# Patient Record
Sex: Female | Born: 1985 | Race: Black or African American | Hispanic: No | Marital: Married | Smoking: Never smoker
Health system: Southern US, Community
[De-identification: ages and names within clinical notes are randomized; demographics above are authoritative.]

## PROBLEM LIST (undated history)

## (undated) ENCOUNTER — Inpatient Hospital Stay (HOSPITAL_COMMUNITY): Payer: Self-pay

## (undated) DIAGNOSIS — F329 Major depressive disorder, single episode, unspecified: Secondary | ICD-10-CM

## (undated) DIAGNOSIS — F419 Anxiety disorder, unspecified: Secondary | ICD-10-CM

## (undated) DIAGNOSIS — E7800 Pure hypercholesterolemia, unspecified: Secondary | ICD-10-CM

## (undated) DIAGNOSIS — F32A Depression, unspecified: Secondary | ICD-10-CM

## (undated) DIAGNOSIS — Z8669 Personal history of other diseases of the nervous system and sense organs: Secondary | ICD-10-CM

## (undated) DIAGNOSIS — M9909 Segmental and somatic dysfunction of abdomen and other regions: Secondary | ICD-10-CM

## (undated) DIAGNOSIS — G4700 Insomnia, unspecified: Secondary | ICD-10-CM

## (undated) DIAGNOSIS — T7840XA Allergy, unspecified, initial encounter: Secondary | ICD-10-CM

## (undated) DIAGNOSIS — I10 Essential (primary) hypertension: Secondary | ICD-10-CM

## (undated) DIAGNOSIS — E119 Type 2 diabetes mellitus without complications: Secondary | ICD-10-CM

## (undated) DIAGNOSIS — D649 Anemia, unspecified: Secondary | ICD-10-CM

## (undated) DIAGNOSIS — J4520 Mild intermittent asthma, uncomplicated: Secondary | ICD-10-CM

## (undated) DIAGNOSIS — R0789 Other chest pain: Secondary | ICD-10-CM

## (undated) DIAGNOSIS — M549 Dorsalgia, unspecified: Secondary | ICD-10-CM

## (undated) HISTORY — DX: Mild intermittent asthma, uncomplicated: J45.20

## (undated) HISTORY — DX: Pure hypercholesterolemia, unspecified: E78.00

## (undated) HISTORY — PX: WISDOM TOOTH EXTRACTION: SHX21

## (undated) HISTORY — DX: Segmental and somatic dysfunction of abdomen and other regions: M99.09

## (undated) HISTORY — DX: Anxiety disorder, unspecified: F41.9

## (undated) HISTORY — DX: Depression, unspecified: F32.A

## (undated) HISTORY — DX: Other chest pain: R07.89

## (undated) HISTORY — DX: Allergy, unspecified, initial encounter: T78.40XA

## (undated) HISTORY — DX: Personal history of other diseases of the nervous system and sense organs: Z86.69

## (undated) HISTORY — DX: Dorsalgia, unspecified: M54.9

## (undated) HISTORY — DX: Essential (primary) hypertension: I10

## (undated) HISTORY — DX: Major depressive disorder, single episode, unspecified: F32.9

## (undated) HISTORY — DX: Anemia, unspecified: D64.9

## (undated) HISTORY — DX: Insomnia, unspecified: G47.00

---
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
## 2004-12-14 ENCOUNTER — Ambulatory Visit: Payer: Self-pay | Admitting: Pulmonary Disease

## 2005-10-16 ENCOUNTER — Emergency Department (HOSPITAL_COMMUNITY): Admission: EM | Admit: 2005-10-16 | Discharge: 2005-10-16 | Payer: Self-pay | Admitting: Family Medicine

## 2005-10-21 ENCOUNTER — Emergency Department (HOSPITAL_COMMUNITY): Admission: EM | Admit: 2005-10-21 | Discharge: 2005-10-21 | Payer: Self-pay | Admitting: Family Medicine

## 2005-12-15 ENCOUNTER — Ambulatory Visit: Payer: Self-pay | Admitting: Pulmonary Disease

## 2006-01-19 ENCOUNTER — Ambulatory Visit: Payer: Self-pay | Admitting: Pulmonary Disease

## 2006-04-22 ENCOUNTER — Emergency Department (HOSPITAL_COMMUNITY): Admission: EM | Admit: 2006-04-22 | Discharge: 2006-04-23 | Payer: Self-pay | Admitting: Emergency Medicine

## 2006-12-21 ENCOUNTER — Ambulatory Visit: Payer: Self-pay | Admitting: Pulmonary Disease

## 2006-12-21 LAB — CONVERTED CEMR LAB
ALT: 14 units/L (ref 0–35)
AST: 17 units/L (ref 0–37)
Albumin: 4 g/dL (ref 3.5–5.2)
Bilirubin Urine: NEGATIVE
Bilirubin, Direct: 0.1 mg/dL (ref 0.0–0.3)
Cholesterol: 203 mg/dL (ref 0–200)
Direct LDL: 121.3 mg/dL
HDL: 68.7 mg/dL (ref >39.0)
Hemoglobin, Urine: NEGATIVE
Hemoglobin: 12.9 g/dL (ref 12.0–15.0)
Ketones, ur: NEGATIVE mg/dL
Leukocytes, UA: NEGATIVE
Lymphocytes Relative: 35 % (ref 12.0–46.0)
MCV: 85.7 fL (ref 78.0–100.0)
Monocytes Relative: 10.5 % (ref 3.0–11.0)
Neutrophils Relative %: 51.9 % (ref 43.0–77.0)
Nitrite: NEGATIVE
Platelets: 255 10*3/uL (ref 150–400)
Potassium: 4.1 meq/L (ref 3.5–5.1)
RBC: 4.47 M/uL (ref 3.87–5.11)
RDW: 11.8 % (ref 11.5–14.6)
Sodium: 138 meq/L (ref 135–145)
Total Protein, Urine: NEGATIVE mg/dL
Total Protein: 7.1 g/dL (ref 6.0–8.3)
Triglycerides: 54 mg/dL (ref 0–149)
Urine Glucose: NEGATIVE mg/dL
VLDL: 11 mg/dL (ref 0–40)
pH: 7.5 (ref 5.0–8.0)

## 2007-03-29 DIAGNOSIS — G43909 Migraine, unspecified, not intractable, without status migrainosus: Secondary | ICD-10-CM | POA: Insufficient documentation

## 2007-03-30 ENCOUNTER — Ambulatory Visit: Payer: Self-pay | Admitting: Pulmonary Disease

## 2007-06-29 ENCOUNTER — Inpatient Hospital Stay (HOSPITAL_COMMUNITY): Admission: AD | Admit: 2007-06-29 | Discharge: 2007-06-30 | Payer: Self-pay | Admitting: Obstetrics

## 2007-07-21 ENCOUNTER — Ambulatory Visit (HOSPITAL_COMMUNITY): Admission: RE | Admit: 2007-07-21 | Discharge: 2007-07-21 | Payer: Self-pay | Admitting: Obstetrics

## 2007-09-14 ENCOUNTER — Inpatient Hospital Stay (HOSPITAL_COMMUNITY): Admission: AD | Admit: 2007-09-14 | Discharge: 2007-09-14 | Payer: Self-pay | Admitting: Obstetrics

## 2007-11-22 ENCOUNTER — Inpatient Hospital Stay (HOSPITAL_COMMUNITY): Admission: AD | Admit: 2007-11-22 | Discharge: 2007-11-22 | Payer: Self-pay | Admitting: Obstetrics

## 2007-12-12 ENCOUNTER — Inpatient Hospital Stay (HOSPITAL_COMMUNITY): Admission: AD | Admit: 2007-12-12 | Discharge: 2007-12-12 | Payer: Self-pay | Admitting: Obstetrics

## 2008-02-12 ENCOUNTER — Inpatient Hospital Stay (HOSPITAL_COMMUNITY): Admission: AD | Admit: 2008-02-12 | Discharge: 2008-02-13 | Payer: Self-pay | Admitting: Obstetrics

## 2008-02-17 ENCOUNTER — Inpatient Hospital Stay (HOSPITAL_COMMUNITY): Admission: AD | Admit: 2008-02-17 | Discharge: 2008-02-17 | Payer: Self-pay | Admitting: Obstetrics

## 2008-02-18 ENCOUNTER — Inpatient Hospital Stay (HOSPITAL_COMMUNITY): Admission: AD | Admit: 2008-02-18 | Discharge: 2008-02-20 | Payer: Self-pay | Admitting: Obstetrics

## 2008-03-06 ENCOUNTER — Inpatient Hospital Stay (HOSPITAL_COMMUNITY): Admission: AD | Admit: 2008-03-06 | Discharge: 2008-03-07 | Payer: Self-pay | Admitting: Obstetrics

## 2008-10-21 ENCOUNTER — Ambulatory Visit: Payer: Self-pay | Admitting: Pulmonary Disease

## 2008-10-27 DIAGNOSIS — E7800 Pure hypercholesterolemia, unspecified: Secondary | ICD-10-CM

## 2008-10-27 DIAGNOSIS — F341 Dysthymic disorder: Secondary | ICD-10-CM

## 2008-10-27 DIAGNOSIS — M549 Dorsalgia, unspecified: Secondary | ICD-10-CM

## 2008-10-27 DIAGNOSIS — J4520 Mild intermittent asthma, uncomplicated: Secondary | ICD-10-CM

## 2008-10-27 DIAGNOSIS — J309 Allergic rhinitis, unspecified: Secondary | ICD-10-CM | POA: Insufficient documentation

## 2008-10-27 DIAGNOSIS — M999 Biomechanical lesion, unspecified: Secondary | ICD-10-CM

## 2008-10-27 DIAGNOSIS — R0789 Other chest pain: Secondary | ICD-10-CM

## 2008-10-27 HISTORY — DX: Mild intermittent asthma, uncomplicated: J45.20

## 2008-10-27 LAB — CONVERTED CEMR LAB
ALT: 11 units/L (ref 0–35)
Albumin: 4.3 g/dL (ref 3.5–5.2)
Basophils Absolute: 0 10*3/uL (ref 0.0–0.1)
Basophils Relative: 0.4 % (ref 0.0–3.0)
CO2: 27 meq/L (ref 19–32)
Cholesterol: 226 mg/dL — ABNORMAL HIGH (ref 0–200)
Eosinophils Absolute: 0.1 10*3/uL (ref 0.0–0.7)
Eosinophils Relative: 1 % (ref 0.0–5.0)
GFR calc non Af Amer: 132.72 mL/min (ref >60)
Glucose, Bld: 89 mg/dL (ref 70–99)
HCT: 37.8 % (ref 36.0–46.0)
HDL: 67.8 mg/dL (ref >39.00)
Hemoglobin: 12.9 g/dL (ref 12.0–15.0)
Ketones, ur: NEGATIVE mg/dL
Lymphocytes Relative: 25.7 % (ref 12.0–46.0)
Lymphs Abs: 2.7 10*3/uL (ref 0.7–4.0)
MCHC: 34.2 g/dL (ref 30.0–36.0)
Monocytes Absolute: 1 10*3/uL (ref 0.1–1.0)
Monocytes Relative: 9.7 % (ref 3.0–12.0)
Neutrophils Relative %: 63.2 % (ref 43.0–77.0)
Nitrite: NEGATIVE
RDW: 11.9 % (ref 11.5–14.6)
Sodium: 133 meq/L — ABNORMAL LOW (ref 135–145)
Specific Gravity, Urine: >=1.03 (ref 1.000–1.030)
TSH: 1.18 microintl units/mL (ref 0.35–5.50)
Total CHOL/HDL Ratio: 3
Total Protein: 7.5 g/dL (ref 6.0–8.3)
Triglycerides: 72 mg/dL (ref 0.0–149.0)
Urine Glucose: NEGATIVE mg/dL
Urobilinogen, UA: 0.2 (ref 0.0–1.0)
WBC: 10.6 10*3/uL — ABNORMAL HIGH (ref 4.5–10.5)

## 2009-01-18 ENCOUNTER — Inpatient Hospital Stay (HOSPITAL_COMMUNITY): Admission: AD | Admit: 2009-01-18 | Discharge: 2009-01-19 | Payer: Self-pay | Admitting: Obstetrics

## 2009-02-08 ENCOUNTER — Inpatient Hospital Stay (HOSPITAL_COMMUNITY): Admission: AD | Admit: 2009-02-08 | Discharge: 2009-02-08 | Payer: Self-pay | Admitting: Obstetrics

## 2009-03-03 ENCOUNTER — Ambulatory Visit (HOSPITAL_COMMUNITY): Admission: RE | Admit: 2009-03-03 | Discharge: 2009-03-03 | Payer: Self-pay | Admitting: Obstetrics

## 2009-04-26 ENCOUNTER — Ambulatory Visit: Payer: Self-pay | Admitting: Advanced Practice Midwife

## 2009-04-26 ENCOUNTER — Inpatient Hospital Stay (HOSPITAL_COMMUNITY): Admission: AD | Admit: 2009-04-26 | Discharge: 2009-04-26 | Payer: Self-pay | Admitting: Obstetrics

## 2009-05-13 ENCOUNTER — Inpatient Hospital Stay (HOSPITAL_COMMUNITY): Admission: AD | Admit: 2009-05-13 | Discharge: 2009-05-13 | Payer: Self-pay | Admitting: Obstetrics

## 2009-05-25 ENCOUNTER — Inpatient Hospital Stay (HOSPITAL_COMMUNITY): Admission: AD | Admit: 2009-05-25 | Discharge: 2009-05-25 | Payer: Self-pay | Admitting: Obstetrics

## 2009-05-25 ENCOUNTER — Ambulatory Visit: Payer: Self-pay | Admitting: Advanced Practice Midwife

## 2009-06-04 ENCOUNTER — Inpatient Hospital Stay (HOSPITAL_COMMUNITY): Admission: AD | Admit: 2009-06-04 | Discharge: 2009-06-05 | Payer: Self-pay | Admitting: Obstetrics

## 2009-06-26 ENCOUNTER — Inpatient Hospital Stay (HOSPITAL_COMMUNITY): Admission: AD | Admit: 2009-06-26 | Discharge: 2009-06-27 | Payer: Self-pay | Admitting: Obstetrics

## 2009-06-27 ENCOUNTER — Inpatient Hospital Stay (HOSPITAL_COMMUNITY): Admission: AD | Admit: 2009-06-27 | Discharge: 2009-06-27 | Payer: Self-pay | Admitting: Obstetrics

## 2009-06-28 ENCOUNTER — Inpatient Hospital Stay (HOSPITAL_COMMUNITY): Admission: AD | Admit: 2009-06-28 | Discharge: 2009-06-30 | Payer: Self-pay | Admitting: Obstetrics

## 2009-07-30 ENCOUNTER — Emergency Department (HOSPITAL_COMMUNITY): Admission: EM | Admit: 2009-07-30 | Discharge: 2009-07-30 | Payer: Self-pay | Admitting: Emergency Medicine

## 2009-07-31 ENCOUNTER — Telehealth (INDEPENDENT_AMBULATORY_CARE_PROVIDER_SITE_OTHER): Payer: Self-pay | Admitting: *Deleted

## 2009-08-04 ENCOUNTER — Telehealth: Payer: Self-pay | Admitting: Pulmonary Disease

## 2009-08-11 ENCOUNTER — Encounter: Payer: Self-pay | Admitting: Pulmonary Disease

## 2009-08-11 ENCOUNTER — Telehealth (INDEPENDENT_AMBULATORY_CARE_PROVIDER_SITE_OTHER): Payer: Self-pay | Admitting: *Deleted

## 2009-08-12 ENCOUNTER — Encounter: Payer: Self-pay | Admitting: Pulmonary Disease

## 2009-09-25 ENCOUNTER — Telehealth: Payer: Self-pay | Admitting: Pulmonary Disease

## 2009-09-25 ENCOUNTER — Telehealth (INDEPENDENT_AMBULATORY_CARE_PROVIDER_SITE_OTHER): Payer: Self-pay | Admitting: *Deleted

## 2009-09-25 ENCOUNTER — Encounter: Payer: Self-pay | Admitting: Pulmonary Disease

## 2009-09-29 ENCOUNTER — Ambulatory Visit: Payer: Self-pay | Admitting: Pulmonary Disease

## 2009-09-29 DIAGNOSIS — I10 Essential (primary) hypertension: Secondary | ICD-10-CM | POA: Insufficient documentation

## 2009-11-03 ENCOUNTER — Ambulatory Visit: Payer: Self-pay | Admitting: Pulmonary Disease

## 2009-11-20 ENCOUNTER — Ambulatory Visit: Payer: Self-pay | Admitting: Pulmonary Disease

## 2009-11-24 DIAGNOSIS — R21 Rash and other nonspecific skin eruption: Secondary | ICD-10-CM | POA: Insufficient documentation

## 2009-12-31 ENCOUNTER — Ambulatory Visit: Payer: Self-pay | Admitting: Pulmonary Disease

## 2010-01-03 DIAGNOSIS — K5900 Constipation, unspecified: Secondary | ICD-10-CM | POA: Insufficient documentation

## 2010-01-03 LAB — CONVERTED CEMR LAB
AST: 17 units/L (ref 0–37)
Basophils Absolute: 0 10*3/uL (ref 0.0–0.1)
Calcium: 9.3 mg/dL (ref 8.4–10.5)
Cholesterol: 212 mg/dL — ABNORMAL HIGH (ref 0–200)
Creatinine, Ser: 0.6 mg/dL (ref 0.4–1.2)
Eosinophils Absolute: 0.1 10*3/uL (ref 0.0–0.7)
Eosinophils Relative: 1 % (ref 0.0–5.0)
GFR calc non Af Amer: 148.38 mL/min (ref >60)
Glucose, Bld: 85 mg/dL (ref 70–99)
Ketones, ur: NEGATIVE mg/dL
Lymphocytes Relative: 30.5 % (ref 12.0–46.0)
Monocytes Absolute: 0.8 10*3/uL (ref 0.1–1.0)
Monocytes Relative: 8.6 % (ref 3.0–12.0)
Neutro Abs: 5.2 10*3/uL (ref 1.4–7.7)
Nitrite: NEGATIVE
RBC: 4.3 M/uL (ref 3.87–5.11)
RDW: 12.9 % (ref 11.5–14.6)
Specific Gravity, Urine: 1.02 (ref 1.000–1.030)
Total Protein, Urine: NEGATIVE mg/dL
Total Protein: 7.2 g/dL (ref 6.0–8.3)
Urobilinogen, UA: 0.2 (ref 0.0–1.0)
pH: 6.5 (ref 5.0–8.0)

## 2010-05-20 ENCOUNTER — Encounter: Payer: Self-pay | Admitting: Pulmonary Disease

## 2010-05-31 ENCOUNTER — Encounter: Payer: Self-pay | Admitting: Obstetrics
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
## 2010-06-09 NOTE — Progress Notes (Signed)
Summary: note / return to work  Phone Note Call from Patient   Caller: Mom Call For: Megan Hunt Summary of Call: pt's mom needs to speak to nurse re: note for work. since pt has a return visit w/ sn shd'd for mond- should pt stay out at least until next tuesday? mom says she will need a new note to take to HR at work if this is the case. call Richardo Hanks at [phone] Initial call taken by: Tivis Ringer, CNA,  Sep 25, 2009 3:10 PM  Follow-up for Phone Call        Pt is schedueld to see Sn on Monday in regards to high BP and pt mother is asking for work note to keep pt out of work until Tuesday, after appt with SN.  Pt was given a note from [facility] clinic that last through today, but since pt cannot see SN until mOnday mother rquesting an extension. Pelase advise.[name] CMA  Sep 25, 2009 3:15 PM   Additional Follow-up for Phone Call Additional follow up Details #1::        per SN---ok for note to extend out of work until appt on tuesday.  this has been printed and her mother is aware of note being left up front for pick up. Randell Loop CMA  Sep 25, 2009 4:41 PM
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
## 2010-06-09 NOTE — Progress Notes (Signed)
Summary: med not helping headaches  Phone Note Call from Patient   Caller: mother-[phone] Call For: Estevan Kersh Reason for Call: Talk to Nurse Summary of Call: Dr. Kriste Basque prescribed a med last week for headaches.  Patient's mother calling today saying the med is not helping her headaches and needs something stronger.  [facility] [address] Initial call taken by: Lehman Prom,  August 04, 2009 12:20 PM  Follow-up for Phone Call        dr Kriste Basque gave tramadol for headaches since midrin is no longer available, pls advise if there is anything else pt states the tramadol is not working Follow-up by: Philipp Deputy CMA,  August 04, 2009 1:48 PM  Additional Follow-up for Phone Call Additional follow up Details #1::        per SN---call in vicodin 5/500mg   #50  1 by mouth every 6 hours as needed for pain   she will need appt with [facility] clinic for eval---this is mandatory so they can follow up with her ha.  thanks Randell Loop CMA  August 04, 2009 2:36 PM   rx called in and order palced for HA clinic referral. pt mother advised. [name] CMA  August 04, 2009 2:45 PM     New/Updated Medications: VICODIN 5-500 MG TABS (HYDROCODONE-ACETAMINOPHEN) one every 6 hours as needed for pain Prescriptions: VICODIN 5-500 MG TABS (HYDROCODONE-ACETAMINOPHEN) one every 6 hours as needed for pain  #50 x 0   Entered by:   [name] CMA   Authorized by:   Michele Mcalpine MD   Signed by:   [name] CMA on 08/04/2009   Method used:   Telephoned to ...       [facility] [id number] (retail)       [address] [place], [place]  [address]       Ph: [phone]       Fax: [phone]   RxID:   [id number]

## 2010-06-09 NOTE — Letter (Signed)
Summary: Out of Work  [employer]  [address]   [place], [place] [address]   Phone: [phone]  Fax: [phone]    August 12, 2009   Employee:  Abbott Pao    To Whom It May Concern:   For Medical reasons, please excuse the above named employee from work for the following dates:  Start:   Monday August 11, 2009  End:   Thursday Sep 25, 2009  If you need additional information, please feel free to contact our office.         Sincerely,    Alroy Dust, M.D.
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
## 2010-06-09 NOTE — Letter (Signed)
Summary: Out of Work  [employer]  [address]   [place], [place] [address]   Phone: [phone]  Fax: [phone]    Sep 29, 2009   Employee:  DASHANTI BURR    To Whom It May Concern:   For Medical reasons, please excuse the above named employee from work for the following dates:  Start:   09-29-2009  End:   09-30-2009    Ok to return to work from her LOA on 09-30-2009.         Sincerely,       Lonzo Cloud. Elayne Snare. D.

## 2010-06-09 NOTE — Progress Notes (Signed)
Summary: appointment  Phone Note Call from Patient Call back at [phone]   Caller: mother Call For: nadel Summary of Call: pt would like ov with dr Kriste Basque for elevated b/p does not want to see tammy Initial call taken by: Rickard Patience,  Sep 25, 2009 11:31 AM  Follow-up for Phone Call        Pt has had HA medication increased by [facility] clinic, and they are requiring her to be on BCP while on the increased dose because she cannot get pregnant while on this medication. But her BP was elevated so they want her to f/u with SN first to address increased BP before her OB/GYN will place her on birth control. Please advise on work in slot. Thanks. [name] CMA  Sep 25, 2009 11:54 AM   Additional Follow-up for Phone Call Additional follow up Details #1::        SN has an opening on 5-23 at 2:30 if she wants to come in that day.  thanks Randell Loop CMA  Sep 25, 2009 1:11 PM   Spoke with the pt's mother and she will inform pt of appt on Monday, 09/29/2009 @ 2:30pm.Lori Excell Seltzer CMA  Sep 25, 2009 1:37 PM

## 2010-06-09 NOTE — Progress Notes (Signed)
Summary: migraines/ rx request  Phone Note Call from Patient   Caller: Mom Call For: nadel Summary of Call: per pt's mom janice, pt c/o migraines x 2 wks. pt went to ed and got a shot for this. it eased off but has returned today. requests a rx for this. says dr Kriste Basque gave pt meds for this "sometime bacK". pt just had a baby a months ago- also has a 72 month old. [facility] on [address] and [address]. janice at [phone] Initial call taken by: Tivis Ringer, CNA,  July 31, 2009 9:52 AM  Follow-up for Phone Call        no migraine meds listed in pt's current med list.  Please advise.  Thank you.  Aundra Millet Reynolds LPN  July 31, 2009 1:47 PM   per SN---  Additional Follow-up for Phone Call Additional follow up Details #1::        per SN---she has the alprazolam that she can use for her nerves and the muscle spasms---midrin worked for her in the past but this is no longer avaliable.  therefore SN rec.---tramadol 50mg   1 by mouth three times a day as needed for pain  #50 with 5 refill.  she can take the tylenol 2 tabs with the tramadol.  thanks Randell Loop CMA  July 31, 2009 3:53 PM     Additional Follow-up for Phone Call Additional follow up Details #2::    Spoke with pt's mother and advised of SN's recs.  Rx refill for alprazolam was called in as well as new rx for tramadol. Follow-up by: Vernie Murders,  July 31, 2009 4:52 PM  New/Updated Medications: TRAMADOL HCL 50 MG TABS (TRAMADOL HCL) 1 by mouth three times a day as needed for pain Prescriptions: TRAMADOL HCL 50 MG TABS (TRAMADOL HCL) 1 by mouth three times a day as needed for pain  #50 x 5   Entered by:   Vernie Murders   Authorized by:   Michele Mcalpine MD   Signed by:   Vernie Murders on 07/31/2009   Method used:   Telephoned to ...       [facility] [id number]* (retail)       [address] [place], [place]  [address]       Ph: [phone]       Fax: [phone]   RxID:   [id number] ALPRAZOLAM 0.5 MG  TABS  (ALPRAZOLAM) take 1/2 to 1 tab by mouth three times a day as needed for nerves...  #90 x 0   Entered by:   Vernie Murders   Authorized by:   Michele Mcalpine MD   Signed by:   Vernie Murders on 07/31/2009   Method used:   Telephoned to ...       [facility] [id number]* (retail)       [address] [place], [place]  [address]       Ph: [phone]       Fax: [phone]   RxID:   [id number]

## 2010-06-09 NOTE — Letter (Signed)
Summary: Out of Work  [facility]  [address]   [place], [place] [address]   Phone: [phone]  Fax: [phone]    Sep 25, 2009   Employee:  Megan Hunt    To Whom It May Concern:   For Medical reasons, please excuse the above named employee from work for the following dates:  Start:   09-25-2009  End:   09-30-2009  If you need additional information, please feel free to contact our office.         Sincerely,       Lonzo Cloud. Elayne Snare . D.

## 2010-06-09 NOTE — Letter (Signed)
Summary: [facility]  [facility]   Imported By: Sherian Rein 08/20/2009 12:01:02  _____________________________________________________________________  External Attachment:    Type:   Image     Comment:   External Document

## 2010-06-09 NOTE — Progress Notes (Signed)
Summary: speak to nurse-Letter out of work  Phone Note Call from Patient   Caller: Mom Call For: nadel Summary of Call: pt's mom janice wants to speak to nurse. has some questions mom was told to ask since pt was seen by neurologist. Liborio Nixon [phone] Initial call taken by: Tivis Ringer, CNA,  August 11, 2009 4:26 PM  Follow-up for Phone Call        ATC number given, no answer and unable to leave message, states mailbox not set up. WCB. [name] CMA  August 11, 2009 4:41 PM  pt called back, asked that nurse call her back.  Will keep cell phone with her.  Follow-up by: Eugene Gavia,  August 11, 2009 4:53 PM  Additional Follow-up for Phone Call Additional follow up Details #1::        called and spoke with pt's mother, Liborio Nixon.  Liborio Nixon states pt was just seen today by [facility].  They have started her on Topamax and Baclofen.  However, d/t the side effects of these meds ( drowsiness, dizziness, weakness, difficulty concentrating, tingling/numbness in hands and feet) they recommend pt be out of work for at least 1 month until she can get used to the medications.  Mother states the [facility] states they do not write notes for pt to be out of work- this is to be done through [facility].  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  Pt's next appt with [facility] is 09-25-2009 and pt requests to be out of work until her next appt with them.  Please advise.  Thanks.  Aundra Millet Reynolds LPN  August 12, 4538 5:07 PM     Additional Follow-up for Phone Call Additional follow up Details #2::    per SN---we can write the note for her to be out of work for this time period.  thanks Randell Loop CMA  August 12, 2009 8:53 AM   printed letter.  called and spoke with pt's mother and informed her work note ready.  mother will pick up letter today.  Aundra Millet Reynolds LPN  August 12, 9809 9:16 AM
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
## 2010-06-25 NOTE — Letter (Signed)
Summary: [facility]   Imported By: Sherian Rein 06/15/2010 09:59:30  _____________________________________________________________________  External Attachment:    Type:   Image     Comment:   External Document

## 2010-07-26 LAB — URINALYSIS, ROUTINE W REFLEX MICROSCOPIC
Nitrite: NEGATIVE
Protein, ur: NEGATIVE mg/dL
Urobilinogen, UA: 0.2 mg/dL (ref 0.0–1.0)

## 2010-07-29 LAB — CBC
HCT: 32 % — ABNORMAL LOW (ref 36.0–46.0)
HCT: 34.5 % — ABNORMAL LOW (ref 36.0–46.0)
Hemoglobin: 10.6 g/dL — ABNORMAL LOW (ref 12.0–15.0)
Hemoglobin: 11.4 g/dL — ABNORMAL LOW (ref 12.0–15.0)
MCV: 87.2 fL (ref 78.0–100.0)
RBC: 3.64 MIL/uL — ABNORMAL LOW (ref 3.87–5.11)
RDW: 13.4 % (ref 11.5–15.5)
WBC: 13 10*3/uL — ABNORMAL HIGH (ref 4.0–10.5)

## 2010-07-29 LAB — COMPREHENSIVE METABOLIC PANEL
ALT: 13 U/L (ref 0–35)
AST: 17 U/L (ref 0–37)
Alkaline Phosphatase: 190 U/L — ABNORMAL HIGH (ref 39–117)
CO2: 21 mEq/L (ref 19–32)
Chloride: 105 mEq/L (ref 96–112)
GFR calc Af Amer: >60 mL/min (ref >60)
GFR calc non Af Amer: >60 mL/min (ref >60)
Glucose, Bld: 106 mg/dL — ABNORMAL HIGH (ref 70–99)
Potassium: 3.8 mEq/L (ref 3.5–5.1)
Total Protein: 5.8 g/dL — ABNORMAL LOW (ref 6.0–8.3)

## 2010-07-29 LAB — RPR: RPR Ser Ql: NONREACTIVE

## 2010-07-29 LAB — LACTATE DEHYDROGENASE: LDH: 125 U/L (ref 94–250)

## 2010-08-10 LAB — GC/CHLAMYDIA PROBE AMP, GENITAL
Chlamydia, DNA Probe: NEGATIVE
GC Probe Amp, Genital: NEGATIVE

## 2010-08-10 LAB — URINALYSIS, ROUTINE W REFLEX MICROSCOPIC: Urobilinogen, UA: 0.2 mg/dL (ref 0.0–1.0)

## 2010-08-13 LAB — DIFFERENTIAL
Eosinophils Relative: 1 % (ref 0–5)
Lymphocytes Relative: 21 % (ref 12–46)
Monocytes Absolute: 1.2 10*3/uL — ABNORMAL HIGH (ref 0.1–1.0)
Monocytes Relative: 8 % (ref 3–12)
Neutro Abs: 10 10*3/uL — ABNORMAL HIGH (ref 1.7–7.7)
Neutrophils Relative %: 69 % (ref 43–77)

## 2010-08-13 LAB — COMPREHENSIVE METABOLIC PANEL
AST: 21 U/L (ref 0–37)
Alkaline Phosphatase: 91 U/L (ref 39–117)
BUN: 5 mg/dL — ABNORMAL LOW (ref 6–23)
Calcium: 9.3 mg/dL (ref 8.4–10.5)
Chloride: 109 mEq/L (ref 96–112)
GFR calc Af Amer: >60 mL/min (ref >60)
GFR calc non Af Amer: >60 mL/min (ref >60)
Glucose, Bld: 101 mg/dL — ABNORMAL HIGH (ref 70–99)
Potassium: 3.2 mEq/L — ABNORMAL LOW (ref 3.5–5.1)
Sodium: 135 mEq/L (ref 135–145)

## 2010-08-13 LAB — CBC
Hemoglobin: 11.6 g/dL — ABNORMAL LOW (ref 12.0–15.0)
MCHC: 33.6 g/dL (ref 30.0–36.0)
RBC: 4 MIL/uL (ref 3.87–5.11)
RDW: 12.9 % (ref 11.5–15.5)

## 2010-08-14 LAB — URINE CULTURE

## 2010-08-14 LAB — CBC
HCT: 32.6 % — ABNORMAL LOW (ref 36.0–46.0)
Platelets: 235 10*3/uL (ref 150–400)
RDW: 13 % (ref 11.5–15.5)
WBC: 13 10*3/uL — ABNORMAL HIGH (ref 4.0–10.5)

## 2010-08-14 LAB — URINALYSIS, ROUTINE W REFLEX MICROSCOPIC
Glucose, UA: NEGATIVE mg/dL
Nitrite: NEGATIVE
Specific Gravity, Urine: 1.025 (ref 1.005–1.030)
pH: 5.5 (ref 5.0–8.0)

## 2010-08-14 LAB — URINE MICROSCOPIC-ADD ON: RBC / HPF: NONE SEEN RBC/hpf (ref <3)

## 2010-09-22 NOTE — H&P (Signed)
NAME:  Megan Hunt, Megan Hunt NO.:  [id number]   MEDICAL RECORD NO.:  [id number]          PATIENT TYPE:  INP   LOCATION:  9168                          FACILITY:  [facility]   PHYSICIAN:  Roseanna Rainbow, M.D.DATE OF BIRTH:  12/23/1985   DATE OF ADMISSION:  02/18/2008  DATE OF DISCHARGE:                              HISTORY & PHYSICAL   CHIEF COMPLAINT:  The patient is a 25 year old para 0 with an estimated  date of confinement of February 22, 2008 with an intrauterine pregnancy  at 39 plus weeks complaining of bloody show and leaking fluid.   HISTORY OF PRESENT ILLNESS:  Please see the above.  The patient reports  onset of leaking of fluid for several hours prior to presentation.  The  fluid is clear.  She has regular contractions.   SOCIAL HISTORY:  She is single.  She denies any tobacco or ethanol use.   ALLERGIES:  No known drug allergies.   GYNECOLOGIC HISTORY:  Normal triad.   PAST MEDICAL HISTORY:  Asthma.   PAST SURGICAL HISTORY:  She denies.   FAMILY HISTORY:  Noncontributory.   OB RISK FACTORS:  Asthma.  She did have some labile blood pressures 130s-  150s/70s.   PRENATAL LABORATORY DATA:  Hemoglobin 11.9, hematocrit 36.4, platelets  261,000, blood type B+, antibody screen negative, sickle cell negative,  RPR nonreactive, rubella immune, hepatitis B surface antigen negative,  HIV nonreactive, PPD negative, Pap test negative, GC negative, chlamydia  negative.  The quad screen is unclear if it was performed.  The results  are not on the chart.  A 1-hour GTT 108.  GBS negative on January 17, 2008.  Early ultrasound with an estimated date of confinement of February 27, 2008 at every 3 days.  Ultrasound at 20 weeks 5 days, posterior  placenta, no previa.   REVIEW OF SYSTEMS:  GU:  Please see the above.   PHYSICAL EXAMINATION:  VITAL SIGNS:  Stable, afebrile.  Fetal heart  tracing reassuring.  Tocodynamometer irregular uterine contractions.  Sterile  vaginal exam  per the RN, there is ferning.  There is no blood appreciated.  Nitrazine  is positive as well.  Cervix is 1 cm dilated.   ASSESSMENT:  Primigravida at term, prodromal labor.  Fetal heart tracing  consistent with fetal well being.   PLAN:  Admission.  Augment labor with low-dose Pitocin per protocol.  Monitor progress.      Roseanna Rainbow, M.D.  Electronically Signed     LAJ/MEDQ  D:  02/18/2008  T:  02/18/2008  Job:  [id number]

## 2010-09-29 ENCOUNTER — Telehealth: Payer: Self-pay | Admitting: Pulmonary Disease

## 2010-09-29 MED ORDER — ALBUTEROL SULFATE HFA 108 (90 BASE) MCG/ACT IN AERS: 2.0000 | INHALATION_SPRAY | Freq: Four times a day (QID) | RESPIRATORY_TRACT | Status: DC | PRN

## 2010-09-29 NOTE — Telephone Encounter (Signed)
Spoke with pt's mother Liborio Nixon. She states pt needs refill on proventil. Rx was sent to pharm. Nothing further needed.

## 2011-01-04 ENCOUNTER — Encounter: Payer: Self-pay | Admitting: Pulmonary Disease

## 2011-01-05 ENCOUNTER — Ambulatory Visit (INDEPENDENT_AMBULATORY_CARE_PROVIDER_SITE_OTHER): Payer: BC Managed Care – PPO | Admitting: Pulmonary Disease

## 2011-01-05 ENCOUNTER — Encounter: Payer: Self-pay | Admitting: Pulmonary Disease

## 2011-01-05 ENCOUNTER — Other Ambulatory Visit (INDEPENDENT_AMBULATORY_CARE_PROVIDER_SITE_OTHER): Payer: BC Managed Care – PPO

## 2011-01-05 VITALS — BP 142/78 | HR 91 | Temp 97.8°F | Ht 65.0 in | Wt 170.6 lb

## 2011-01-05 DIAGNOSIS — Z0000 Encounter for general adult medical examination without abnormal findings: Secondary | ICD-10-CM

## 2011-01-05 DIAGNOSIS — I10 Essential (primary) hypertension: Secondary | ICD-10-CM

## 2011-01-05 DIAGNOSIS — M999 Biomechanical lesion, unspecified: Secondary | ICD-10-CM

## 2011-01-05 DIAGNOSIS — R0789 Other chest pain: Secondary | ICD-10-CM

## 2011-01-05 DIAGNOSIS — F341 Dysthymic disorder: Secondary | ICD-10-CM

## 2011-01-05 DIAGNOSIS — IMO0001 Reserved for inherently not codable concepts without codable children: Secondary | ICD-10-CM

## 2011-01-05 DIAGNOSIS — J45909 Unspecified asthma, uncomplicated: Secondary | ICD-10-CM

## 2011-01-05 DIAGNOSIS — E7800 Pure hypercholesterolemia, unspecified: Secondary | ICD-10-CM

## 2011-01-05 DIAGNOSIS — Z87898 Personal history of other specified conditions: Secondary | ICD-10-CM

## 2011-01-05 DIAGNOSIS — K5900 Constipation, unspecified: Secondary | ICD-10-CM

## 2011-01-05 DIAGNOSIS — M797 Fibromyalgia: Secondary | ICD-10-CM | POA: Insufficient documentation

## 2011-01-05 DIAGNOSIS — K644 Residual hemorrhoidal skin tags: Secondary | ICD-10-CM

## 2011-01-05 LAB — URINALYSIS
Leukocytes, UA: NEGATIVE
Nitrite: NEGATIVE
Specific Gravity, Urine: 1.02 (ref 1.000–1.030)
Urobilinogen, UA: 0.2 (ref 0.0–1.0)
pH: 6.5 (ref 5.0–8.0)

## 2011-01-05 LAB — CBC WITH DIFFERENTIAL/PLATELET
Basophils Absolute: 0 10*3/uL (ref 0.0–0.1)
Eosinophils Relative: 1.2 % (ref 0.0–5.0)
Hemoglobin: 12.6 g/dL (ref 12.0–15.0)
Lymphocytes Relative: 27.5 % (ref 12.0–46.0)
Monocytes Relative: 10.4 % (ref 3.0–12.0)
Platelets: 226 10*3/uL (ref 150.0–400.0)
RDW: 12.9 % (ref 11.5–14.6)
WBC: 8.5 10*3/uL (ref 4.5–10.5)

## 2011-01-05 LAB — HEPATIC FUNCTION PANEL
AST: 16 U/L (ref 0–37)
Albumin: 4.4 g/dL (ref 3.5–5.2)
Alkaline Phosphatase: 111 U/L (ref 39–117)
Total Protein: 7.7 g/dL (ref 6.0–8.3)

## 2011-01-05 LAB — TSH: TSH: 0.85 u[IU]/mL (ref 0.35–5.50)

## 2011-01-05 LAB — LIPID PANEL
Cholesterol: 198 mg/dL (ref 0–200)
HDL: 61.4 mg/dL (ref >39.00)
VLDL: 12.4 mg/dL (ref 0.0–40.0)

## 2011-01-05 LAB — BASIC METABOLIC PANEL
BUN: 10 mg/dL (ref 6–23)
CO2: 25 mEq/L (ref 19–32)
Calcium: 9.1 mg/dL (ref 8.4–10.5)
GFR: 110.13 mL/min (ref >60.00)
Glucose, Bld: 88 mg/dL (ref 70–99)
Sodium: 140 mEq/L (ref 135–145)

## 2011-01-05 MED ORDER — ALPRAZOLAM 0.5 MG PO TABS: ORAL_TABLET | ORAL | Status: DC

## 2011-01-05 MED ORDER — ALBUTEROL SULFATE HFA 108 (90 BASE) MCG/ACT IN AERS: 2.0000 | INHALATION_SPRAY | Freq: Four times a day (QID) | RESPIRATORY_TRACT | Status: DC | PRN

## 2011-01-05 MED ORDER — ZOLPIDEM TARTRATE 10 MG PO TABS: ORAL_TABLET | ORAL | Status: DC

## 2011-01-05 NOTE — Patient Instructions (Signed)
Today we updated your med list in EPIC...    We refilled your meds & added ZOLPIDEM 10mg  to try 1/2 to 1 tab at bedtime for sleep...  You should get into a good exercise program> stretching etc on a daily basis...  For the aches & pains you may apply hot compresses & use Tylenol as needed...  Today we did your follow up fasting blood work...    Please call the PHONE TREE in a few days for your results...    Dial [id number] & when prompted enter your patient number followed by the # symbol...    Your patient number is:  [id number]  Call for any questions.Marland KitchenMarland Kitchen
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
## 2011-01-07 ENCOUNTER — Encounter: Payer: Self-pay | Admitting: Pulmonary Disease

## 2011-02-01 LAB — WET PREP, GENITAL
Clue Cells Wet Prep HPF POC: NONE SEEN
Trich, Wet Prep: NONE SEEN
Yeast Wet Prep HPF POC: NONE SEEN

## 2011-02-01 LAB — URINE MICROSCOPIC-ADD ON

## 2011-02-01 LAB — URINALYSIS, ROUTINE W REFLEX MICROSCOPIC
Nitrite: NEGATIVE
Protein, ur: NEGATIVE
Urobilinogen, UA: 0.2

## 2011-02-01 LAB — CBC
HCT: 36
Hemoglobin: 12.2
MCHC: 34
RBC: 4.22

## 2011-02-01 LAB — ABO/RH: ABO/RH(D): A POS

## 2011-02-01 LAB — POCT PREGNANCY, URINE: Preg Test, Ur: POSITIVE

## 2011-02-05 LAB — CBC
MCHC: 33.1
MCV: 89.2
Platelets: 241
WBC: 17 — ABNORMAL HIGH

## 2011-02-05 LAB — COMPREHENSIVE METABOLIC PANEL
AST: 21
Albumin: 3 — ABNORMAL LOW
Calcium: 9.2
Chloride: 103
Creatinine, Ser: 0.51
GFR calc Af Amer: >60

## 2011-02-08 LAB — CBC
HCT: 33.4 — ABNORMAL LOW
HCT: 38.5
Hemoglobin: 11.2 — ABNORMAL LOW
Hemoglobin: 13.5
MCHC: 32.7
MCHC: 33.5
MCHC: 35.1
MCV: 85.7
MCV: 87.9
MCV: 88.9
Platelets: 183
Platelets: 272
RBC: 3.8 — ABNORMAL LOW
RBC: 4.49
RDW: 12.1
RDW: 12.6
RDW: 13.1
WBC: 15.8 — ABNORMAL HIGH
WBC: 8.8

## 2011-02-08 LAB — COMPREHENSIVE METABOLIC PANEL
ALT: 25
AST: 23
Albumin: 3.7
CO2: 25
Calcium: 9.1
Chloride: 105
Creatinine, Ser: 0.76
GFR calc Af Amer: >60
GFR calc non Af Amer: >60
Sodium: 138

## 2011-08-24 ENCOUNTER — Telehealth: Payer: Self-pay | Admitting: Pulmonary Disease

## 2011-08-24 NOTE — Telephone Encounter (Signed)
Per last ov note, pt was to return in 1 year.  Since patient needs to be seen, please advise when pt can be seen.  SN's first available is in June.  Thanks.

## 2011-08-24 NOTE — Telephone Encounter (Signed)
Called, spoke with pt's mom, Megan Hunt.  States pt needs rx for alprazolam 0.5mg  -- requesting to pick this up.  Rx was last given on 01/05/11 #90 x 5.  Last OV with SN 01/05/11.  Dr. Kriste Basque, pls advise if rx is ok.  Thank you.

## 2011-08-24 NOTE — Telephone Encounter (Signed)
Ok to use one of the slots on 4/30 for this appt.  Will have to come in every 6 months for the rx for this med.  thanks

## 2011-08-24 NOTE — Telephone Encounter (Signed)
I spoke with mother and pt is scheduled to come in 09/07/11 at 10:30. Nothing further was needed

## 2011-08-24 NOTE — Telephone Encounter (Signed)
Pt needs ov every 6 month with SN to have refills of this med.  thanks

## 2011-09-07 ENCOUNTER — Encounter: Payer: Self-pay | Admitting: Pulmonary Disease

## 2011-09-07 ENCOUNTER — Ambulatory Visit (INDEPENDENT_AMBULATORY_CARE_PROVIDER_SITE_OTHER): Payer: Self-pay | Admitting: Pulmonary Disease

## 2011-09-07 VITALS — BP 138/86 | HR 87 | Temp 96.9°F | Ht 65.0 in | Wt 169.6 lb

## 2011-09-07 DIAGNOSIS — I10 Essential (primary) hypertension: Secondary | ICD-10-CM

## 2011-09-07 DIAGNOSIS — IMO0001 Reserved for inherently not codable concepts without codable children: Secondary | ICD-10-CM

## 2011-09-07 DIAGNOSIS — J45909 Unspecified asthma, uncomplicated: Secondary | ICD-10-CM

## 2011-09-07 DIAGNOSIS — J309 Allergic rhinitis, unspecified: Secondary | ICD-10-CM

## 2011-09-07 DIAGNOSIS — K5900 Constipation, unspecified: Secondary | ICD-10-CM

## 2011-09-07 DIAGNOSIS — F341 Dysthymic disorder: Secondary | ICD-10-CM

## 2011-09-07 DIAGNOSIS — M797 Fibromyalgia: Secondary | ICD-10-CM

## 2011-09-07 DIAGNOSIS — E7800 Pure hypercholesterolemia, unspecified: Secondary | ICD-10-CM

## 2011-09-07 DIAGNOSIS — Z87898 Personal history of other specified conditions: Secondary | ICD-10-CM

## 2011-09-07 MED ORDER — ALPRAZOLAM 0.5 MG PO TABS: ORAL_TABLET | ORAL | Status: DC
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
## 2011-09-07 NOTE — Patient Instructions (Signed)
Today we updated your med list in our EPIC system...    Continue your current medications the same...    We refilled your Alprazolam per request...  Try a gradual exercise program to include stretching & walking...  Concentrate on getting a good night's sleep...  For your allergies:    Try the OTC antihistamine like Claritin, Zyrtek, Allegra daily..    And use a SALINE (salt water) nasal mist every 1-2H as needed...  Call for any questions.Marland KitchenMarland Kitchen

## 2012-05-10 NOTE — L&D Delivery Note (Signed)
Delivery Note At 11:25 AM a viable female was delivered via Vaginal, Spontaneous Delivery (Presentation: ;  ).  APGAR: , ; weight .   Placenta status: , .  Cord:  with the following complications: .  Cord pH: not done  Anesthesia:   Episiotomy:  Lacerations:  Suture Repair: 2.0 Est. Blood Loss (mL):   Mom to postpartum.  Baby to Couplet care / Skin to Skin.  Rajiv Parlato A 03/22/2013, 11:35 AM

## 2012-08-20 ENCOUNTER — Ambulatory Visit: Payer: Self-pay | Admitting: Family Medicine

## 2012-08-20 VITALS — BP 141/73 | HR 78 | Temp 97.9°F | Resp 16 | Ht 64.5 in | Wt 180.0 lb

## 2012-08-20 DIAGNOSIS — N912 Amenorrhea, unspecified: Secondary | ICD-10-CM

## 2012-08-20 DIAGNOSIS — Z3490 Encounter for supervision of normal pregnancy, unspecified, unspecified trimester: Secondary | ICD-10-CM

## 2012-08-20 DIAGNOSIS — Z331 Pregnant state, incidental: Secondary | ICD-10-CM

## 2012-08-20 LAB — POCT URINE PREGNANCY: Preg Test, Ur: POSITIVE

## 2012-08-20 NOTE — Progress Notes (Signed)
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  She is here for discussion of preganancy.  She has two children ages 9 and 32.  She has some nausea but no vomiting  LMP 06/23/12 making [facility] 03/30/13  Not taking any meds for over a year.   Results for orders placed in visit on 08/20/12  POCT URINE PREGNANCY      Result Value Range   Preg Test, Ur Positive     Patient to apply for medicaid and then contact ob/gyn Also, prenatals ordered.

## 2012-09-21 ENCOUNTER — Encounter (HOSPITAL_COMMUNITY): Payer: Self-pay

## 2012-09-21 ENCOUNTER — Inpatient Hospital Stay (HOSPITAL_COMMUNITY)
Admission: AD | Admit: 2012-09-21 | Discharge: 2012-09-22 | Disposition: A | Discharge status: Home / Self Care | Payer: Medicaid Other | Source: Ambulatory Visit | Attending: Obstetrics and Gynecology | Admitting: Obstetrics and Gynecology

## 2012-09-21 DIAGNOSIS — O26899 Other specified pregnancy related conditions, unspecified trimester: Secondary | ICD-10-CM

## 2012-09-21 DIAGNOSIS — O0930 Supervision of pregnancy with insufficient antenatal care, unspecified trimester: Secondary | ICD-10-CM | POA: Insufficient documentation

## 2012-09-21 DIAGNOSIS — R1033 Periumbilical pain: Secondary | ICD-10-CM | POA: Insufficient documentation

## 2012-09-21 DIAGNOSIS — T148XXA Other injury of unspecified body region, initial encounter: Secondary | ICD-10-CM

## 2012-09-21 DIAGNOSIS — R51 Headache: Secondary | ICD-10-CM | POA: Insufficient documentation

## 2012-09-21 DIAGNOSIS — O99891 Other specified diseases and conditions complicating pregnancy: Secondary | ICD-10-CM | POA: Insufficient documentation

## 2012-09-21 LAB — URINALYSIS, ROUTINE W REFLEX MICROSCOPIC
Bilirubin Urine: NEGATIVE
Glucose, UA: NEGATIVE mg/dL
Hgb urine dipstick: NEGATIVE
Specific Gravity, Urine: >1.03 — ABNORMAL HIGH (ref 1.005–1.030)
pH: 6 (ref 5.0–8.0)

## 2012-09-21 LAB — CBC
HCT: 33.3 % — ABNORMAL LOW (ref 36.0–46.0)
Hemoglobin: 11.2 g/dL — ABNORMAL LOW (ref 12.0–15.0)
MCH: 27.7 pg (ref 26.0–34.0)
MCV: 82.2 fL (ref 78.0–100.0)
Platelets: 218 10*3/uL (ref 150–400)
RBC: 4.05 MIL/uL (ref 3.87–5.11)
WBC: 14.2 10*3/uL — ABNORMAL HIGH (ref 4.0–10.5)

## 2012-09-21 MED ORDER — BUTALBITAL-APAP-CAFFEINE 50-325-40 MG PO TABS
2.0000 | ORAL_TABLET | Freq: Once | ORAL | Status: AC
  Administered 2012-09-22: 2 via ORAL
  Filled 2012-09-21: qty 2

## 2012-09-21 NOTE — MAU Provider Note (Signed)
History     CSN: [id number]  Arrival date and time: 09/21/12 2129   First Provider Initiated Contact with Patient 09/21/12 2209      Chief Complaint  Patient presents with  . Abdominal Pain  . Headache   HPI Megan Hunt is a 27 y.o. female G3P2002 who presents today with a headache and umbilical pain.  She has seen Dr Gaynell Face in the past, but hasn't had any care so far in this pregnancy as her insurance is pending.    She states the headache has been present for a week that she rates at a 10/10 that has been constant throbbing frontal pain.  She admits to occasional "black dots" in her vision and some episodes of "blurry vision" lasting up to 10 minutes.  She has taken nothing for her pain.  She is unsure if she has had PIH or gestational diabetes in previous pregnancies.    She states that she has also developed umbilical pain that started today.  She states that she is in no pain at rest, she has 8/10 sharp pain when palpated.  Again, she has tried nothing for the pain.     She states she still has regular nausea, worse in the morning.  She complains of cramping often, admits to occasional clear discharge; she denies contractions, denies vaginal bleeding, and is unsure whether she has felt baby move.  She states she has had difficulty tolerating foods by mouth.  She denies dysuria or dyschezia.    OB History   Grav Para Term Preterm Abortions TAB SAB Ect Mult Living   3 2 2       2       Past Medical History  Diagnosis Date  . Allergic rhinitis   . Asthma   . Atypical chest pain   . Hypertension   . Hypercholesteremia   . Constipation   . Back pain   . Somatic dysfunction   . History of migraines   . Anxiety and depression     History reviewed. No pertinent past surgical history.  Family History  Problem Relation Age of Onset  . Hypertension Father     History  Substance Use Topics  . Smoking status: Never Smoker   . Smokeless tobacco: Not on file  .  Alcohol Use: No    Allergies: No Known Allergies  Prescriptions prior to admission  Medication Sig Dispense Refill  . albuterol (PROVENTIL HFA) 108 (90 BASE) MCG/ACT inhaler Inhale 2 puffs into the lungs every 6 (six) hours as needed for wheezing or shortness of breath.  1 Inhaler  11    Review of Systems  Constitutional: Negative for fever and chills.  HENT: Negative for ear pain, congestion, tinnitus and ear discharge.   Eyes: Positive for blurred vision. Negative for double vision.  Respiratory: Negative for cough, sputum production, shortness of breath and wheezing.   Cardiovascular: Negative for chest pain and palpitations.  Gastrointestinal: Positive for nausea and abdominal pain. Negative for vomiting, diarrhea and constipation.  Genitourinary: Negative for dysuria, urgency and frequency.  Neurological: Positive for headaches.   Physical Exam   Blood pressure 145/83, pulse 91, temperature 98.5 F (36.9 C), temperature source Oral, resp. rate 16, height 5\' 4"  (1.626 m), weight 83.28 kg (183 lb 9.6 oz), last menstrual period 06/23/2012, SpO2 100.00%.  Physical Exam  Constitutional: She is oriented to person, place, and time. She appears well-developed and well-nourished. No distress.  HENT:  Head: Normocephalic.    Nose:  Right sinus exhibits no maxillary sinus tenderness and no frontal sinus tenderness. Left sinus exhibits no maxillary sinus tenderness and no frontal sinus tenderness.  Mouth/Throat: Normal dentition.  Area of pain red on graphic.  No tenderness illicited on palpation or percussion.    Eyes: Pupils are equal, round, and reactive to light.  Cardiovascular: Normal rate and regular rhythm.  Exam reveals no gallop and no friction rub.   No murmur heard. Respiratory: Breath sounds normal. She has no wheezes. She has no rales.  GI: Soft. Bowel sounds are normal. There is tenderness.  Patient is gravid.  There is pain to light palpation in the bilateral lower  quadrants as well as in the umbilical and hypogastric areas.    Neurological: She is alert and oriented to person, place, and time.    MAU Course  Procedures UA performed in office was normal. MDM   Assessment and Plan    Anna Genre 09/21/2012, 10:09 PM   I was present for the exam and agree with above with the addition of HA improved w/ Fioricet, but at end of visit pt stated that the HA was very positional and felt like sinus pressure. Also, no umbilical or ventral hernia palpated on abd exam.    Assessment: 1. Pain of round ligament complicating pregnancy, antepartum   2. Muscle strain   3. Sinus headache    Plan: D/C home in stable condition. Follow-up Information   Follow up with MARSHALL,BERNARD A, MD. (for prenatal care)    Contact information:   [address] [place] [address] [phone]        Medication List    TAKE these medications       albuterol 108 (90 BASE) MCG/ACT inhaler  Commonly known as:  PROVENTIL HFA  Inhale 2 puffs into the lungs every 6 (six) hours as needed for wheezing or shortness of breath.     ibuprofen 600 MG tablet  Commonly known as:  ADVIL,MOTRIN  Take 1 tablet (600 mg total) by mouth every 6 (six) hours as needed for pain. Do not take after [redacted] weeks gestation.         [place], [place] 09/22/2012 5:38 AM

## 2012-09-21 NOTE — MAU Note (Signed)
Sharp constant pain at umbilicus since today, worse when touched. Headache, sees black spots x1 week. RLQ abdominal cramping "for a long time". Denies vaginal bleeding.

## 2012-09-21 NOTE — MAU Note (Signed)
Pt states she has been having a headache for a week, ptstates she also has pain in navel area that started today, pt states she has had pain on her right side for awhile

## 2012-09-22 ENCOUNTER — Encounter (HOSPITAL_COMMUNITY): Payer: Self-pay | Admitting: Advanced Practice Midwife

## 2012-09-22 ENCOUNTER — Inpatient Hospital Stay (HOSPITAL_COMMUNITY): Payer: Medicaid Other

## 2012-09-22 DIAGNOSIS — N949 Unspecified condition associated with female genital organs and menstrual cycle: Secondary | ICD-10-CM

## 2012-09-22 DIAGNOSIS — O9989 Other specified diseases and conditions complicating pregnancy, childbirth and the puerperium: Secondary | ICD-10-CM

## 2012-09-22 LAB — WET PREP, GENITAL
Trich, Wet Prep: NONE SEEN
Yeast Wet Prep HPF POC: NONE SEEN

## 2012-09-22 MED ORDER — IBUPROFEN 600 MG PO TABS: 600.0000 mg | ORAL_TABLET | Freq: Four times a day (QID) | ORAL | Status: DC | PRN

## 2012-09-22 NOTE — MAU Provider Note (Signed)
Attestation of Attending Supervision of Advanced Practitioner (CNM/NP): Evaluation and management procedures were performed by the Advanced Practitioner under my supervision and collaboration.  I have reviewed the Advanced Practitioner's note and chart, and I agree with the management and plan.  Birda Didonato 09/22/2012 7:05 AM

## 2012-10-30 ENCOUNTER — Inpatient Hospital Stay (HOSPITAL_COMMUNITY): Payer: Medicaid Other

## 2012-10-30 ENCOUNTER — Encounter (HOSPITAL_COMMUNITY): Payer: Self-pay | Admitting: *Deleted

## 2012-10-30 ENCOUNTER — Inpatient Hospital Stay (HOSPITAL_COMMUNITY)
Admission: AD | Admit: 2012-10-30 | Discharge: 2012-10-30 | Disposition: A | Discharge status: Home / Self Care | Payer: Medicaid Other | Source: Ambulatory Visit | Attending: Obstetrics | Admitting: Obstetrics

## 2012-10-30 DIAGNOSIS — N949 Unspecified condition associated with female genital organs and menstrual cycle: Secondary | ICD-10-CM | POA: Insufficient documentation

## 2012-10-30 DIAGNOSIS — R109 Unspecified abdominal pain: Secondary | ICD-10-CM | POA: Insufficient documentation

## 2012-10-30 DIAGNOSIS — O99891 Other specified diseases and conditions complicating pregnancy: Secondary | ICD-10-CM | POA: Insufficient documentation

## 2012-10-30 DIAGNOSIS — O26899 Other specified pregnancy related conditions, unspecified trimester: Secondary | ICD-10-CM

## 2012-10-30 DIAGNOSIS — O4410 Placenta previa with hemorrhage, unspecified trimester: Secondary | ICD-10-CM | POA: Insufficient documentation

## 2012-10-30 DIAGNOSIS — O4402 Placenta previa specified as without hemorrhage, second trimester: Secondary | ICD-10-CM

## 2012-10-30 DIAGNOSIS — O9989 Other specified diseases and conditions complicating pregnancy, childbirth and the puerperium: Secondary | ICD-10-CM

## 2012-10-30 DIAGNOSIS — R102 Pelvic and perineal pain: Secondary | ICD-10-CM

## 2012-10-30 HISTORY — DX: Anxiety disorder, unspecified: F41.9

## 2012-10-30 LAB — URINALYSIS, ROUTINE W REFLEX MICROSCOPIC
Glucose, UA: NEGATIVE mg/dL
Ketones, ur: NEGATIVE mg/dL
Leukocytes, UA: NEGATIVE
Protein, ur: NEGATIVE mg/dL
Urobilinogen, UA: 0.2 mg/dL (ref 0.0–1.0)

## 2012-10-30 MED ORDER — IBUPROFEN 600 MG PO TABS
600.0000 mg | ORAL_TABLET | Freq: Once | ORAL | Status: AC
  Administered 2012-10-30: 600 mg via ORAL
  Filled 2012-10-30: qty 1

## 2012-10-30 NOTE — MAU Provider Note (Signed)
History     CSN: [id number]  Arrival date and time: 10/30/12 1649   First Provider Initiated Contact with Patient 10/30/12 1759      Chief Complaint  Patient presents with  . Abdominal Cramping  . Vaginal Discharge   HPI This is a 27 y.o. female at [redacted]w[redacted]d who presents with c/o cramping in lower abdomen for 2 weeks. Has tried nothing for this.  Has some pressure down low also.  No leaking or bleeding. Cultures done in May were negative. 12 wk [place] was normal. Plans to see  Dr Gaynell Face for pregnancy on July 7.  RN Note: Patient states she has been having abdominal cramping for 2-3 weeks that is worse at night and she is unable to sleep. Has some lower abdominal and vaginal pressure. Has a thick clear discharge with no odor, but did have an odor last week. Denies bleeding.       OB History   Grav Para Term Preterm Abortions TAB SAB Ect Mult Living   3 2 2       2       Past Medical History  Diagnosis Date  . Allergic rhinitis   . Asthma   . Atypical chest pain   . Hypertension   . Hypercholesteremia   . Constipation   . Back pain   . Somatic dysfunction   . History of migraines   . Anxiety and depression   . Anxiety     Past Surgical History  Procedure Laterality Date  . Wisdom tooth extraction      Family History  Problem Relation Age of Onset  . Hypertension Father     History  Substance Use Topics  . Smoking status: Never Smoker   . Smokeless tobacco: Not on file  . Alcohol Use: No    Allergies: No Known Allergies  Prescriptions prior to admission  Medication Sig Dispense Refill  . ibuprofen (ADVIL,MOTRIN) 600 MG tablet Take 1 tablet (600 mg total) by mouth every 6 (six) hours as needed for pain. Do not take after [redacted] weeks gestation.  30 tablet  0  . Prenatal Vit-Fe Fumarate-FA (PRENATAL MULTIVITAMIN) TABS Take 1 tablet by mouth daily at 12 noon.      Marland Kitchen albuterol (PROVENTIL HFA) 108 (90 BASE) MCG/ACT inhaler Inhale 2 puffs into the lungs every 6 (six)  hours as needed for wheezing or shortness of breath.  1 Inhaler  11    Review of Systems  Constitutional: Negative for fever, chills and malaise/fatigue.  Gastrointestinal: Positive for abdominal pain (lower abdominal cramping). Negative for nausea, vomiting, diarrhea and constipation.  Genitourinary: Negative for dysuria.  Musculoskeletal: Negative for myalgias.  Neurological: Negative for dizziness, weakness and headaches.   Physical Exam   Blood pressure 132/70, pulse 86, temperature 98.7 F (37.1 C), temperature source Oral, resp. rate 16, height 5\' 4"  (1.626 m), weight 85.911 kg (189 lb 6.4 oz), last menstrual period 06/23/2012, SpO2 100.00%.  Physical Exam  Constitutional: She is oriented to person, place, and time. She appears well-developed and well-nourished. No distress.  HENT:  Head: Normocephalic.  Cardiovascular: Normal rate.   Respiratory: Effort normal.  GI: Soft. She exhibits no distension. There is tenderness (slight over lower pelvis).  Genitourinary: Vagina normal and uterus normal. No vaginal discharge found.  Cervix long and closed, firm   Musculoskeletal: Normal range of motion.  Neurological: She is alert and oriented to person, place, and time.  Skin: Skin is warm and dry.  Psychiatric: She has a  normal mood and affect.    MAU Course  Procedures  MDM Tried one dose of ibuprofen without much relief. Will check limited US to look at placenta, fluid and cervical length. Could be round ligament, but will rule out other problems  US shows size=dates, normal fluid, and placenta previa.  Assessment and Plan  A:  SIUP at [redacted]w[redacted]d        Abdominal cramping      Probable round ligament pain       Placenta Previa  P:  Discussed cramping might be RLP or GI cramping       Previa precautions reviewed       Call Dr Gaynell Face if not better    [facility] 10/30/2012, 6:10 PM

## 2012-10-30 NOTE — MAU Note (Signed)
Patient states she has been having abdominal cramping for 2-3 weeks that is worse at night and she is unable to sleep. Has some lower abdominal and vaginal pressure. Has a thick clear discharge with no odor, but did have an odor last week. Denies bleeding.

## 2012-10-30 NOTE — MAU Note (Signed)
Patient has not started prenatal care yet with Dr. Gaynell Face.

## 2012-10-31 ENCOUNTER — Encounter: Payer: Self-pay | Admitting: Advanced Practice Midwife

## 2012-10-31 DIAGNOSIS — O4402 Placenta previa specified as without hemorrhage, second trimester: Secondary | ICD-10-CM | POA: Insufficient documentation

## 2012-11-29 ENCOUNTER — Encounter (HOSPITAL_COMMUNITY): Payer: Self-pay

## 2012-11-29 ENCOUNTER — Inpatient Hospital Stay (HOSPITAL_COMMUNITY)
Admission: AD | Admit: 2012-11-29 | Discharge: 2012-11-30 | Disposition: A | Discharge status: Home / Self Care | Payer: Medicaid Other | Source: Ambulatory Visit | Attending: Obstetrics | Admitting: Obstetrics

## 2012-11-29 DIAGNOSIS — M545 Low back pain, unspecified: Secondary | ICD-10-CM | POA: Insufficient documentation

## 2012-11-29 DIAGNOSIS — M25551 Pain in right hip: Secondary | ICD-10-CM

## 2012-11-29 DIAGNOSIS — O99891 Other specified diseases and conditions complicating pregnancy: Secondary | ICD-10-CM | POA: Insufficient documentation

## 2012-11-29 NOTE — MAU Note (Signed)
Pt G3 P2 at 22.5wks having right hip and lower back pain since last night.  Right leg "giving out" tonight when walking.

## 2012-11-30 DIAGNOSIS — M25559 Pain in unspecified hip: Secondary | ICD-10-CM

## 2012-11-30 MED ORDER — ACETAMINOPHEN-CODEINE #3 300-30 MG PO TABS
2.0000 | ORAL_TABLET | Freq: Once | ORAL | Status: DC
  Filled 2012-11-30: qty 2

## 2012-11-30 NOTE — MAU Provider Note (Signed)
History     CSN: [id number]  Arrival date and time: 11/29/12 2253   First Provider Initiated Contact with Patient 11/30/12 0032      Chief Complaint  Patient presents with  . Back Pain   HPI 27 y.o. G3P2002 at [redacted]w[redacted]d presents to the MAU for back pain. The pain started yesterday, located on right hip, describes as sharp and pain up to 7/10. She also has had her "leg give out" multiple times throughout the day. She cannot get comfortable laying down or sitting, and has to limp when walking because bending the leg causes pain. She denies any trauma to the area before this started, but does say her daughter fell on her yesterday on that spot. She denies any shooting pains down the leg or numbness to sensation. She feels baby moving, denies contractions, no bleeding or gush of fluid, no vaginal discharge. No chest pain, shortness of breath, dizziness, headache.  OB History   Grav Para Term Preterm Abortions TAB SAB Ect Mult Living   3 2 2       2       Past Medical History  Diagnosis Date  . Allergic rhinitis   . Asthma   . Atypical chest pain   . Hypercholesteremia   . Constipation   . Back pain   . Somatic dysfunction   . History of migraines   . Anxiety and depression   . Anxiety   . Hypertension     no longer taking meds    Past Surgical History  Procedure Laterality Date  . Wisdom tooth extraction      Family History  Problem Relation Age of Onset  . Hypertension Father     History  Substance Use Topics  . Smoking status: Never Smoker   . Smokeless tobacco: Not on file  . Alcohol Use: No    Allergies: No Known Allergies  Prescriptions prior to admission  Medication Sig Dispense Refill  . ibuprofen (ADVIL,MOTRIN) 600 MG tablet Take 600 mg by mouth every 6 (six) hours as needed for pain.      . Prenatal Vit-Fe Fumarate-FA (PRENATAL MULTIVITAMIN) TABS Take 1 tablet by mouth daily at 12 noon.        ROS negative except as above Physical Exam   Blood  pressure 137/75, pulse 82, temperature 98.1 F (36.7 C), temperature source Oral, height 5\' 4"  (1.626 m), weight 90.175 kg (198 lb 12.8 oz), last menstrual period 06/23/2012.  Physical Exam General appearance: alert, cooperative and no distress Head: Normocephalic, without obvious abnormality, atraumatic Lungs: clear to auscultation bilaterally Heart: regular rate and rhythm, S1, S2 normal, no murmur, click, rub or gallop Abdomen: soft, nontender, gravid Pulses: 2+ and symmetric DP and PT Skin: warm and dry Extremities: no edema, redness or tenderness in the calves or thighs - Right hip moderately tender to deep palpation, posterolateral - Strength 3/5 bilaterally hip flexion - 2+ patellar and achilles reflexes bilaterally - Sensation to light touch grossly intact distally - Straight leg test positive on left leg illiciting pain on right hip, also positive on right. Does not cause radicular pain - Gait: favors left leg and won't bend right  Good fetal heart doppler   MAU Course  Procedures  MDM  Assessment and Plan  27 y.o. [id number] [redacted]w[redacted]d  Sciatic lower back pain Conservative management at this point in time Tylenol for pain, patient wants to get OTC on her own Back and hip exercises given and encouraged Reassured that this can  be common in pregnancy, but does not make any cause for concern about pregnancy  Stable for discharge  Tawni Carnes 11/30/2012, 12:58 AM   I was present for the exam and agree with above. FHR 149 by doppler. Pt will discuss PT referral PRN w/ PCP.  [place], CNM 11/30/2012 5:42 AM

## 2013-01-11 ENCOUNTER — Other Ambulatory Visit (HOSPITAL_COMMUNITY): Payer: Self-pay | Admitting: Obstetrics

## 2013-01-11 DIAGNOSIS — O4400 Placenta previa specified as without hemorrhage, unspecified trimester: Secondary | ICD-10-CM

## 2013-01-15 ENCOUNTER — Ambulatory Visit (HOSPITAL_COMMUNITY)
Admission: RE | Admit: 2013-01-15 | Discharge: 2013-01-15 | Disposition: A | Discharge status: Home / Self Care | Payer: Medicaid Other | Source: Ambulatory Visit | Attending: Obstetrics | Admitting: Obstetrics

## 2013-01-15 ENCOUNTER — Other Ambulatory Visit (HOSPITAL_COMMUNITY): Payer: Self-pay | Admitting: Obstetrics

## 2013-01-15 VITALS — BP 128/75 | HR 93 | Wt 203.5 lb

## 2013-01-15 DIAGNOSIS — Z363 Encounter for antenatal screening for malformations: Secondary | ICD-10-CM | POA: Insufficient documentation

## 2013-01-15 DIAGNOSIS — O4400 Placenta previa specified as without hemorrhage, unspecified trimester: Secondary | ICD-10-CM | POA: Insufficient documentation

## 2013-01-15 DIAGNOSIS — Z3689 Encounter for other specified antenatal screening: Secondary | ICD-10-CM

## 2013-01-15 DIAGNOSIS — Z1389 Encounter for screening for other disorder: Secondary | ICD-10-CM | POA: Insufficient documentation

## 2013-01-15 DIAGNOSIS — O358XX0 Maternal care for other (suspected) fetal abnormality and damage, not applicable or unspecified: Secondary | ICD-10-CM | POA: Insufficient documentation

## 2013-01-15 NOTE — Progress Notes (Signed)
[facility] ultrasound  Indication: 27 yr old G3P2002 at [redacted]w[redacted]d with previous finding of placenta previa on outside ultrasound for fetal ultrasound.  Findings: 1. Single intrauterine pregnancy. 2. Estimated fetal weight is in the 37th%. 3. Posterior placenta without evidence of previa. 4. Normal amniotic fluid index. 5. Normal transvaginal cervical length. 6. The views of the heart are limited. 7. There is an echogenic focus in the left ventricle. 8. The remainder of the limited anatomy survey is normal.  Recommendations: 1. Appropriate fetal growth. 2. Limited anatomy survey: - recommend follow up in 2-3 weeks to complete anatomic survey 3. Echogenic focus in the left ventricle: - patient met with genetic counselor; see separate report - after counseling patient declined aneuploidy testing - patient did not have quad screen 4. No evidence of placenta previa.  Eulis Foster, MD

## 2013-01-16 ENCOUNTER — Ambulatory Visit (HOSPITAL_COMMUNITY): Payer: Medicaid Other

## 2013-01-16 NOTE — Progress Notes (Signed)
Genetic Counseling  High-Risk Gestation Note  Appointment Date:  01/15/2013 Referred By: Kathreen Cosier, MD Date of Birth:  April 16, 1986    Pregnancy History: [id number] Estimated Date of Delivery: 03/30/13 Estimated Gestational Age: [redacted]w[redacted]d Attending: Eulis Foster, MD   I met with [name] and her partner for genetic counseling because of the ultrasound finding of echogenic intracardiac focus.  We began by reviewing the ultrasound in detail. Ultrasound performed today visualized the pregnancy to be [redacted]w[redacted]d gestation. Echogenic intracardiac focus (EIF) was visualized in the left fetal ventricle. Remaining visualized fetal anatomy appeared normal. Complete ultrasound results reported separately.   An isolated echogenic focus is generally believed to be a normal variation without any concerns for the pregnancy.  Isolated echogenic cardiac foci are not associated with congenital heart defects in the baby or compromised cardiac function after birth.  However, an echogenic cardiac focus is associated with a slightly increased chance for Down syndrome in the pregnancy, particularly when seen in the presence of additional risk factors for fetal aneuploidy. We reviewed genes, chromosomes, and the association with fetal aneuploidy and the again ova. We reviewed trisomy 66 (Down syndrome) including the variable features and prognosis. We discussed that given the patient's age alone, the pregnancy would not be considered to be at increased risk for Down syndrome.  We discussed that if the presence of an EIF was used to adjust the risk for Down syndrome in the pregnancy, the adjusted risk would still be expected to be approximately less than 1 in 400.   We reviewed available screening and diagnostic options.  Regarding screening tests, we discussed the option of noninvasive prenatal screening (NIPS)/cell free fetal DNA testing.  We reviewed the risks, benefits, and limitations including the conditions  for which it screens, detection rates, and false positive rates of each.  They understand that screens are not diagnostic and do not assess for all genetic conditions or birth defects.  We also reviewed the availability of diagnostic option of amniocentesis.  We discussed the risks, limitations, and benefits of each. We reviewed the associated 1 in 300-500 risk for complications with amniocentesis, including the risk for spontaneous labor and delivery.  However, we reviewed that the risk of complications from amniocentesis is likely higher than the risk for fetal Down syndrome in the pregnancy given the available information. After reviewing these options, [name] declined NIPS and amniocentesis at this time. The couple planned to further consider NIPS and will contact our office should they elect to pursue this option.   She understands that ultrasound cannot rule out all birth defects or genetic syndromes.   A detailed family history was not obtained due to time constraints. Both family histories were briefly reviewed and found to be contributory for a history of five or six miscarriages for the patient's parents. The patient reported that an underlying cause was not known for the pregnancy losses. The couple reported no known family history of birth defects, intellectual disability, or known genetic conditions. Approximately 1 in 6 confirmed pregnancies results in miscarriage. A single underlying cause is more likely to be suspected when a couple has experienced 3 or more losses. Several possible causes including chromosome rearrangements, antibodies, and thrombophilia. We discussed that an underlying genetic cause is identified in a minority of cases. Thus, a family history of recurrent pregnancy loss typically would not have implications for relatives. However, additional information is needed regarding the etiology to accurately assess potential implications for relatives. Without further  information regarding the provided family history, an accurate genetic risk cannot be calculated. Further genetic counseling is warranted if more information is obtained.   [name] denied exposure to environmental toxins or chemical agents. She denied the use of alcohol, tobacco or street drugs. She denied significant viral illnesses during the course of her pregnancy. Her medical and surgical histories were noncontributory.   I counseled this couple regarding the above risks and available options.  The approximate face-to-face time with the genetic counselor was 16 minutes.  Quinn Plowman, MS Certified Genetic Counselor 01/16/2013

## 2013-01-28 ENCOUNTER — Encounter (HOSPITAL_COMMUNITY): Payer: Self-pay | Admitting: *Deleted

## 2013-01-28 ENCOUNTER — Inpatient Hospital Stay (HOSPITAL_COMMUNITY)
Admission: AD | Admit: 2013-01-28 | Discharge: 2013-01-28 | Disposition: A | Discharge status: Home / Self Care | Payer: Medicaid Other | Source: Ambulatory Visit | Attending: Obstetrics | Admitting: Obstetrics

## 2013-01-28 DIAGNOSIS — O2650 Maternal hypotension syndrome, unspecified trimester: Secondary | ICD-10-CM | POA: Insufficient documentation

## 2013-01-28 DIAGNOSIS — O4402 Placenta previa specified as without hemorrhage, second trimester: Secondary | ICD-10-CM

## 2013-01-28 DIAGNOSIS — O4410 Placenta previa with hemorrhage, unspecified trimester: Secondary | ICD-10-CM | POA: Insufficient documentation

## 2013-01-28 DIAGNOSIS — E860 Dehydration: Secondary | ICD-10-CM | POA: Insufficient documentation

## 2013-01-28 DIAGNOSIS — O4400 Placenta previa specified as without hemorrhage, unspecified trimester: Secondary | ICD-10-CM

## 2013-01-28 LAB — URINALYSIS, ROUTINE W REFLEX MICROSCOPIC
Bilirubin Urine: NEGATIVE
Ketones, ur: NEGATIVE mg/dL
Nitrite: NEGATIVE
Protein, ur: 100 mg/dL — AB
pH: 6 (ref 5.0–8.0)

## 2013-01-28 LAB — GLUCOSE, CAPILLARY: Glucose-Capillary: 103 mg/dL — ABNORMAL HIGH (ref 70–99)

## 2013-01-28 LAB — URINE MICROSCOPIC-ADD ON

## 2013-01-28 NOTE — MAU Provider Note (Signed)
History     CSN: [id number]  Arrival date and time: 01/28/13 1916   First Provider Initiated Contact with Patient 01/28/13 1938      Chief Complaint  Patient presents with  . Near Syncope   HPI  [name] is a 27 y.o. female G3P2002 at [redacted]w[redacted]d [place] and began to feel faint and dizziness; she does not recall passing out. Her mom was holding her arm and helped her to a sitting position. This happened at 1800; prior to that she last ate at 1700 and that was a funnel cake. Prior to that she ate bojangles around 11:00. She has not had anything to drink today. Lately she feels like she has a decreased appetite; she is feeling nauseous now, has not vomited. Her dizziness is gone. She reports good fetal movement, denies LOF, vaginal bleeding, vaginal itching/burning, urinary symptoms, dizziness currently, or fever/chills.  The Mom is currently at the bedside and says that she was holding the patient the whole time while she felt faint; the patient was diaphoretic.   OB History   Grav Para Term Preterm Abortions TAB SAB Ect Mult Living   3 2 2       2       Past Medical History  Diagnosis Date  . Allergic rhinitis   . Asthma   . Atypical chest pain   . Hypercholesteremia   . Constipation   . Back pain   . Somatic dysfunction   . History of migraines   . Anxiety and depression   . Anxiety   . Hypertension     no longer taking meds    Past Surgical History  Procedure Laterality Date  . Wisdom tooth extraction      Family History  Problem Relation Age of Onset  . Hypertension Father     History  Substance Use Topics  . Smoking status: Never Smoker   . Smokeless tobacco: Not on file  . Alcohol Use: No    Allergies: No Known Allergies  Prescriptions prior to admission  Medication Sig Dispense Refill  . Prenatal Vit-Fe Fumarate-FA (PRENATAL MULTIVITAMIN) TABS Take 1 tablet by mouth daily at 12 noon.        Review of Systems   Constitutional: Positive for malaise/fatigue. Negative for fever and chills.  Eyes: Positive for blurred vision and double vision.  Gastrointestinal: Positive for nausea and abdominal pain. Negative for vomiting.       +cramping   Genitourinary: Negative for dysuria, urgency and frequency.  Neurological: Positive for dizziness and headaches. Negative for weakness.   Physical Exam   Blood pressure 137/78, pulse 92, temperature 98.3 F (36.8 C), temperature source Oral, resp. rate 20, height 5\' 4"  (1.626 m), weight 92.08 kg (203 lb), last menstrual period 06/23/2012, SpO2 98.00%.  Fetal Tracing: Baseline: 140 bpm Variability: minimal-moderate  Accelerations: 10x10 Decelerations: none  Toco:None   Physical Exam  Constitutional: She appears well-developed and well-nourished.   MAU Course  Procedures None   Results for orders placed during the hospital encounter of 01/28/13 (from the past 24 hour(s))  URINALYSIS, ROUTINE W REFLEX MICROSCOPIC     Status: Abnormal   Collection Time    01/28/13  7:50 PM      Result Value Range   Color, Urine YELLOW  YELLOW   APPearance CLEAR  CLEAR   Specific Gravity, Urine >1.030 (*) 1.005 - 1.030   pH 6.0  5.0 - 8.0   Glucose,  UA NEGATIVE  NEGATIVE mg/dL   Hgb urine dipstick NEGATIVE  NEGATIVE   Bilirubin Urine NEGATIVE  NEGATIVE   Ketones, ur NEGATIVE  NEGATIVE mg/dL   Protein, ur 161 (*) NEGATIVE mg/dL   Urobilinogen, UA 0.2  0.0 - 1.0 mg/dL   Nitrite NEGATIVE  NEGATIVE   Leukocytes, UA NEGATIVE  NEGATIVE  URINE MICROSCOPIC-ADD ON     Status: Abnormal   Collection Time    01/28/13  7:50 PM      Result Value Range   Squamous Epithelial / LPF FEW (*) RARE   WBC, UA 0-2  <3 WBC/hpf   RBC / HPF 3-6  <3 RBC/hpf   Bacteria, UA FEW (*) RARE  GLUCOSE, CAPILLARY     Status: Abnormal   Collection Time    01/28/13  7:53 PM      Result Value Range   Glucose-Capillary 103 (*) 70 - 99 mg/dL    MDM CBG- 096 in MAU  Serial blood  pressures UA  2142: Patient is feeling better after PO hydration.    Assessment and Plan   Report given to Thressa Sheller CNM who resumes care of this patient   1. Placenta previa antepartum in second trimester   2. Dehydration, mild   with likely an episode of hypoglcemia  Third trimester danger signs reviewe Return to MAU as needed FU with Dr. Gaynell Face as planned.   RASCH, JENNIFER IRENE FNP-C 01/28/2013, 8:05 PM

## 2013-01-28 NOTE — MAU Note (Signed)
[place] and reports things started to get blurry and she just could not make it. Did not loose conscienceness but felt very dizzy and light headed.

## 2013-02-05 ENCOUNTER — Ambulatory Visit (HOSPITAL_COMMUNITY)
Admission: RE | Admit: 2013-02-05 | Discharge: 2013-02-05 | Disposition: A | Discharge status: Home / Self Care | Payer: Medicaid Other | Source: Ambulatory Visit | Attending: Obstetrics | Admitting: Obstetrics

## 2013-02-05 DIAGNOSIS — Z3689 Encounter for other specified antenatal screening: Secondary | ICD-10-CM | POA: Insufficient documentation

## 2013-02-05 DIAGNOSIS — O358XX0 Maternal care for other (suspected) fetal abnormality and damage, not applicable or unspecified: Secondary | ICD-10-CM | POA: Insufficient documentation

## 2013-02-05 NOTE — Progress Notes (Signed)
Megan Hunt  was seen today for an ultrasound appointment.  See full report in [facility].  Impression: Single IUP at 32 3/7 weeks Normal interval growth (43rd %tile) Normal interval anatomy - unable to visualize aortic / ductal arch due to fetal presentation Posterior placenta without previa Normal amniotic fluid volume  Recommendations: Follow-up ultrasounds as clinically indicated.   Alpha Gula, MD

## 2013-03-19 ENCOUNTER — Inpatient Hospital Stay (HOSPITAL_COMMUNITY)
Admission: AD | Admit: 2013-03-19 | Discharge: 2013-03-19 | Disposition: A | Discharge status: Home / Self Care | Payer: Medicaid Other | Source: Ambulatory Visit | Attending: Obstetrics | Admitting: Obstetrics

## 2013-03-19 ENCOUNTER — Encounter (HOSPITAL_COMMUNITY): Payer: Self-pay | Admitting: *Deleted

## 2013-03-19 DIAGNOSIS — R109 Unspecified abdominal pain: Secondary | ICD-10-CM | POA: Insufficient documentation

## 2013-03-19 DIAGNOSIS — O99891 Other specified diseases and conditions complicating pregnancy: Secondary | ICD-10-CM | POA: Insufficient documentation

## 2013-03-19 DIAGNOSIS — O212 Late vomiting of pregnancy: Secondary | ICD-10-CM | POA: Insufficient documentation

## 2013-03-19 NOTE — MAU Note (Signed)
Lower abd cramping all day, nausea, no vomiting, HA.  Denies bleeding or LOF.  Reports good FM.

## 2013-03-22 ENCOUNTER — Encounter (HOSPITAL_COMMUNITY): Payer: Medicaid Other | Admitting: Anesthesiology

## 2013-03-22 ENCOUNTER — Inpatient Hospital Stay (HOSPITAL_COMMUNITY): Payer: Medicaid Other | Admitting: Anesthesiology

## 2013-03-22 ENCOUNTER — Encounter (HOSPITAL_COMMUNITY): Payer: Self-pay

## 2013-03-22 ENCOUNTER — Inpatient Hospital Stay (HOSPITAL_COMMUNITY)
Admission: AD | Admit: 2013-03-22 | Discharge: 2013-03-24 | DRG: 775 | Disposition: A | Discharge status: Home / Self Care | Payer: Medicaid Other | Source: Ambulatory Visit | Attending: Obstetrics | Admitting: Obstetrics

## 2013-03-22 LAB — OB RESULTS CONSOLE RUBELLA ANTIBODY, IGM: Rubella: IMMUNE

## 2013-03-22 LAB — CBC
HCT: 36.5 % (ref 36.0–46.0)
Hemoglobin: 12.5 g/dL (ref 12.0–15.0)
MCH: 27.6 pg (ref 26.0–34.0)
MCHC: 34.2 g/dL (ref 30.0–36.0)
MCV: 80.6 fL (ref 78.0–100.0)

## 2013-03-22 LAB — OB RESULTS CONSOLE GC/CHLAMYDIA
Chlamydia: NEGATIVE
Gonorrhea: NEGATIVE

## 2013-03-22 LAB — OB RESULTS CONSOLE HIV ANTIBODY (ROUTINE TESTING): HIV: NONREACTIVE

## 2013-03-22 LAB — OB RESULTS CONSOLE RPR: RPR: NONREACTIVE

## 2013-03-22 MED ORDER — PRENATAL MULTIVITAMIN CH: 1.0000 | ORAL_TABLET | Freq: Every day | ORAL | Status: DC

## 2013-03-22 MED ORDER — PHENYLEPHRINE 40 MCG/ML (10ML) SYRINGE FOR IV PUSH (FOR BLOOD PRESSURE SUPPORT)
80.0000 ug | PREFILLED_SYRINGE | INTRAVENOUS | Status: DC | PRN
  Filled 2013-03-22: qty 2

## 2013-03-22 MED ORDER — OXYCODONE-ACETAMINOPHEN 5-325 MG PO TABS
1.0000 | ORAL_TABLET | ORAL | Status: DC | PRN
  Administered 2013-03-22 – 2013-03-24 (×5): 1 via ORAL
  Administered 2013-03-24: 2 via ORAL
  Filled 2013-03-22 (×2): qty 1
  Filled 2013-03-22: qty 2
  Filled 2013-03-22: qty 1

## 2013-03-22 MED ORDER — DIPHENHYDRAMINE HCL 50 MG/ML IJ SOLN: 12.5000 mg | INTRAMUSCULAR | Status: DC | PRN

## 2013-03-22 MED ORDER — OXYCODONE-ACETAMINOPHEN 5-325 MG PO TABS
1.0000 | ORAL_TABLET | ORAL | Status: DC | PRN
  Administered 2013-03-23 (×2): 1 via ORAL
  Filled 2013-03-22 (×4): qty 1

## 2013-03-22 MED ORDER — FENTANYL 2.5 MCG/ML BUPIVACAINE 1/10 % EPIDURAL INFUSION (WH - ANES): 14.0000 mL/h | INTRAMUSCULAR | Status: DC | PRN

## 2013-03-22 MED ORDER — EPHEDRINE 5 MG/ML INJ
10.0000 mg | INTRAVENOUS | Status: DC | PRN
  Filled 2013-03-22: qty 2
  Filled 2013-03-22: qty 4

## 2013-03-22 MED ORDER — OXYTOCIN 40 UNITS IN LACTATED RINGERS INFUSION - SIMPLE MED
1.0000 m[IU]/min | INTRAVENOUS | Status: DC
  Administered 2013-03-22: 2 m[IU]/min via INTRAVENOUS

## 2013-03-22 MED ORDER — SENNOSIDES-DOCUSATE SODIUM 8.6-50 MG PO TABS
2.0000 | ORAL_TABLET | ORAL | Status: DC
  Administered 2013-03-22 – 2013-03-23 (×2): 2 via ORAL
  Filled 2013-03-22 (×2): qty 2

## 2013-03-22 MED ORDER — ONDANSETRON HCL 4 MG/2ML IJ SOLN: 4.0000 mg | INTRAMUSCULAR | Status: DC | PRN

## 2013-03-22 MED ORDER — FENTANYL 2.5 MCG/ML BUPIVACAINE 1/10 % EPIDURAL INFUSION (WH - ANES)
14.0000 mL/h | INTRAMUSCULAR | Status: DC | PRN
  Filled 2013-03-22: qty 125

## 2013-03-22 MED ORDER — IBUPROFEN 600 MG PO TABS
600.0000 mg | ORAL_TABLET | Freq: Four times a day (QID) | ORAL | Status: DC
  Administered 2013-03-22 – 2013-03-24 (×8): 600 mg via ORAL
  Filled 2013-03-22 (×8): qty 1

## 2013-03-22 MED ORDER — CITRIC ACID-SODIUM CITRATE 334-500 MG/5ML PO SOLN: 30.0000 mL | ORAL | Status: DC | PRN

## 2013-03-22 MED ORDER — DIBUCAINE 1 % RE OINT
1.0000 "application " | TOPICAL_OINTMENT | RECTAL | Status: DC | PRN
  Filled 2013-03-22: qty 28

## 2013-03-22 MED ORDER — LIDOCAINE HCL (PF) 1 % IJ SOLN
INTRAMUSCULAR | Status: DC | PRN
  Administered 2013-03-22: 4 mL
  Administered 2013-03-22: 2 mL
  Administered 2013-03-22: 4 mL

## 2013-03-22 MED ORDER — DIPHENHYDRAMINE HCL 25 MG PO CAPS: 25.0000 mg | ORAL_CAPSULE | Freq: Four times a day (QID) | ORAL | Status: DC | PRN

## 2013-03-22 MED ORDER — ONDANSETRON HCL 4 MG/2ML IJ SOLN: 4.0000 mg | Freq: Four times a day (QID) | INTRAMUSCULAR | Status: DC | PRN

## 2013-03-22 MED ORDER — INFLUENZA VAC SPLIT QUAD 0.5 ML IM SUSP
0.5000 mL | INTRAMUSCULAR | Status: AC
  Administered 2013-03-23: 0.5 mL via INTRAMUSCULAR
  Filled 2013-03-22: qty 0.5

## 2013-03-22 MED ORDER — ONDANSETRON HCL 4 MG PO TABS: 4.0000 mg | ORAL_TABLET | ORAL | Status: DC | PRN

## 2013-03-22 MED ORDER — TETANUS-DIPHTH-ACELL PERTUSSIS 5-2.5-18.5 LF-MCG/0.5 IM SUSP
0.5000 mL | Freq: Once | INTRAMUSCULAR | Status: AC
  Administered 2013-03-23: 0.5 mL via INTRAMUSCULAR
  Filled 2013-03-22 (×2): qty 0.5

## 2013-03-22 MED ORDER — BUTORPHANOL TARTRATE 1 MG/ML IJ SOLN: 1.0000 mg | INTRAMUSCULAR | Status: DC | PRN

## 2013-03-22 MED ORDER — PRENATAL MULTIVITAMIN CH
1.0000 | ORAL_TABLET | Freq: Every day | ORAL | Status: DC
  Administered 2013-03-23: 1 via ORAL
  Filled 2013-03-22: qty 1

## 2013-03-22 MED ORDER — PHENYLEPHRINE 40 MCG/ML (10ML) SYRINGE FOR IV PUSH (FOR BLOOD PRESSURE SUPPORT)
80.0000 ug | PREFILLED_SYRINGE | INTRAVENOUS | Status: DC | PRN
  Filled 2013-03-22: qty 10
  Filled 2013-03-22: qty 2

## 2013-03-22 MED ORDER — LIDOCAINE HCL (PF) 1 % IJ SOLN
30.0000 mL | INTRAMUSCULAR | Status: DC | PRN
  Filled 2013-03-22 (×2): qty 30

## 2013-03-22 MED ORDER — FERROUS SULFATE 325 (65 FE) MG PO TABS
325.0000 mg | ORAL_TABLET | Freq: Two times a day (BID) | ORAL | Status: DC
  Administered 2013-03-23 – 2013-03-24 (×3): 325 mg via ORAL
  Filled 2013-03-22 (×6): qty 1

## 2013-03-22 MED ORDER — LACTATED RINGERS IV SOLN: 500.0000 mL | Freq: Once | INTRAVENOUS | Status: DC

## 2013-03-22 MED ORDER — WITCH HAZEL-GLYCERIN EX PADS
1.0000 "application " | MEDICATED_PAD | CUTANEOUS | Status: DC | PRN
  Administered 2013-03-22: 1 via TOPICAL

## 2013-03-22 MED ORDER — ACETAMINOPHEN 325 MG PO TABS: 650.0000 mg | ORAL_TABLET | ORAL | Status: DC | PRN

## 2013-03-22 MED ORDER — EPHEDRINE 5 MG/ML INJ
10.0000 mg | INTRAVENOUS | Status: DC | PRN
  Filled 2013-03-22: qty 2

## 2013-03-22 MED ORDER — SIMETHICONE 80 MG PO CHEW: 80.0000 mg | CHEWABLE_TABLET | ORAL | Status: DC | PRN

## 2013-03-22 MED ORDER — LACTATED RINGERS IV SOLN
INTRAVENOUS | Status: DC
  Administered 2013-03-22 (×2): via INTRAVENOUS

## 2013-03-22 MED ORDER — IBUPROFEN 600 MG PO TABS: 600.0000 mg | ORAL_TABLET | Freq: Four times a day (QID) | ORAL | Status: DC | PRN

## 2013-03-22 MED ORDER — TERBUTALINE SULFATE 1 MG/ML IJ SOLN: 0.2500 mg | Freq: Once | INTRAMUSCULAR | Status: DC | PRN

## 2013-03-22 MED ORDER — LANOLIN HYDROUS EX OINT: TOPICAL_OINTMENT | CUTANEOUS | Status: DC | PRN

## 2013-03-22 MED ORDER — ZOLPIDEM TARTRATE 5 MG PO TABS: 5.0000 mg | ORAL_TABLET | Freq: Every evening | ORAL | Status: DC | PRN

## 2013-03-22 MED ORDER — OXYTOCIN BOLUS FROM INFUSION: 500.0000 mL | INTRAVENOUS | Status: DC

## 2013-03-22 MED ORDER — OXYTOCIN 40 UNITS IN LACTATED RINGERS INFUSION - SIMPLE MED
62.5000 mL/h | INTRAVENOUS | Status: DC
  Filled 2013-03-22: qty 1000

## 2013-03-22 MED ORDER — LACTATED RINGERS IV SOLN
500.0000 mL | INTRAVENOUS | Status: DC | PRN
  Administered 2013-03-22: 1000 mL via INTRAVENOUS

## 2013-03-22 MED ORDER — BENZOCAINE-MENTHOL 20-0.5 % EX AERO
1.0000 "application " | INHALATION_SPRAY | CUTANEOUS | Status: DC | PRN
  Administered 2013-03-22: 1 via TOPICAL
  Filled 2013-03-22 (×2): qty 56

## 2013-03-22 MED ORDER — FLEET ENEMA 7-19 GM/118ML RE ENEM: 1.0000 | ENEMA | RECTAL | Status: DC | PRN

## 2013-03-22 MED ORDER — FENTANYL 2.5 MCG/ML BUPIVACAINE 1/10 % EPIDURAL INFUSION (WH - ANES)
INTRAMUSCULAR | Status: DC | PRN
  Administered 2013-03-22: 14 mL/h via EPIDURAL

## 2013-03-22 NOTE — MAU Note (Signed)
Contractions every 3-5 minutes since 230 am. Denies LOF or VB. Was closed in office 2 days ago. Positive fetal movement.

## 2013-03-22 NOTE — Lactation Note (Signed)
This note was copied from the chart of Megan Hunt. Lactation Consultation Note  Initial visit at 6 hours of age.  [facility] LC resources given and discussed.  Baby has had low temperatures, with initial latch of 20 minutes on and off, followed by 2 bottles.  Mom is unsure about breastfeeding and states she may pump.  Encouraged mom that it takes multiple attempts to get baby to latch well sometimes.  Mom is doing much skin to skin due to temp. Regulation.  Encouraged and instructed on hand expression.  Encouraged to feed with cues and to call for assist as needed.  Offered DEBP here, mom declines at this time and will let us know if she needs anything. 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      Patient Name: Megan Hunt [id number] Date: 03/22/2013 Reason for consult: Initial assessment;Difficult latch   Maternal Data Formula Feeding for Exclusion: No Infant to breast within first hour of birth: Yes Has patient been taught Hand Expression?: Yes Does the patient have breastfeeding experience prior to this delivery?: Yes  Feeding Feeding Type: Bottle Fed - Formula  LATCH Score/Interventions                      Lactation Tools Discussed/Used     Consult Status Consult Status: PRN    Jannifer Rodney 03/22/2013, 5:51 PM

## 2013-03-22 NOTE — Anesthesia Procedure Notes (Signed)
Epidural Patient location during procedure: OB Start time: 03/22/2013 6:35 AM End time: 03/22/2013 6:50 AM  Staffing Anesthesiologist: Lewie Loron R Performed by: anesthesiologist   Preanesthetic Checklist Completed: patient identified, pre-op evaluation, timeout performed, IV checked, risks and benefits discussed and monitors and equipment checked  Epidural Patient position: sitting Prep: site prepped and draped and DuraPrep Patient monitoring: blood pressure, continuous pulse ox and heart rate Approach: midline Injection technique: LOR air and LOR saline  Needle:  Needle type: Tuohy  Needle gauge: 17 G Needle length: 9 cm Needle insertion depth: 7 cm Catheter type: closed end flexible Catheter size: 19 Gauge Catheter at skin depth: 13 cm Test dose: positive and negative  Assessment Sensory level: T8 Events: blood not aspirated, injection not painful, no injection resistance, negative IV test and no paresthesia  Additional Notes First attempt, LOR and catheter threaded without issue. No blood aspirated, but test dose through catheter positive. Catheter removed and replaced. Second test dose negative. Pt comfortable.Reason for block:procedure for pain

## 2013-03-22 NOTE — Progress Notes (Signed)
Patient was referred for history of depression/anxiety. * Referral screened out by Clinical Social Worker because none of the following criteria appear to apply: ~ History of anxiety/depression during this pregnancy, or of post-partum depression. ~ Diagnosis of anxiety and/or depression within last 3 years ~ History of depression due to pregnancy loss/loss of child OR * Patient's symptoms currently being treated with medication and/or therapy. Please contact the Clinical Social Worker if needs arise, or if patient requests.  Hx noted more than 3 years ago.  Please contact CSW if concerns arise.

## 2013-03-22 NOTE — Anesthesia Preprocedure Evaluation (Signed)
Anesthesia Evaluation  Patient identified by MRN, date of birth, ID band Patient awake    Reviewed: Allergy & Precautions, H&P , NPO status , Patient's Chart, lab work & pertinent test results  Airway       Dental   Pulmonary asthma ,          Cardiovascular hypertension,     Neuro/Psych PSYCHIATRIC DISORDERS Anxiety Depression negative neurological ROS     GI/Hepatic negative GI ROS, Neg liver ROS,   Endo/Other  negative endocrine ROS  Renal/GU negative Renal ROS     Musculoskeletal negative musculoskeletal ROS (+) Fibromyalgia -  Abdominal   Peds  Hematology negative hematology ROS (+)   Anesthesia Other Findings   Reproductive/Obstetrics negative OB ROS                           Anesthesia Physical Anesthesia Plan  ASA: II  Anesthesia Plan: Epidural   Post-op Pain Management:    Induction:   Airway Management Planned:   Additional Equipment:   Intra-op Plan:   Post-operative Plan:   Informed Consent: I have reviewed the patients History and Physical, chart, labs and discussed the procedure including the risks, benefits and alternatives for the proposed anesthesia with the patient or authorized representative who has indicated his/her understanding and acceptance.     Plan Discussed with:   Anesthesia Plan Comments:         Anesthesia Quick Evaluation

## 2013-03-22 NOTE — H&P (Signed)
This is Dr. Francoise Ceo dictating the history and physical on  Megan Hunt she's a 27 year old gravida 3 para 202 at 64 weeks and 6 days [facility] 03/30/2013 negative GBS admitted in labor she is 4 cm 80% vertex -3 she is an epidural amniotomy performed the fluids clear IUPC inserted and she is contracting every 3-5 minutes Past medical history negative Past surgical history negative Social history negative System review negative Physical exam well-developed female in labor HEENT negative Lungs clear to P&A Breasts negative Heart regular rhythm no murmurs no gallops Abdomen term Pelvic as described above Extremities negative and and

## 2013-03-23 LAB — CBC
MCHC: 33.6 g/dL (ref 30.0–36.0)
MCV: 82.3 fL (ref 78.0–100.0)
RDW: 13.8 % (ref 11.5–15.5)
WBC: 15.7 10*3/uL — ABNORMAL HIGH (ref 4.0–10.5)

## 2013-03-23 NOTE — Anesthesia Postprocedure Evaluation (Signed)
Anesthesia Post Note  Patient: Megan Hunt  Procedure(s) Performed: * No procedures listed *  Anesthesia type: Epidural  Patient location: Mother/Baby  Post pain: Pain level controlled  Post assessment: Post-op Vital signs reviewed  Last Vitals:  Filed Vitals:   03/23/13 0539  BP: 129/79  Pulse: 80  Temp: 36.5 C  Resp: 18    Post vital signs: Reviewed  Level of consciousness:alert  Complications: No apparent anesthesia complications

## 2013-03-23 NOTE — Progress Notes (Signed)
Patient ID: Megan Hunt, female   DOB: [birth year], 27 y.o.   MRN: [id number] Postpartum day one Vital signs normal Fundus firm Lochia moderate Doing well and

## 2013-03-23 NOTE — Progress Notes (Signed)
UR chart review completed.  

## 2013-03-23 NOTE — Progress Notes (Signed)
Patient was referred for history of depression/anxiety.  * Referral screened out by Clinical Social Worker because none of the following criteria appear to apply:  ~ History of anxiety/depression during this pregnancy, or of post-partum depression.  ~ Diagnosis of anxiety and/or depression within last 4 years, per chart review.  ~ History of depression due to pregnancy loss/loss of child  OR  * Patient's symptoms currently being treated with medication and/or therapy.  Please contact the Clinical Social Worker if needs arise, or by the patient's request.   

## 2013-03-24 NOTE — Discharge Summary (Signed)
Obstetric Discharge Summary Reason for Admission: onset of labor Prenatal Procedures: none Intrapartum Procedures: spontaneous vaginal delivery Postpartum Procedures: none Complications-Operative and Postpartum: none Hemoglobin  Date Value Range Status  03/23/2013 11.1* 12.0 - 15.0 g/dL Final     HCT  Date Value Range Status  03/23/2013 33.0* 36.0 - 46.0 % Final    Physical Exam:  General: alert Lochia: appropriate Uterine Fundus: firm Incision: healing well DVT Evaluation: No evidence of DVT seen on physical exam.  Discharge Diagnoses: Term Pregnancy-delivered  Discharge Information: Date: 03/24/2013 Activity: pelvic rest Diet: routine Medications: Percocet Condition: stable Instructions: refer to practice specific booklet Discharge to: home Follow-up Information   Follow up with Kathreen Cosier, MD.   Specialty:  Obstetrics and Gynecology   Contact information:   [address] [place] [address] [phone]       Newborn Data: Live born female  Birth Weight: 6 lb 11.9 oz (3060 g) APGAR: 8, 9  Home with mother.  Conor Filsaime A 03/24/2013, 7:12 AM

## 2013-06-15 ENCOUNTER — Ambulatory Visit (INDEPENDENT_AMBULATORY_CARE_PROVIDER_SITE_OTHER): Payer: 59 | Admitting: Pulmonary Disease

## 2013-06-15 ENCOUNTER — Encounter: Payer: Self-pay | Admitting: Pulmonary Disease

## 2013-06-15 ENCOUNTER — Ambulatory Visit (INDEPENDENT_AMBULATORY_CARE_PROVIDER_SITE_OTHER): Payer: 59

## 2013-06-15 VITALS — BP 138/100 | HR 77 | Temp 98.2°F | Ht 65.0 in | Wt 189.0 lb

## 2013-06-15 DIAGNOSIS — I10 Essential (primary) hypertension: Secondary | ICD-10-CM

## 2013-06-15 DIAGNOSIS — Z87898 Personal history of other specified conditions: Secondary | ICD-10-CM

## 2013-06-15 DIAGNOSIS — J309 Allergic rhinitis, unspecified: Secondary | ICD-10-CM

## 2013-06-15 DIAGNOSIS — F341 Dysthymic disorder: Secondary | ICD-10-CM

## 2013-06-15 DIAGNOSIS — M797 Fibromyalgia: Secondary | ICD-10-CM

## 2013-06-15 DIAGNOSIS — K5900 Constipation, unspecified: Secondary | ICD-10-CM

## 2013-06-15 DIAGNOSIS — Z0000 Encounter for general adult medical examination without abnormal findings: Secondary | ICD-10-CM

## 2013-06-15 DIAGNOSIS — J45909 Unspecified asthma, uncomplicated: Secondary | ICD-10-CM

## 2013-06-15 DIAGNOSIS — IMO0001 Reserved for inherently not codable concepts without codable children: Secondary | ICD-10-CM

## 2013-06-15 DIAGNOSIS — E7800 Pure hypercholesterolemia, unspecified: Secondary | ICD-10-CM

## 2013-06-15 LAB — CBC WITH DIFFERENTIAL/PLATELET
BASOS PCT: 0.4 % (ref 0.0–3.0)
Basophils Absolute: 0 10*3/uL (ref 0.0–0.1)
EOS ABS: 0.1 10*3/uL (ref 0.0–0.7)
EOS PCT: 1.5 % (ref 0.0–5.0)
HEMATOCRIT: 39.3 % (ref 36.0–46.0)
Hemoglobin: 12.8 g/dL (ref 12.0–15.0)
Lymphocytes Relative: 33.4 % (ref 12.0–46.0)
Lymphs Abs: 3 10*3/uL (ref 0.7–4.0)
MCHC: 32.5 g/dL (ref 30.0–36.0)
MCV: 85.2 fl (ref 78.0–100.0)
MONO ABS: 0.6 10*3/uL (ref 0.1–1.0)
Monocytes Relative: 7.1 % (ref 3.0–12.0)
Neutro Abs: 5.1 10*3/uL (ref 1.4–7.7)
Neutrophils Relative %: 57.6 % (ref 43.0–77.0)
Platelets: 299 10*3/uL (ref 150.0–400.0)
RBC: 4.62 Mil/uL (ref 3.87–5.11)
RDW: 13.8 % (ref 11.5–14.6)
WBC: 8.9 10*3/uL (ref 4.5–10.5)

## 2013-06-15 LAB — TSH: TSH: 1.03 u[IU]/mL (ref 0.35–5.50)

## 2013-06-15 MED ORDER — TRAMADOL HCL 50 MG PO TABS: 50.0000 mg | ORAL_TABLET | Freq: Three times a day (TID) | ORAL | Status: DC | PRN

## 2013-06-15 MED ORDER — ATENOLOL 50 MG PO TABS: 50.0000 mg | ORAL_TABLET | Freq: Every day | ORAL | Status: DC

## 2013-06-15 NOTE — Patient Instructions (Signed)
Today we updated your med list in our EPIC system...     Megan Hunt, it was good seeing you again- and congrats on the birth of [name]...  For your BP>>    Let's get back on the ATENOLOL 50mg  - one tab daily (this can also help prevent migraines)...   Work on Raytheonweight reduction> eliminate salt from your diet, count calories and increase your exercise...  For the pain in your right hip area>>    Use the heating pad as needed...    Try the new TRAMADOL 50mg - one tab up to 3 times daily as needed for the pain...  Today we did your follow up FASTING blood work...    We will contact you w/ the results when available...   Call for any questions...  Let's be sure to have a BP check in 2-3 months.Marland Kitchen..Marland Kitchen

## 2013-06-16 ENCOUNTER — Encounter: Payer: Self-pay | Admitting: Pulmonary Disease
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
## 2013-06-18 LAB — HEPATIC FUNCTION PANEL
ALT: 26 U/L (ref 0–35)
AST: 28 U/L (ref 0–37)
Albumin: 4.6 g/dL (ref 3.5–5.2)
Alkaline Phosphatase: 133 U/L — ABNORMAL HIGH (ref 39–117)
BILIRUBIN DIRECT: 0.1 mg/dL (ref 0.0–0.3)
Total Bilirubin: 0.5 mg/dL (ref 0.3–1.2)
Total Protein: 8.2 g/dL (ref 6.0–8.3)

## 2013-06-18 LAB — LIPID PANEL
CHOL/HDL RATIO: 4
Cholesterol: 258 mg/dL — ABNORMAL HIGH (ref 0–200)
HDL: 67.8 mg/dL (ref >39.00)
Triglycerides: 84 mg/dL (ref 0.0–149.0)
VLDL: 16.8 mg/dL (ref 0.0–40.0)

## 2013-06-18 LAB — BASIC METABOLIC PANEL
BUN: 11 mg/dL (ref 6–23)
CO2: 27 mEq/L (ref 19–32)
Calcium: 9.5 mg/dL (ref 8.4–10.5)
Chloride: 106 mEq/L (ref 96–112)
Creatinine, Ser: 0.8 mg/dL (ref 0.4–1.2)
GFR: 103.67 mL/min (ref >60.00)
Glucose, Bld: 75 mg/dL (ref 70–99)
POTASSIUM: 4.1 meq/L (ref 3.5–5.1)
SODIUM: 140 meq/L (ref 135–145)

## 2013-06-18 LAB — LDL CHOLESTEROL, DIRECT: Direct LDL: 177.4 mg/dL

## 2013-10-21 ENCOUNTER — Ambulatory Visit (INDEPENDENT_AMBULATORY_CARE_PROVIDER_SITE_OTHER): Payer: 59 | Admitting: Internal Medicine

## 2013-10-21 VITALS — BP 120/88 | HR 92 | Temp 98.1°F | Resp 16 | Ht 63.5 in | Wt 181.0 lb

## 2013-10-21 DIAGNOSIS — R05 Cough: Secondary | ICD-10-CM

## 2013-10-21 DIAGNOSIS — R509 Fever, unspecified: Secondary | ICD-10-CM

## 2013-10-21 DIAGNOSIS — R197 Diarrhea, unspecified: Secondary | ICD-10-CM

## 2013-10-21 DIAGNOSIS — M791 Myalgia, unspecified site: Secondary | ICD-10-CM

## 2013-10-21 DIAGNOSIS — IMO0001 Reserved for inherently not codable concepts without codable children: Secondary | ICD-10-CM

## 2013-10-21 DIAGNOSIS — R059 Cough, unspecified: Secondary | ICD-10-CM

## 2013-10-21 LAB — POCT CBC
GRANULOCYTE PERCENT: 63.1 % (ref 37–80)
HCT, POC: 39.9 % (ref 37.7–47.9)
Hemoglobin: 12.7 g/dL (ref 12.2–16.2)
Lymph, poc: 2.3 (ref 0.6–3.4)
MCH, POC: 27.9 pg (ref 27–31.2)
MCHC: 31.8 g/dL (ref 31.8–35.4)
MCV: 87.6 fL (ref 80–97)
MID (CBC): 0.9 (ref 0–0.9)
MPV: 10 fL (ref 0–99.8)
PLATELET COUNT, POC: 273 10*3/uL (ref 142–424)
POC GRANULOCYTE: 5.5 (ref 2–6.9)
POC LYMPH %: 26.6 % (ref 10–50)
POC MID %: 10.3 %M (ref 0–12)
RBC: 4.55 M/uL (ref 4.04–5.48)
RDW, POC: 12.6 %
WBC: 8.7 10*3/uL (ref 4.6–10.2)

## 2013-10-21 MED ORDER — MELOXICAM 15 MG PO TABS: 15.0000 mg | ORAL_TABLET | Freq: Every day | ORAL | Status: DC

## 2013-10-21 MED ORDER — AMOXICILLIN 875 MG PO TABS: 875.0000 mg | ORAL_TABLET | Freq: Two times a day (BID) | ORAL | Status: DC

## 2013-10-21 NOTE — Progress Notes (Signed)
   Subjective:    Patient ID: Megan Hunt, female    DOB: [birth year], 28 y.o.   MRN: [id number]  HPI 24 hour history of cough, headache, postnasal drainage, diarrhea x3, and muscle aches in the neck shoulders and legs No chills or fever definitely has clamminess and sweating No nausea and vomiting No abdominal pain No dysuria frequency urgency or nocturia  Patient Active Problem List   Diagnosis Date Noted  . Placenta previa antepartum in second trimester 10/31/2012  . Hemorrhoids, external 01/05/2011  . Fibromyalgia 01/05/2011  . CONSTIPATION 01/03/2010  . SKIN RASH 11/24/2009  . HYPERTENSION 09/29/2009  . HYPERCHOLESTEROLEMIA 10/27/2008  . ANXIETY DEPRESSION 10/27/2008  . ALLERGIC RHINITIS 10/27/2008  . ASTHMA 10/27/2008  . BACK PAIN 10/27/2008  . SOMATIC DYSFUNCTION 10/27/2008  . CHEST PAIN, ATYPICAL 10/27/2008  . MIGRAINES, HX OF--- recently put on tramadol awaiting [facility]  03/29/2007  Current outpatient prescriptions:atenolol (TENORMIN) 50 MG tablet, Take 1 tablet (50 mg total) by mouth daily., Disp: 90 tablet, Rfl: 3;  traMADol (ULTRAM) 50 MG tablet, Take 1 tablet (50 mg total) by mouth 3 (three) times daily as needed for moderate pain., Disp: 90 tablet, Rfl: 5  Not pregnant or nursing Review of Systems No change in headaches  No vision changes  No sore throat  No back pain or flank pain  No joint swelling or redness     Objective:   Physical Exam BP 120/88  Pulse 92  Temp(Src) 98.1 F (36.7 C) (Oral)  Resp 16  Ht 5' 3.5" (1.613 m)  Wt 181 lb (82.101 kg)  BMI 31.56 kg/m2  SpO2 98%  LMP 09/17/2013 TMs clear Pupils equal round reactive to light and accommodation/conjunctiva slightly injected/EOMs conjugate Nares boggy with purulent discharge Tender maxillary areas Throat clear Tender anterior cervical areas but no lymphadenopathy Chest clear to auscultation Heart regular without murmur Abdomen benign Tender and the  thighs anteriorly and posteriorly without mass or defect Tender in the neck and trapezius areas but good range of motion of the neck Tender in the deltoids but good range of motion of the shoulders No rash Cranial nerves II through XII intact       Assessment & Plan:  Sinusitis Myalgia -  Cough -  Fever Diarrhea  Meds ordered this encounter  Medications  . amoxicillin (AMOXIL) 875 MG tablet    Sig: Take 1 tablet (875 mg total) by mouth 2 (two) times daily.    Dispense:  20 tablet    Refill:  0  . meloxicam (MOBIC) 15 MG tablet    Sig: Take 1 tablet (15 mg total) by mouth daily. for muscle pain    Dispense:  10 tablet    Refill:  0

## 2013-11-20 ENCOUNTER — Telehealth: Payer: Self-pay | Admitting: Pulmonary Disease

## 2013-11-20 NOTE — Telephone Encounter (Signed)
Called spoke with pt mother. Made her aware [facility].  Nothing further needed.

## 2014-01-03 ENCOUNTER — Telehealth: Payer: Self-pay | Admitting: Pulmonary Disease

## 2014-01-03 MED ORDER — TRAMADOL HCL 50 MG PO TABS: 50.0000 mg | ORAL_TABLET | Freq: Three times a day (TID) | ORAL | Status: DC | PRN

## 2014-01-03 NOTE — Telephone Encounter (Signed)
Called and spoke with pt and she is aware of refill that has been called to her pharmacy.  Nothing further is needed.  

## 2014-01-06 ENCOUNTER — Other Ambulatory Visit: Payer: Self-pay | Admitting: Pulmonary Disease

## 2014-06-25 ENCOUNTER — Telehealth: Payer: Self-pay | Admitting: Pulmonary Disease

## 2014-06-25 MED ORDER — TRAMADOL HCL 50 MG PO TABS: ORAL_TABLET | ORAL | Status: DC

## 2014-06-25 NOTE — Telephone Encounter (Signed)
Per SN---  Ok to refill.  This has been called to the pharmacy and nothing further is needed.

## 2014-06-25 NOTE — Telephone Encounter (Signed)
Called and spoke to pt. Pt is requesting a tramdol refill till pt can be seen by her new PCP, Dr. Abner GreenspanBlyth, in 11/2014. Tramadol (1 TID prn) was last filled on 01/09/2014 for #90 with 1 refill. Last time pt was seen was on 06/15/2013.   SN please advise.   No Known Allergies  Current Outpatient Prescriptions on File Prior to Visit  Medication Sig Dispense Refill  . amoxicillin (AMOXIL) 875 MG tablet Take 1 tablet (875 mg total) by mouth 2 (two) times daily. 20 tablet 0  . atenolol (TENORMIN) 50 MG tablet Take 1 tablet (50 mg total) by mouth daily. 90 tablet 3  . meloxicam (MOBIC) 15 MG tablet Take 1 tablet (15 mg total) by mouth daily. for muscle pain 10 tablet 0  . traMADol (ULTRAM) 50 MG tablet TAKE 1 TABLET BY MOUTH THREE TIMES DAILY AS NEEDED FOR MODERATE PAIN 90 tablet 1   No current facility-administered medications on file prior to visit.

## 2014-08-08 ENCOUNTER — Encounter: Payer: Self-pay | Admitting: Medical

## 2014-08-08 ENCOUNTER — Ambulatory Visit (INDEPENDENT_AMBULATORY_CARE_PROVIDER_SITE_OTHER): Payer: Managed Care, Other (non HMO) | Admitting: Medical

## 2014-08-08 VITALS — BP 139/78 | HR 99 | Temp 98.2°F | Ht 63.5 in | Wt 156.6 lb

## 2014-08-08 DIAGNOSIS — R5383 Other fatigue: Secondary | ICD-10-CM | POA: Insufficient documentation

## 2014-08-08 DIAGNOSIS — E7800 Pure hypercholesterolemia, unspecified: Secondary | ICD-10-CM

## 2014-08-08 DIAGNOSIS — J0100 Acute maxillary sinusitis, unspecified: Secondary | ICD-10-CM | POA: Diagnosis not present

## 2014-08-08 DIAGNOSIS — D509 Iron deficiency anemia, unspecified: Secondary | ICD-10-CM

## 2014-08-08 MED ORDER — MOMETASONE FUROATE 50 MCG/ACT NA SUSP: NASAL | Status: DC

## 2014-08-08 MED ORDER — CLARITHROMYCIN ER 500 MG PO TB24: 1000.0000 mg | ORAL_TABLET | Freq: Every day | ORAL | Status: DC

## 2014-08-08 NOTE — Progress Notes (Signed)
Subjective:    Patient ID: Megan Hunt, female    DOB: 1986/05/08, 29 y.o.   MRN: [id number]  HPI  I have reviewed pt PMH, PSH, FH, Social History and Surgical History  Allergies- seasonal. Spring is worse. No meds.  Anemia- pt told this month told by gyn on March 5th, 2016 had anemia. She is on Iron.(On menses presently) Some fatigue today and she never got cbc rechecked.  Hyperlipidemia- hx of for more than a year. She is not any medication. Pt never took meds. Never got med rechecked.  Hypertension- Pt bp is moderate high. Recenty high at [facility] over past 6 months. And high at gyn. No neuruologic or cardiac signs or symptoms noted today.  Pt states 2 sinus infection in past 6 months. Pt last treated for infection one month ago. She got augmentin and got some better but not completley. Still congested. She has sneezing and itching eyes. Pt has had some frontal sinus pain  late in day.She is blowing out mucous tinged with blood at times.  Pt also touches bases on fatigue stating profound. So in addition to cbc, will get cmp, and tsh.      Review of Systems  Constitutional: Positive for fatigue. Negative for fever and chills.  HENT: Positive for congestion, postnasal drip, sinus pressure and sneezing. Negative for ear pain.   Eyes: Positive for itching.  Respiratory: Negative for cough, chest tightness, shortness of breath and wheezing.   Cardiovascular: Negative for chest pain and palpitations.  Gastrointestinal: Negative for abdominal pain.  Musculoskeletal: Negative for back pain.  Skin: Negative for pallor.  Neurological: Negative for dizziness, seizures, speech difficulty, weakness and numbness.  Hematological: Negative for adenopathy. Does not bruise/bleed easily.   Past Medical History  Diagnosis Date  . Allergic rhinitis   . Asthma   . Atypical chest pain   . Hypercholesteremia   . Constipation   . Back pain   . Somatic dysfunction   . History of migraines    . Anxiety and depression   . Anxiety   . Hypertension     no longer taking meds  . Allergy   . Anemia     History   Social History  . Marital Status: Married    Spouse Name: N/A  . Number of Children: 2  . Years of Education: N/A   Occupational History  . 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      grad [employer] degree in social work   Social History Main Topics  . Smoking status: Never Smoker   . Smokeless tobacco: Never Used  . Alcohol Use: No  . Drug Use: No  . Sexual Activity: Yes    Birth Control/ Protection: None   Other Topics Concern  . Not on file   Social History Narrative   Son Swazilandjordan born 02/2008 and daughter Penne Lashjamaya born 06/2009   Ivin BootyJoshua born 03/22/2013    Past Surgical History  Procedure Laterality Date  . Wisdom tooth extraction      Family History  Problem Relation Age of Onset  . Hypertension Father     No Known Allergies  Current Outpatient Prescriptions on File Prior to Visit  Medication Sig Dispense Refill  . atenolol (TENORMIN) 50 MG tablet Take 1 tablet (50 mg total) by mouth daily. 90 tablet 3  . traMADol (ULTRAM) 50 MG tablet TAKE 1 TABLET BY MOUTH THREE TIMES DAILY AS NEEDED FOR MODERATE PAIN 90  tablet 5   No current facility-administered medications on file prior to visit.    BP 139/78 mmHg  Pulse 99  Temp(Src) 98.2 F (36.8 C) (Oral)  Ht 5' 3.5" (1.613 m)  Wt 156 lb 9.6 oz (71.033 kg)  BMI 27.30 kg/m2  SpO2 100%  LMP 08/06/2014  Breastfeeding? No      Objective:   Physical Exam  General Mental Status- Alert. General Appearance- Not in acute distress.   Skin General: Color- Normal Color. Moisture- Normal Moisture.  Neck Carotid Arteries- Normal color. Moisture- Normal Moisture. No carotid bruits. No JVD. No thryomegaly   HEENT Head- Normal. Ear Auditory Canal - Left- Normal. Right - Normal.Tympanic Membrane- Left- Normal. Right- Normal. Eye Sclera/Conjunctiva- Left- Normal. Right- Normal. Nose &  Sinuses Nasal Mucosa- Left-  Boggy and Congested. Right-  Boggy and  Congested. Faint  maxillary and frontal sinus pressure. Mouth & Throat Lips: Upper Lip- Normal: no dryness, cracking, pallor, cyanosis, or vesicular eruption. Lower Lip-Normal: no dryness, cracking, pallor, cyanosis or vesicular eruption. Buccal Mucosa- Bilateral- No Aphthous ulcers. Oropharynx- No Discharge or Erythema. + pnd. Tonsils: Characteristics- Bilateral- No Erythema or Congestion. Size/Enlargement- Bilateral- No enlargement. Discharge- bilateral-None.     Chest and Lung Exam Auscultation: Breath Sounds:-Normal.  Cardiovascular Auscultation:Rythm- Regular. Murmurs & Other Heart Sounds:Auscultation of the heart reveals- No Murmurs.   Neurologic CN III- XII grossly intact. Strength:- 5/5 equal and symmetric strength both upper and lower extremities.      Assessment & Plan:

## 2014-08-08 NOTE — Assessment & Plan Note (Signed)
Repeat/recheck in one mont fasting.

## 2014-08-08 NOTE — Assessment & Plan Note (Addendum)
With some recent allergy signs or symptoms. Rx biaxin xl. Also get allegra otc. Rx nasonex for nasal congestion.  Any persisting signs or symptoms then follow up in 7 days.

## 2014-08-08 NOTE — Assessment & Plan Note (Signed)
With hx of anemia. Get cbc,cmp and tsh. Continue iron rx.

## 2014-08-08 NOTE — Progress Notes (Signed)
Pre visit review using our clinic review tool, if applicable. No additional management support is needed unless otherwise documented below in the visit note. 

## 2014-08-08 NOTE — Assessment & Plan Note (Signed)
Continue current med. Recheck on follow up. Borderline high on systolic.

## 2014-08-08 NOTE — Patient Instructions (Signed)
HYPERTENSION Continue current med. Recheck on follow up. Borderline high on systolic.   HYPERCHOLESTEROLEMIA Repeat/recheck in one mont fasting.   Sinusitis, acute maxillary With some recent allergy signs or symptoms. Rx biaxin xl. Also get allegra otc. Rx nasonex for nasal congestion.  Any persisting signs or symptoms then follow up in 7 days.   Fatigue With hx of anemia. Get cbc,cmp and tsh. Continue iron rx.     On next visit ask you come in fasting so we can get lipid panel. If you don't come in for sinus follow up due to improvement then come in 1 month.

## 2014-08-09 LAB — COMPREHENSIVE METABOLIC PANEL
ALBUMIN: 4.3 g/dL (ref 3.5–5.2)
ALT: 10 U/L (ref 0–35)
AST: 15 U/L (ref 0–37)
Alkaline Phosphatase: 111 U/L (ref 39–117)
BUN: 9 mg/dL (ref 6–23)
CO2: 30 meq/L (ref 19–32)
Calcium: 9.3 mg/dL (ref 8.4–10.5)
Chloride: 106 mEq/L (ref 96–112)
Creatinine, Ser: 0.74 mg/dL (ref 0.40–1.20)
GFR: 119.04 mL/min (ref >60.00)
Glucose, Bld: 101 mg/dL — ABNORMAL HIGH (ref 70–99)
POTASSIUM: 4.4 meq/L (ref 3.5–5.1)
SODIUM: 140 meq/L (ref 135–145)
TOTAL PROTEIN: 7.3 g/dL (ref 6.0–8.3)
Total Bilirubin: 0.3 mg/dL (ref 0.2–1.2)

## 2014-08-09 LAB — CBC WITH DIFFERENTIAL/PLATELET
Basophils Absolute: 0.1 10*3/uL (ref 0.0–0.1)
Basophils Relative: 0.8 % (ref 0.0–3.0)
EOS ABS: 0.1 10*3/uL (ref 0.0–0.7)
EOS PCT: 1.4 % (ref 0.0–5.0)
HCT: 37.2 % (ref 36.0–46.0)
HEMOGLOBIN: 12.4 g/dL (ref 12.0–15.0)
LYMPHS PCT: 25.6 % (ref 12.0–46.0)
Lymphs Abs: 2.6 10*3/uL (ref 0.7–4.0)
MCHC: 33.3 g/dL (ref 30.0–36.0)
MCV: 83.3 fl (ref 78.0–100.0)
MONOS PCT: 3.5 % (ref 3.0–12.0)
Monocytes Absolute: 0.4 10*3/uL (ref 0.1–1.0)
NEUTROS ABS: 7 10*3/uL (ref 1.4–7.7)
NEUTROS PCT: 68.7 % (ref 43.0–77.0)
Platelets: 301 10*3/uL (ref 150.0–400.0)
RBC: 4.47 Mil/uL (ref 3.87–5.11)
RDW: 13 % (ref 11.5–15.5)
WBC: 10.2 10*3/uL (ref 4.0–10.5)

## 2014-08-09 LAB — TSH: TSH: 0.83 u[IU]/mL (ref 0.35–4.50)

## 2014-10-23 ENCOUNTER — Telehealth: Payer: Self-pay | Admitting: *Deleted

## 2014-10-23 NOTE — Telephone Encounter (Signed)
Unable to reach patient at time of Pre-Visit Call.  Unable to leave voicemail, message states "your call cannot be completed as dialed"

## 2014-10-25 ENCOUNTER — Ambulatory Visit (INDEPENDENT_AMBULATORY_CARE_PROVIDER_SITE_OTHER): Payer: Managed Care, Other (non HMO) | Admitting: Family Medicine

## 2014-10-25 ENCOUNTER — Encounter: Payer: Self-pay | Admitting: Family Medicine

## 2014-10-25 VITALS — BP 124/82 | HR 90 | Temp 98.6°F | Ht 64.0 in | Wt 157.4 lb

## 2014-10-25 DIAGNOSIS — K5900 Constipation, unspecified: Secondary | ICD-10-CM

## 2014-10-25 DIAGNOSIS — G43809 Other migraine, not intractable, without status migrainosus: Secondary | ICD-10-CM

## 2014-10-25 DIAGNOSIS — K644 Residual hemorrhoidal skin tags: Secondary | ICD-10-CM

## 2014-10-25 DIAGNOSIS — J309 Allergic rhinitis, unspecified: Secondary | ICD-10-CM | POA: Diagnosis not present

## 2014-10-25 DIAGNOSIS — J0100 Acute maxillary sinusitis, unspecified: Secondary | ICD-10-CM | POA: Diagnosis not present

## 2014-10-25 DIAGNOSIS — I10 Essential (primary) hypertension: Secondary | ICD-10-CM | POA: Diagnosis not present

## 2014-10-25 DIAGNOSIS — R0789 Other chest pain: Secondary | ICD-10-CM

## 2014-10-25 DIAGNOSIS — K648 Other hemorrhoids: Secondary | ICD-10-CM | POA: Diagnosis not present

## 2014-10-25 LAB — CBC
HCT: 37.8 % (ref 36.0–46.0)
Hemoglobin: 12.3 g/dL (ref 12.0–15.0)
MCHC: 32.4 g/dL (ref 30.0–36.0)
MCV: 85.2 fl (ref 78.0–100.0)
PLATELETS: 255 10*3/uL (ref 150.0–400.0)
RBC: 4.44 Mil/uL (ref 3.87–5.11)
RDW: 12.9 % (ref 11.5–15.5)
WBC: 6.3 10*3/uL (ref 4.0–10.5)

## 2014-10-25 LAB — LIPID PANEL
Cholesterol: 199 mg/dL (ref 0–200)
HDL: 62.5 mg/dL (ref >39.00)
LDL CALC: 119 mg/dL — AB (ref 0–99)
NonHDL: 136.5
TRIGLYCERIDES: 88 mg/dL (ref 0.0–149.0)
Total CHOL/HDL Ratio: 3
VLDL: 17.6 mg/dL (ref 0.0–40.0)

## 2014-10-25 LAB — COMPREHENSIVE METABOLIC PANEL
ALBUMIN: 4.4 g/dL (ref 3.5–5.2)
ALT: 10 U/L (ref 0–35)
AST: 17 U/L (ref 0–37)
Alkaline Phosphatase: 98 U/L (ref 39–117)
BUN: 8 mg/dL (ref 6–23)
CO2: 28 meq/L (ref 19–32)
CREATININE: 0.74 mg/dL (ref 0.40–1.20)
Calcium: 9.3 mg/dL (ref 8.4–10.5)
Chloride: 102 mEq/L (ref 96–112)
GFR: 118.86 mL/min (ref >60.00)
Glucose, Bld: 78 mg/dL (ref 70–99)
POTASSIUM: 3.8 meq/L (ref 3.5–5.1)
SODIUM: 135 meq/L (ref 135–145)
TOTAL PROTEIN: 7 g/dL (ref 6.0–8.3)
Total Bilirubin: 0.5 mg/dL (ref 0.2–1.2)

## 2014-10-25 LAB — TSH: TSH: 0.76 u[IU]/mL (ref 0.35–4.50)

## 2014-10-25 MED ORDER — FLUTICASONE PROPIONATE 50 MCG/ACT NA SUSP: 2.0000 | Freq: Every day | NASAL | Status: DC

## 2014-10-25 MED ORDER — ATENOLOL 50 MG PO TABS: 75.0000 mg | ORAL_TABLET | Freq: Every day | ORAL | Status: DC

## 2014-10-25 MED ORDER — MONTELUKAST SODIUM 10 MG PO TABS: 10.0000 mg | ORAL_TABLET | Freq: Every day | ORAL | Status: DC

## 2014-10-25 MED ORDER — BACLOFEN 10 MG PO TABS
10.0000 mg | ORAL_TABLET | Freq: Every day | ORAL | Status: DC | PRN
Start: 2014-10-25 — End: 2015-03-11

## 2014-10-25 NOTE — Progress Notes (Signed)
Pre visit review using our clinic review tool, if applicable. No additional management support is needed unless otherwise documented below in the visit note. 

## 2014-10-25 NOTE — Assessment & Plan Note (Addendum)
Long history of intermittent sharp chest pain, fleeting, no pattern to occurrence. No SOB. Can get some nausea and sweaty feeling can occur simultaneously. Occurs once a month. Unlikely cardiac but will repeat EKG today, which is unremarkable, she will seek care if pain escalates or changes

## 2014-10-25 NOTE — Patient Instructions (Addendum)
Salon Pas gel, hydration Vitamin D 2000 IU daily Probiotic Krill oil or fish oil daily Curcumen caps daily for pain  Check and see if any nasal steroids are covered by your insurance plan  Encouraged increased hydration and fiber such as Benefiber in diet. Daily probiotics, such as Digestive Advantage or [facility]. If bowels not moving can use MOM 2 tbls po in 4 oz of warm prune juice by mouth every 2-3 days. If no results then repeat in 4 hours with  Dulcolax suppository pr, may repeat again in 4 more hours as needed. Seek care if symptoms worsen. Consider daily Miralax and/or Dulcolax if symptoms persist. Increase  Allergies Allergies may happen from anything your body is sensitive to. This may be food, medicines, pollens, chemicals, and nearly anything around you in everyday life that produces allergens. An allergen is anything that causes an allergy producing substance. Heredity is often a factor in causing these problems. This means you may have some of the same allergies as your parents. Food allergies happen in all age groups. Food allergies are some of the most severe and life threatening. Some common food allergies are cow's milk, seafood, eggs, nuts, wheat, and soybeans. SYMPTOMS   Swelling around the mouth.  An itchy red rash or hives.  Vomiting or diarrhea.  Difficulty breathing. SEVERE ALLERGIC REACTIONS ARE LIFE-THREATENING. This reaction is called anaphylaxis. It can cause the mouth and throat to swell and cause difficulty with breathing and swallowing. In severe reactions only a trace amount of food (for example, peanut oil in a salad) may cause death within seconds. Seasonal allergies occur in all age groups. These are seasonal because they usually occur during the same season every year. They may be a reaction to molds, grass pollens, or tree pollens. Other causes of problems are house dust mite allergens, pet dander, and mold spores. The symptoms often consist  of nasal congestion, a runny itchy nose associated with sneezing, and tearing itchy eyes. There is often an associated itching of the mouth and ears. The problems happen when you come in contact with pollens and other allergens. Allergens are the particles in the air that the body reacts to with an allergic reaction. This causes you to release allergic antibodies. Through a chain of events, these eventually cause you to release histamine into the blood stream. Although it is meant to be protective to the body, it is this release that causes your discomfort. This is why you were given anti-histamines to feel better. If you are unable to pinpoint the offending allergen, it may be determined by skin or blood testing. Allergies cannot be cured but can be controlled with medicine. Hay fever is a collection of all or some of the seasonal allergy problems. It may often be treated with simple over-the-counter medicine such as diphenhydramine. Take medicine as directed. Do not drink alcohol or drive while taking this medicine. Check with your caregiver or package insert for child dosages. If these medicines are not effective, there are many new medicines your caregiver can prescribe. Stronger medicine such as nasal spray, eye drops, and corticosteroids may be used if the first things you try do not work well. Other treatments such as immunotherapy or desensitizing injections can be used if all else fails. Follow up with your caregiver if problems continue. These seasonal allergies are usually not life threatening. They are generally more of a nuisance that can often be handled using medicine. HOME CARE INSTRUCTIONS   If unsure what  causes a reaction, keep a diary of foods eaten and symptoms that follow. Avoid foods that cause reactions.  If hives or rash are present:  Take medicine as directed.  You may use an over-the-counter antihistamine (diphenhydramine) for hives and itching as needed.  Apply cold  compresses (cloths) to the skin or take baths in cool water. Avoid hot baths or showers. Heat will make a rash and itching worse.  If you are severely allergic:  Following a treatment for a severe reaction, hospitalization is often required for closer follow-up.  Wear a medic-alert bracelet or necklace stating the allergy.  You and your family must learn how to give adrenaline or use an anaphylaxis kit.  If you have had a severe reaction, always carry your anaphylaxis kit or EpiPen with you. Use this medicine as directed by your caregiver if a severe reaction is occurring. Failure to do so could have a fatal outcome. SEEK MEDICAL CARE IF:  You suspect a food allergy. Symptoms generally happen within 30 minutes of eating a food.  Your symptoms have not gone away within 2 days or are getting worse.  You develop new symptoms.  You want to retest yourself or your child with a food or drink you think causes an allergic reaction. Never do this if an anaphylactic reaction to that food or drink has happened before. Only do this under the care of a caregiver. SEEK IMMEDIATE MEDICAL CARE IF:   You have difficulty breathing, are wheezing, or have a tight feeling in your chest or throat.  You have a swollen mouth, or you have hives, swelling, or itching all over your body.  You have had a severe reaction that has responded to your anaphylaxis kit or an EpiPen. These reactions may return when the medicine has worn off. These reactions should be considered life threatening. MAKE SURE YOU:   Understand these instructions.  Will watch your condition.  Will get help right away if you are not doing well or get worse. Document Released: 07/20/2002 Document Revised: 08/21/2012 Document Reviewed: 12/25/2007 [facility] Patient Information 2015 [place], [place]. This information is not intended to replace advice given to you by your health care provider. Make sure you discuss any questions you have with  your health care provider.

## 2014-10-25 NOTE — Assessment & Plan Note (Signed)
Encouraged increased hydration and fiber in diet. Daily probiotics. If bowels not moving can use MOM 2 tbls po in 4 oz of warm prune juice by mouth every 2-3 days. If no results then repeat in 4 hours with  Dulcolax suppository pr, may repeat again in 4 more hours as needed. Seek care if symptoms worsen. Consider daily Miralax and/or Dulcolax if symptoms persist.  

## 2014-10-25 NOTE — Assessment & Plan Note (Signed)
Multifactorial small kids at home encouraged increase sleep, exercise hydration, check labs

## 2014-10-25 NOTE — Assessment & Plan Note (Signed)
Well controlled, no changes to meds. Encouraged heart healthy diet such as the DASH diet and exercise as tolerated.  °

## 2014-10-25 NOTE — Assessment & Plan Note (Signed)
Increase Claritin to daily and even up to bid, add Flonase daily

## 2014-10-25 NOTE — Assessment & Plan Note (Signed)
Encouraged increased hydration, 64 ounces of clear fluids daily. Minimize alcohol and caffeine. Eat small frequent meals with lean proteins and complex carbs. Avoid high and low blood sugars. Get adequate sleep, 7-8 hours a night. Needs exercise daily preferably in the morning. Gets photophobia, phonophobia, n/v. Rest and a dark room helps. Ibuprofen does not help. Took Topamax and Baclofen in past, baclofen helped at time.

## 2014-10-25 NOTE — Assessment & Plan Note (Signed)
Occurred with second pregnancy flares occasionally no blood asked to monitor and report any bleeding

## 2014-10-27 NOTE — Progress Notes (Signed)
Megan Hunt  [id number] 16-Jan-1986 10/27/2014      Progress Note-Follow Up  Subjective  Chief Complaint  Chief Complaint  Patient presents with  . Establish Care    HPI  Patient is a 29 y.o. female in today for routine medical care. Patient is in today for new patient appointment. Notes a long history of constipation and looser bowels with some difficulty small amounts that are hard no more than twice a week. She's had roughly 4 sinus infections in the last year most recently a couple weeks ago she had frequent bloody nose and [place] mucus out of her nose. She's also complaining of bilateral knee pain, atypical chest pain which is fleeting without associated symptoms. No fevers or chills. Denies palp/SOB/HA/congestion/fevers or GU c/o. Taking meds as prescribed  Past Medical History  Diagnosis Date  . Allergic rhinitis   . Asthma   . Atypical chest pain   . Hypercholesteremia   . Constipation   . Back pain   . Somatic dysfunction   . History of migraines   . Anxiety and depression   . Anxiety   . Hypertension     no longer taking meds  . Allergy   . Anemia   . Asthma, mild intermittent 10/27/2008    Qualifier: Diagnosis of  By: Kriste Basque MD, Lonzo Cloud     Past Surgical History  Procedure Laterality Date  . Wisdom tooth extraction      Family History  Problem Relation Age of Onset  . Hypertension Father   . Arthritis Brother   . Stroke Maternal Grandfather   . Hypertension Maternal Grandfather     History   Social History  . Marital Status: Married    Spouse Name: N/A  . Number of Children: 2  . Years of Education: N/A   Occupational History  . 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      grad [employer] degree in social work   Social History Main Topics  . Smoking status: Never Smoker   . Smokeless tobacco: Never Used  . Alcohol Use: No  . Drug Use: No  . Sexual Activity: Yes    Birth Control/ Protection: None     Comment: lives with husband  and 3 small children, no dietary restrictions, stays at home   Other Topics Concern  . Not on file   Social History Narrative   Son [name] born 02/2008 and daughter [name] born 06/2009   Ivin Booty born 03/22/2013    Current Outpatient Prescriptions on File Prior to Visit  Medication Sig Dispense Refill  . traMADol (ULTRAM) 50 MG tablet TAKE 1 TABLET BY MOUTH THREE TIMES DAILY AS NEEDED FOR MODERATE PAIN 90 tablet 5  . ferrous sulfate 325 (65 FE) MG tablet Take 325 mg by mouth daily with breakfast.     No current facility-administered medications on file prior to visit.    No Known Allergies  Review of Systems  Review of Systems  Constitutional: Negative for fever, chills and malaise/fatigue.  HENT: Positive for congestion. Negative for hearing loss and nosebleeds.   Eyes: Negative for discharge.  Respiratory: Negative for cough, sputum production, shortness of breath and wheezing.   Cardiovascular: Positive for chest pain. Negative for palpitations and leg swelling.  Gastrointestinal: Positive for constipation. Negative for heartburn, nausea, vomiting, abdominal pain, diarrhea and blood in stool.  Genitourinary: Negative for dysuria, urgency, frequency and hematuria.  Musculoskeletal: Positive for myalgias and joint pain. Negative  for back pain and falls.  Skin: Negative for rash.  Neurological: Negative for dizziness, tremors, sensory change, focal weakness, loss of consciousness, weakness and headaches.  Endo/Heme/Allergies: Negative for polydipsia. Does not bruise/bleed easily.  Psychiatric/Behavioral: Negative for depression and suicidal ideas. The patient is not nervous/anxious and does not have insomnia.     Objective  BP 124/82 mmHg  Pulse 90  Temp(Src) 98.6 F (37 C) (Oral)  Ht 5\' 4"  (1.626 m)  Wt 157 lb 6 oz (71.385 kg)  BMI 27.00 kg/m2  SpO2 97%  LMP 10/10/2014  Breastfeeding? No  Physical Exam  Physical Exam  Constitutional: She is oriented to person,  place, and time and well-developed, well-nourished, and in no distress. No distress.  HENT:  Head: Normocephalic and atraumatic.  Right Ear: External ear normal.  Left Ear: External ear normal.  Nose: Nose normal.  Mouth/Throat: Oropharynx is clear and moist. No oropharyngeal exudate.  Eyes: Conjunctivae are normal. Pupils are equal, round, and reactive to light. Right eye exhibits no discharge. Left eye exhibits no discharge. No scleral icterus.  Neck: Normal range of motion. Neck supple. No thyromegaly present.  Cardiovascular: Normal rate, regular rhythm, normal heart sounds and intact distal pulses.   No murmur heard. Pulmonary/Chest: Effort normal and breath sounds normal. No respiratory distress. She has no wheezes. She has no rales.  Abdominal: Soft. Bowel sounds are normal. She exhibits no distension and no mass. There is no tenderness.  Musculoskeletal: Normal range of motion. She exhibits no edema or tenderness.  Lymphadenopathy:    She has no cervical adenopathy.  Neurological: She is alert and oriented to person, place, and time. She has normal reflexes. No cranial nerve deficit. Coordination normal.  Skin: Skin is warm and dry. No rash noted. She is not diaphoretic.  Psychiatric: Mood, memory and affect normal.    Lab Results  Component Value Date   TSH 0.76 10/25/2014   Lab Results  Component Value Date   WBC 6.3 10/25/2014   HGB 12.3 10/25/2014   HCT 37.8 10/25/2014   MCV 85.2 10/25/2014   PLT 255.0 10/25/2014   Lab Results  Component Value Date   CREATININE 0.74 10/25/2014   BUN 8 10/25/2014   NA 135 10/25/2014   K 3.8 10/25/2014   CL 102 10/25/2014   CO2 28 10/25/2014   Lab Results  Component Value Date   ALT 10 10/25/2014   AST 17 10/25/2014   ALKPHOS 98 10/25/2014   BILITOT 0.5 10/25/2014   Lab Results  Component Value Date   CHOL 199 10/25/2014   Lab Results  Component Value Date   HDL 62.50 10/25/2014   Lab Results  Component Value Date     LDLCALC 119* 10/25/2014   Lab Results  Component Value Date   TRIG 88.0 10/25/2014   Lab Results  Component Value Date   CHOLHDL 3 10/25/2014     Assessment & Plan  Essential hypertension Well controlled, no changes to meds. Encouraged heart healthy diet such as the DASH diet and exercise as tolerated.   Allergic rhinitis Increase Claritin to daily and even up to bid, add Flonase daily   Hemorrhoids, external Occurred with second pregnancy flares occasionally no blood asked to monitor and report any bleeding  Constipation Encouraged increased hydration and fiber in diet. Daily probiotics. If bowels not moving can use MOM 2 tbls po in 4 oz of warm prune juice by mouth every 2-3 days. If no results then repeat in 4 hours with  Dulcolax suppository pr, may repeat again in 4 more hours as needed. Seek care if symptoms worsen. Consider daily Miralax and/or Dulcolax if symptoms persist.   HYPERCHOLESTEROLEMIA Encouraged increased hydration and fiber in diet. Daily probiotics. If bowels not moving can use MOM 2 tbls po in 4 oz of warm prune juice by mouth every 2-3 days. If no results then repeat in 4 hours with  Dulcolax suppository pr, may repeat again in 4 more hours as needed. Seek care if symptoms worsen. Consider daily Miralax and/or Dulcolax if symptoms persist.   CHEST PAIN, ATYPICAL Long history of intermittent sharp chest pain, fleeting, no pattern to occurrence. No SOB. Can get some nausea and sweaty feeling can occur simultaneously. Occurs once a month. Unlikely cardiac but will repeat EKG today, which is unremarkable, she will seek care if pain escalates or changes  Migraine Encouraged increased hydration, 64 ounces of clear fluids daily. Minimize alcohol and caffeine. Eat small frequent meals with lean proteins and complex carbs. Avoid high and low blood sugars. Get adequate sleep, 7-8 hours a night. Needs exercise daily preferably in the morning. Gets photophobia,  phonophobia, n/v. Rest and a dark room helps. Ibuprofen does not help. Took Topamax and Baclofen in past, baclofen helped at time.  Fatigue Multifactorial small kids at home encouraged increase sleep, exercise hydration, check labs  Sinusitis, acute maxillary 4 infections in past year per patient, take daily allergy meds and start a probiotic, referred to ENT

## 2014-10-27 NOTE — Assessment & Plan Note (Addendum)
4 infections in past year per patient, take daily allergy meds and start a probiotic, referred to ENT

## 2014-11-13 ENCOUNTER — Other Ambulatory Visit: Payer: Self-pay | Admitting: Pulmonary Disease

## 2014-11-29 ENCOUNTER — Ambulatory Visit: Payer: Self-pay | Admitting: Family Medicine

## 2015-01-23 ENCOUNTER — Ambulatory Visit: Payer: Managed Care, Other (non HMO) | Admitting: Family Medicine

## 2015-03-11 ENCOUNTER — Ambulatory Visit (INDEPENDENT_AMBULATORY_CARE_PROVIDER_SITE_OTHER): Payer: Managed Care, Other (non HMO) | Admitting: Family Medicine

## 2015-03-11 ENCOUNTER — Encounter: Payer: Self-pay | Admitting: Family Medicine

## 2015-03-11 VITALS — BP 148/86 | HR 77 | Temp 98.6°F | Ht 64.0 in | Wt 164.4 lb

## 2015-03-11 DIAGNOSIS — I10 Essential (primary) hypertension: Secondary | ICD-10-CM

## 2015-03-11 DIAGNOSIS — M546 Pain in thoracic spine: Secondary | ICD-10-CM | POA: Diagnosis not present

## 2015-03-11 DIAGNOSIS — E7800 Pure hypercholesterolemia, unspecified: Secondary | ICD-10-CM

## 2015-03-11 DIAGNOSIS — R0789 Other chest pain: Secondary | ICD-10-CM

## 2015-03-11 DIAGNOSIS — M549 Dorsalgia, unspecified: Secondary | ICD-10-CM | POA: Diagnosis not present

## 2015-03-11 DIAGNOSIS — Z23 Encounter for immunization: Secondary | ICD-10-CM | POA: Diagnosis not present

## 2015-03-11 MED ORDER — FLUTICASONE PROPIONATE 50 MCG/ACT NA SUSP: 2.0000 | Freq: Every day | NASAL | Status: DC | PRN

## 2015-03-11 MED ORDER — CYCLOBENZAPRINE HCL 10 MG PO TABS: 10.0000 mg | ORAL_TABLET | Freq: Every evening | ORAL | Status: DC | PRN

## 2015-03-11 MED ORDER — TRAMADOL HCL 50 MG PO TABS: ORAL_TABLET | ORAL | Status: DC

## 2015-03-11 MED ORDER — ATENOLOL 100 MG PO TABS: 100.0000 mg | ORAL_TABLET | Freq: Every day | ORAL | Status: DC

## 2015-03-11 NOTE — Patient Instructions (Addendum)
Salon pas gel twice daily  Hypertension Hypertension, commonly called high blood pressure, is when the force of blood pumping through your arteries is too strong. Your arteries are the blood vessels that carry blood from your heart throughout your body. A blood pressure reading consists of a higher number over a lower number, such as 110/72. The higher number (systolic) is the pressure inside your arteries when your heart pumps. The lower number (diastolic) is the pressure inside your arteries when your heart relaxes. Ideally you want your blood pressure below 120/80. Hypertension forces your heart to work harder to pump blood. Your arteries may become narrow or stiff. Having untreated or uncontrolled hypertension can cause heart attack, stroke, kidney disease, and other problems. RISK FACTORS Some risk factors for high blood pressure are controllable. Others are not.  Risk factors you cannot control include:   Race. You may be at higher risk if you are African American.  Age. Risk increases with age.  Gender. Men are at higher risk than women before age 16 years. After age 110, women are at higher risk than men. Risk factors you can control include:  Not getting enough exercise or physical activity.  Being overweight.  Getting too much fat, sugar, calories, or salt in your diet.  Drinking too much alcohol. SIGNS AND SYMPTOMS Hypertension does not usually cause signs or symptoms. Extremely high blood pressure (hypertensive crisis) may cause headache, anxiety, shortness of breath, and nosebleed. DIAGNOSIS To check if you have hypertension, your health care provider will measure your blood pressure while you are seated, with your arm held at the level of your heart. It should be measured at least twice using the same arm. Certain conditions can cause a difference in blood pressure between your right and left arms. A blood pressure reading that is higher than normal on one occasion does not  mean that you need treatment. If it is not clear whether you have high blood pressure, you may be asked to return on a different day to have your blood pressure checked again. Or, you may be asked to monitor your blood pressure at home for 1 or more weeks. TREATMENT Treating high blood pressure includes making lifestyle changes and possibly taking medicine. Living a healthy lifestyle can help lower high blood pressure. You may need to change some of your habits. Lifestyle changes may include:  Following the DASH diet. This diet is high in fruits, vegetables, and whole grains. It is low in salt, red meat, and added sugars.  Keep your sodium intake below 2,300 mg per day.  Getting at least 30-45 minutes of aerobic exercise at least 4 times per week.  Losing weight if necessary.  Not smoking.  Limiting alcoholic beverages.  Learning ways to reduce stress. Your health care provider may prescribe medicine if lifestyle changes are not enough to get your blood pressure under control, and if one of the following is true:  You are 21-64 years of age and your systolic blood pressure is above 140.  You are 70 years of age or older, and your systolic blood pressure is above 150.  Your diastolic blood pressure is above 90.  You have diabetes, and your systolic blood pressure is over 140 or your diastolic blood pressure is over 90.  You have kidney disease and your blood pressure is above 140/90.  You have heart disease and your blood pressure is above 140/90. Your personal target blood pressure may vary depending on your medical conditions, your age,  and other factors. HOME CARE INSTRUCTIONS  Have your blood pressure rechecked as directed by your health care provider.   Take medicines only as directed by your health care provider. Follow the directions carefully. Blood pressure medicines must be taken as prescribed. The medicine does not work as well when you skip doses. Skipping doses also  puts you at risk for problems.  Do not smoke.   Monitor your blood pressure at home as directed by your health care provider. SEEK MEDICAL CARE IF:   You think you are having a reaction to medicines taken.  You have recurrent headaches or feel dizzy.  You have swelling in your ankles.  You have trouble with your vision. SEEK IMMEDIATE MEDICAL CARE IF:  You develop a severe headache or confusion.  You have unusual weakness, numbness, or feel faint.  You have severe chest or abdominal pain.  You vomit repeatedly.  You have trouble breathing. MAKE SURE YOU:   Understand these instructions.  Will watch your condition.  Will get help right away if you are not doing well or get worse.   This information is not intended to replace advice given to you by your health care provider. Make sure you discuss any questions you have with your health care provider.   Document Released: 04/26/2005 Document Revised: 09/10/2014 Document Reviewed: 02/16/2013 Elsevier Interactive Patient Education [employer]2016 Elsevier Inc.

## 2015-03-11 NOTE — Progress Notes (Signed)
Pre visit review using our clinic review tool, if applicable. No additional management support is needed unless otherwise documented below in the visit note. 

## 2015-03-12 ENCOUNTER — Telehealth: Payer: Self-pay | Admitting: Family Medicine

## 2015-03-12 DIAGNOSIS — M546 Pain in thoracic spine: Secondary | ICD-10-CM

## 2015-03-12 NOTE — Telephone Encounter (Signed)
Caller name: [name]   Relationship to patient:Self   Can be reached: [phone]  [id number] [place] [place] - [address]  Reason for call: pt is requesting a refill on her traMADol medication

## 2015-03-13 NOTE — Telephone Encounter (Signed)
Prescription sent in on 03/11/2015

## 2015-03-14 MED ORDER — TRAMADOL HCL 50 MG PO TABS: ORAL_TABLET | ORAL | Status: DC

## 2015-03-14 NOTE — Telephone Encounter (Signed)
Not sure what happened to this refill. But she was seen by you on 03/11/2015 and the refill was set on phone in.  Think it never got to the pharmacy .  Advise please

## 2015-03-14 NOTE — Telephone Encounter (Signed)
Printed and on counter for signature. 

## 2015-03-14 NOTE — Telephone Encounter (Signed)
Relation to pt: self  Call back number:[phone]   Reason for call:  Patient just left pharmacy and pharmacy does not have any more refills. Please call patient directly with the status of Rx

## 2015-03-14 NOTE — Telephone Encounter (Signed)
OK to redo the Rx

## 2015-03-14 NOTE — Telephone Encounter (Signed)
Called the patient to inform once hardcopy is signed will then fax to [facility] [address] [place]

## 2015-03-14 NOTE — Addendum Note (Signed)
Addended by: Scharlene GlossEWING, Siddh Vandeventer B on: 03/14/2015 03:47 PM   Modules accepted: Orders

## 2015-03-23 NOTE — Assessment & Plan Note (Signed)
Encouraged heart healthy diet, increase exercise, avoid trans fats, consider a krill oil cap daily 

## 2015-03-23 NOTE — Assessment & Plan Note (Signed)
long standing offered referral today for evaluation but patient declines, consider topical treatments and referred to ortho

## 2015-03-23 NOTE — Assessment & Plan Note (Signed)
Encouraged moist heat and gentle stretching as tolerated. May try NSAIDs and prescription meds as directed and report if symptoms worsen or seek immediate care 

## 2015-03-23 NOTE — Assessment & Plan Note (Addendum)
Poor control will increase Atenolol to 100 mg daily. Encouraged heart healthy diet such as the DASH diet and exercise as tolerated.

## 2015-03-23 NOTE — Progress Notes (Signed)
Subjective:    Patient ID: Megan Hunt, female    DOB: 1986/02/17, 29 y.o.   MRN: [id number]  Chief Complaint  Patient presents with  . Follow-up    HPI Patient is in today for follow-up. She is struggling with some loose stool but it is improving. 2 weeks ago she was having 5-6 loose watery stool daily. She denies any associated nausea, vomiting, fevers, chills she did have some intermittent lower quadrant pain left greater than right. No bloody or tarry stool. Has a long history of intermittent sharp chest pains and this is presently happening. No associated symptoms. No increase in back pain although back pain is persistent. Denies CP/palp/SOB/HA/congestion/fevers/GI or GU c/o. Taking meds as prescribed  Past Medical History  Diagnosis Date  . Allergic rhinitis   . Asthma   . Atypical chest pain   . Hypercholesteremia   . Constipation   . Back pain   . Somatic dysfunction   . History of migraines   . Anxiety and depression   . Anxiety   . Hypertension     no longer taking meds  . Allergy   . Anemia   . Asthma, mild intermittent 10/27/2008    Qualifier: Diagnosis of  By: Kriste Basque MD, Lonzo Cloud     Past Surgical History  Procedure Laterality Date  . Wisdom tooth extraction      Family History  Problem Relation Age of Onset  . Hypertension Father   . Arthritis Brother   . Stroke Maternal Grandfather   . Hypertension Maternal Grandfather     Social History   Social History  . Marital Status: Married    Spouse Name: N/A  . Number of Children: 2  . Years of Education: N/A   Occupational History  . 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      grad [employer] degree in social work   Social History Main Topics  . Smoking status: Never Smoker   . Smokeless tobacco: Never Used  . Alcohol Use: No  . Drug Use: No  . Sexual Activity: Yes    Birth Control/ Protection: None     Comment: lives with husband and 3 small children, no dietary restrictions, stays  at home   Other Topics Concern  . Not on file   Social History Narrative   Son [name] born 02/2008 and daughter [name] born 06/2009   Ivin Booty born 03/22/2013    Outpatient Prescriptions Prior to Visit  Medication Sig Dispense Refill  . montelukast (SINGULAIR) 10 MG tablet Take 1 tablet (10 mg total) by mouth at bedtime. 30 tablet 3  . atenolol (TENORMIN) 50 MG tablet Take 1.5 tablets (75 mg total) by mouth daily. 45 tablet 3  . baclofen (LIORESAL) 10 MG tablet Take 1-2 tablets (10-20 mg total) by mouth daily as needed for muscle spasms. 30 each 0  . ferrous sulfate 325 (65 FE) MG tablet Take 325 mg by mouth daily with breakfast.    . fluticasone (FLONASE) 50 MCG/ACT nasal spray Place 2 sprays into both nostrils daily. 16 g 6  . traMADol (ULTRAM) 50 MG tablet TAKE 1 TABLET BY MOUTH THREE TIMES DAILY AS NEEDED FOR MODERATE PAIN (Patient not taking: Reported on 03/11/2015) 90 tablet 5   No facility-administered medications prior to visit.    No Known Allergies  Review of Systems  Constitutional: Negative for fever and malaise/fatigue.  HENT: Negative for congestion.   Eyes: Negative for discharge.  Respiratory: Negative for shortness of breath.   Cardiovascular: Negative for chest pain, palpitations and leg swelling.  Gastrointestinal: Positive for diarrhea. Negative for nausea and abdominal pain.  Genitourinary: Negative for dysuria.  Musculoskeletal: Positive for back pain. Negative for falls.  Skin: Negative for rash.  Neurological: Negative for loss of consciousness and headaches.  Endo/Heme/Allergies: Negative for environmental allergies.  Psychiatric/Behavioral: Negative for depression. The patient is not nervous/anxious.        Objective:    Physical Exam  Constitutional: She is oriented to person, place, and time. She appears well-developed and well-nourished. No distress.  HENT:  Head: Normocephalic and atraumatic.  Nose: Nose normal.  Eyes: Right eye exhibits no  discharge. Left eye exhibits no discharge.  Neck: Normal range of motion. Neck supple.  Cardiovascular: Normal rate and regular rhythm.   No murmur heard. Pulmonary/Chest: Effort normal and breath sounds normal.  Abdominal: Soft. Bowel sounds are normal. There is no tenderness.  Musculoskeletal: She exhibits no edema.  Neurological: She is alert and oriented to person, place, and time.  Skin: Skin is warm and dry.  Psychiatric: She has a normal mood and affect.  Nursing note and vitals reviewed.   BP 148/86 mmHg  Pulse 77  Temp(Src) 98.6 F (37 C) (Oral)  Ht  (1.626 m)  Wt 164 lb 6 oz (74.56 kg)  BMI 28.20 kg/m2  SpO2 100%  LMP 02/11/2015  Breastfeeding? No Wt Readings from Last 3 Encounters:  03/11/15 164 lb 6 oz (74.56 kg)  10/25/14 157 lb 6 oz (71.385 kg)  08/08/14 156 lb 9.6 oz (71.033 kg)     Lab Results  Component Value Date   WBC 6.3 10/25/2014   HGB 12.3 10/25/2014   HCT 37.8 10/25/2014   PLT 255.0 10/25/2014   GLUCOSE 78 10/25/2014   CHOL 199 10/25/2014   TRIG 88.0 10/25/2014   HDL 62.50 10/25/2014   LDLDIRECT 177.4 06/15/2013   LDLCALC 119* 10/25/2014   ALT 10 10/25/2014   AST 17 10/25/2014   NA 135 10/25/2014   K 3.8 10/25/2014   CL 102 10/25/2014   CREATININE 0.74 10/25/2014   BUN 8 10/25/2014   CO2 28 10/25/2014   TSH 0.76 10/25/2014    Lab Results  Component Value Date   TSH 0.76 10/25/2014   Lab Results  Component Value Date   WBC 6.3 10/25/2014   HGB 12.3 10/25/2014   HCT 37.8 10/25/2014   MCV 85.2 10/25/2014   PLT 255.0 10/25/2014   Lab Results  Component Value Date   NA 135 10/25/2014   K 3.8 10/25/2014   CO2 28 10/25/2014   GLUCOSE 78 10/25/2014   BUN 8 10/25/2014   CREATININE 0.74 10/25/2014   BILITOT 0.5 10/25/2014   ALKPHOS 98 10/25/2014   AST 17 10/25/2014   ALT 10 10/25/2014   PROT 7.0 10/25/2014   ALBUMIN 4.4 10/25/2014   CALCIUM 9.3 10/25/2014   GFR 118.86 10/25/2014   Lab Results  Component Value Date     CHOL 199 10/25/2014   Lab Results  Component Value Date   HDL 62.50 10/25/2014   Lab Results  Component Value Date   LDLCALC 119* 10/25/2014   Lab Results  Component Value Date   TRIG 88.0 10/25/2014   Lab Results  Component Value Date   CHOLHDL 3 10/25/2014   No results found for: HGBA1C     Assessment & Plan:   Problem List Items Addressed This Visit    HYPERCHOLESTEROLEMIA  Encouraged heart healthy diet, increase exercise, avoid trans fats, consider a krill oil cap daily      Relevant Medications   atenolol (TENORMIN) 100 MG tablet   Essential hypertension    Poor control will increase Atenolol to 100 mg daily. Encouraged heart healthy diet such as the DASH diet and exercise as tolerated.       Relevant Medications   atenolol (TENORMIN) 100 MG tablet   CHEST PAIN, ATYPICAL    long standing offered referral today for evaluation but patient declines, consider topical treatments and referred to ortho      Backache    Encouraged moist heat and gentle stretching as tolerated. May try NSAIDs and prescription meds as directed and report if symptoms worsen or seek immediate care      Relevant Medications   cyclobenzaprine (FLEXERIL) 10 MG tablet    Other Visit Diagnoses    Encounter for immunization    -  Primary       I have discontinued [name]'s traMADol, ferrous sulfate, atenolol, and baclofen. I am also having her start on cyclobenzaprine and atenolol. Additionally, I am having her maintain her montelukast and fluticasone.  Meds ordered this encounter  Medications  . DISCONTD: traMADol (ULTRAM) 50 MG tablet    Sig: TAKE 1 TABLET BY MOUTH THREE TIMES DAILY AS NEEDED FOR MODERATE PAIN    Dispense:  90 tablet    Refill:  0  . DISCONTD: fluticasone (FLONASE) 50 MCG/ACT nasal spray    Sig: Place 2 sprays into both nostrils daily as needed for allergies or rhinitis.    Dispense:  16 g    Refill:  6  . cyclobenzaprine (FLEXERIL) 10 MG tablet    Sig:  Take 1 tablet (10 mg total) by mouth at bedtime as needed for muscle spasms.    Dispense:  30 tablet    Refill:  2  . fluticasone (FLONASE) 50 MCG/ACT nasal spray    Sig: Place 2 sprays into both nostrils daily as needed for allergies or rhinitis.    Dispense:  16 g    Refill:  6  . atenolol (TENORMIN) 100 MG tablet    Sig: Take 1 tablet (100 mg total) by mouth daily.    Dispense:  30 tablet    Refill:  5     Danise EdgeBLYTH, Dara Camargo, MD

## 2015-03-24 IMAGING — US US OB COMP LESS 14 WK
1 series · 14 of 28 positions shown · non-contrast
Comparison: None.

CLINICAL DATA: RLQ abd pain,right lower quadrant pain.

OBSTETRIC <14 WK US
TECHNIQUE: Transabdominal ultrasound examination was performed for
complete evaluation of the gestation as well as the maternal
uterus, adnexal regions, and pelvic cul-de-sac.

[Series 1: us ob comp less 14 wks · 14 of 44 slices shown]
[im 2/44]
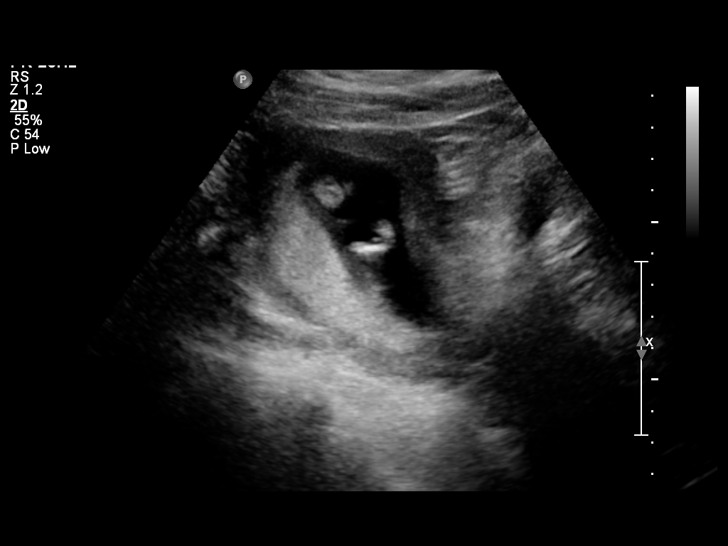
[im 5/44]
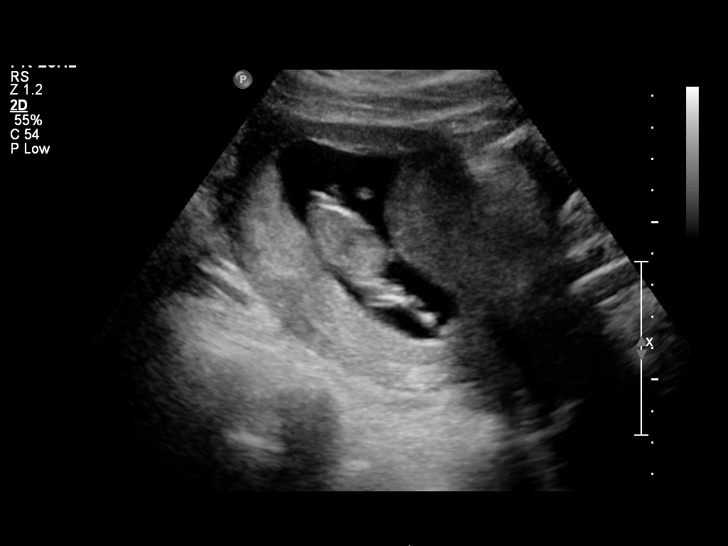
[im 8/44]
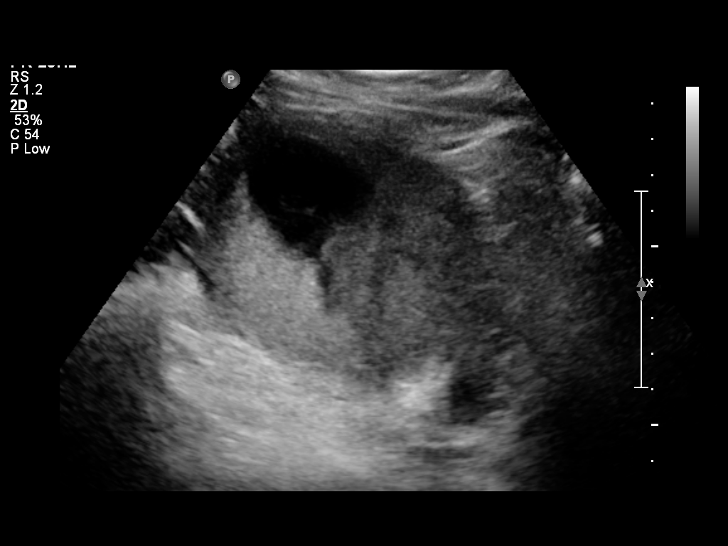
[im 12/44]
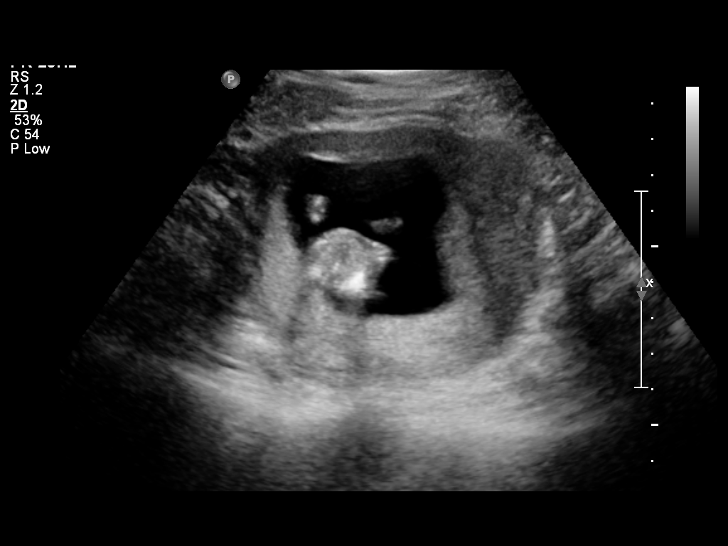
[im 15/44]
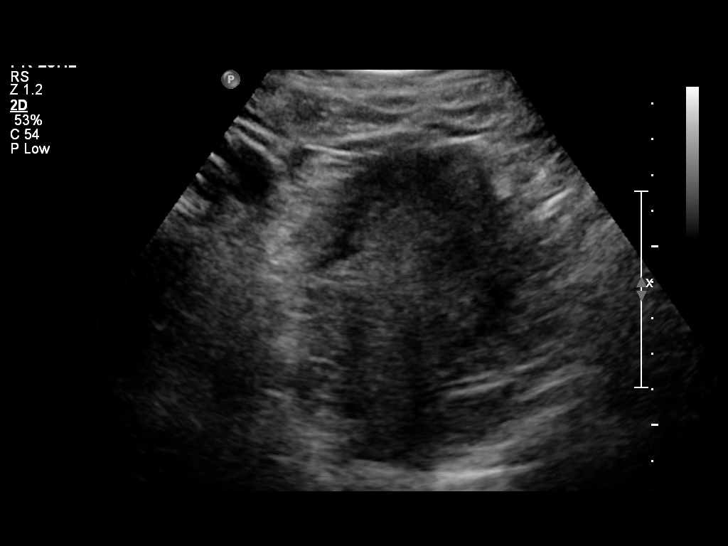
[im 18/44]
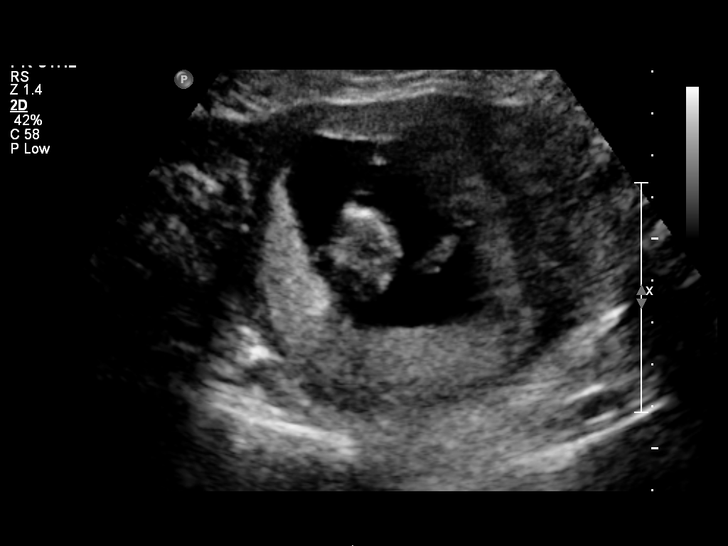
[im 21/44]
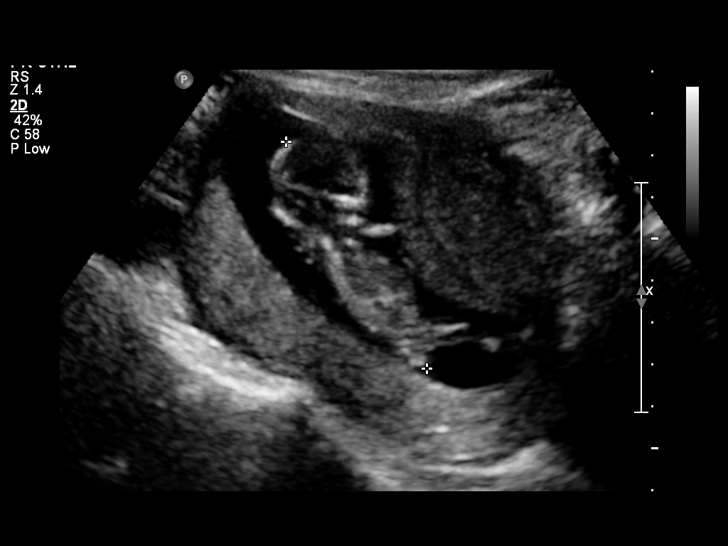
[im 24/44]
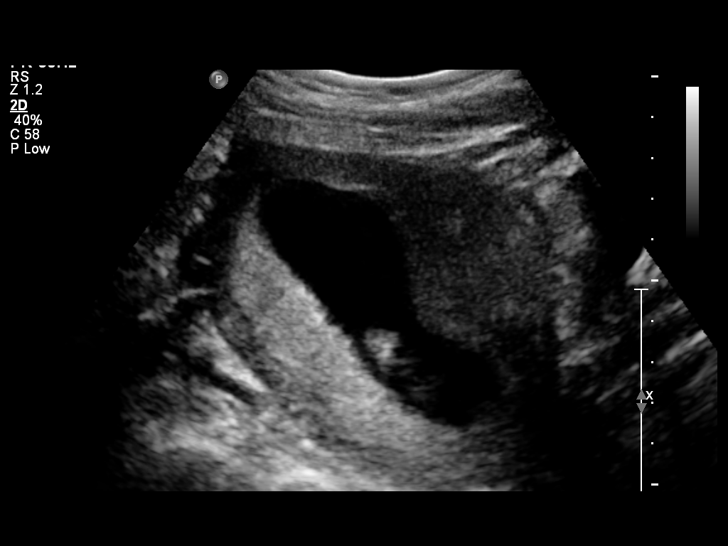
[im 28/44]
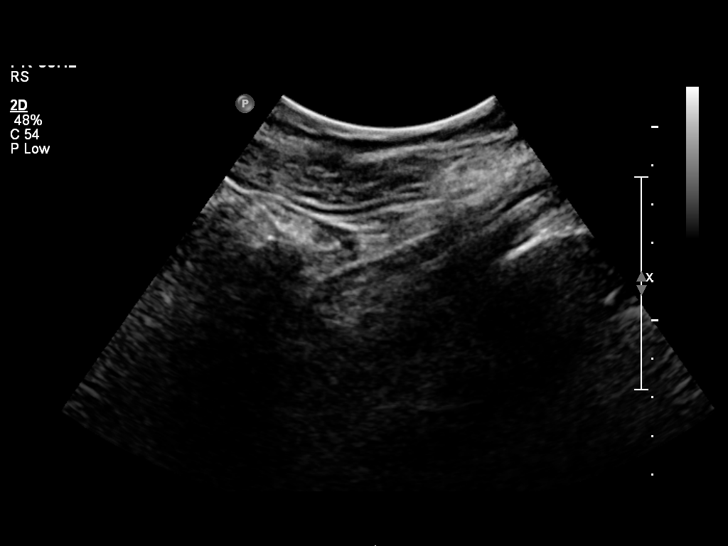
[im 31/44]
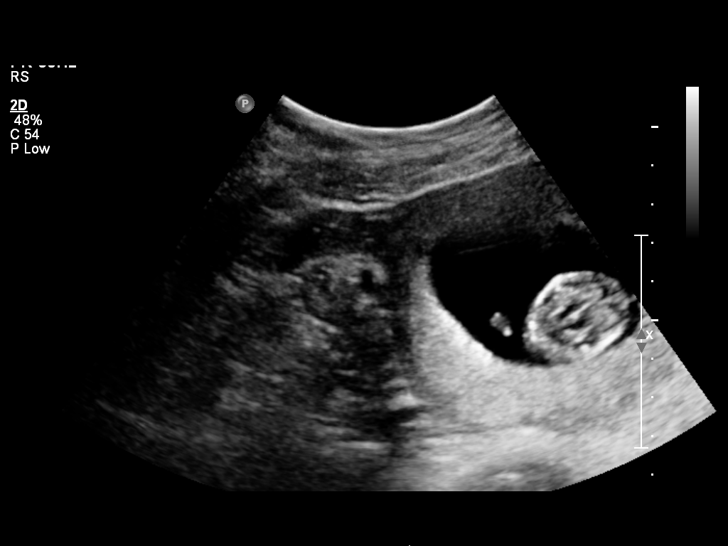
[im 34/44]
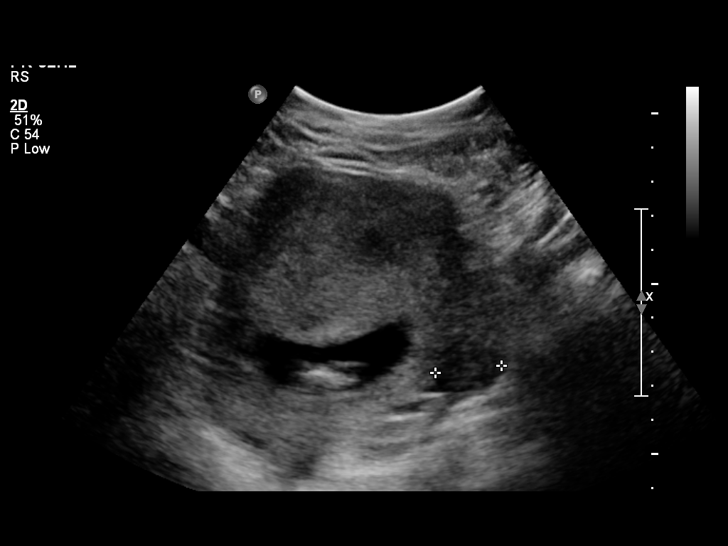
[im 37/44]
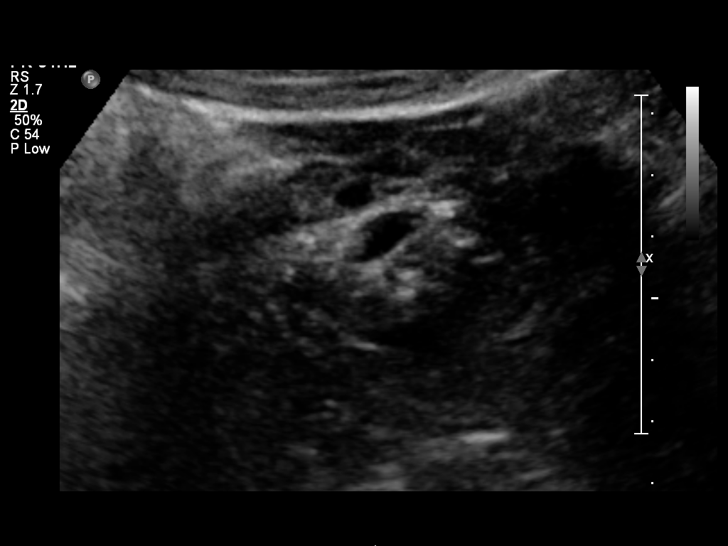
[im 40/44]
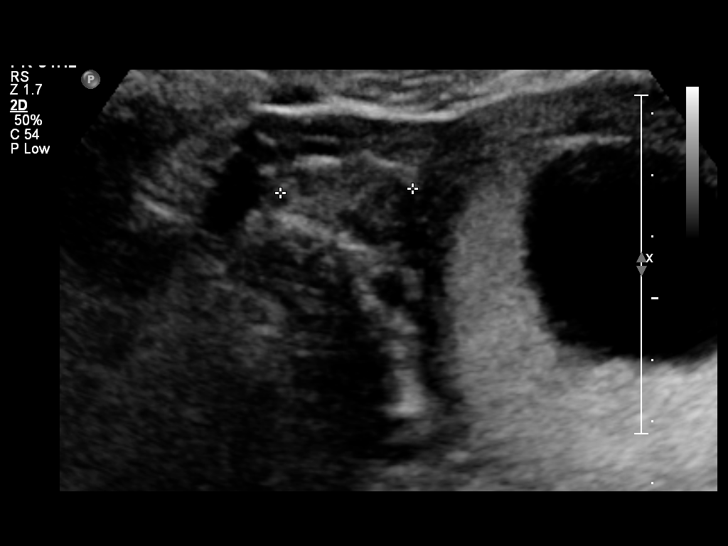
[im 44/44]
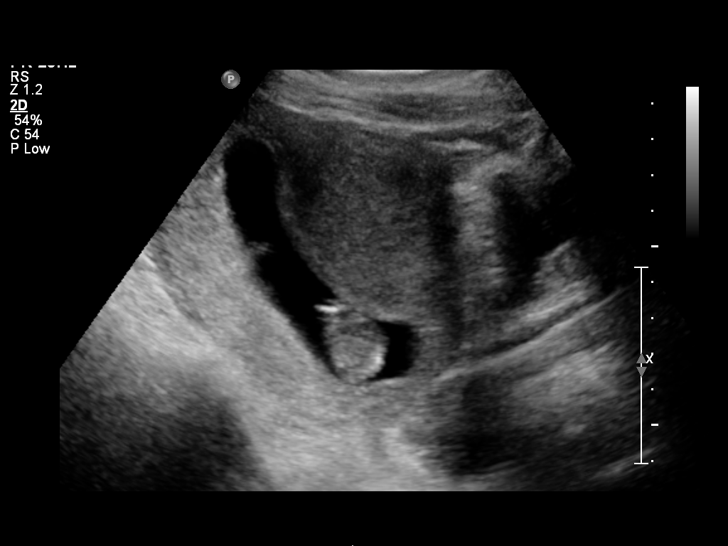

[14 of 28 positions shown; findings below may reference images not displayed]

FINDINGS: There is a single intrauterine gestation.  Based on a
crown-rump length, estimated gestational age is 12 weeks 6 days.
Fetal heart rate 153 beats per minute.  No subchorionic hemorrhage.
Anterior fibroid noted which measures up to 4.6 cm.

Ovaries are symmetric in size and echotexture.  No adnexal masses.
No free fluid.
IMPRESSION: 12-week-2-day intrauterine pregnancy.  Fetal heart rate 153 beats
per minute.

## 2015-06-03 ENCOUNTER — Telehealth: Payer: Self-pay | Admitting: Family Medicine

## 2015-06-03 DIAGNOSIS — M546 Pain in thoracic spine: Secondary | ICD-10-CM

## 2015-06-03 NOTE — Telephone Encounter (Signed)
Caller name:ALICE Relationship to patient: Can be reached:[phone] Pharmacy:  Reason for call:TRAMADOL

## 2015-06-03 NOTE — Telephone Encounter (Signed)
Last refill #90 with 0 refills on 03/14/2015 and last OV 03/11/2015

## 2015-06-03 NOTE — Telephone Encounter (Signed)
OK to refill Tramadol with same sig, same number

## 2015-06-04 MED ORDER — TRAMADOL HCL 50 MG PO TABS: ORAL_TABLET | ORAL | Status: DC

## 2015-06-04 NOTE — Telephone Encounter (Signed)
Faxed hardcopy for Tramadol to [facility] [place]

## 2015-06-04 NOTE — Telephone Encounter (Signed)
Printed and on counter for signature. 

## 2015-07-14 ENCOUNTER — Encounter: Payer: Self-pay | Admitting: Family Medicine

## 2015-07-14 NOTE — Telephone Encounter (Signed)
error:[id number] ° °

## 2015-07-15 ENCOUNTER — Encounter: Payer: Self-pay | Admitting: Family Medicine

## 2015-07-15 ENCOUNTER — Ambulatory Visit (INDEPENDENT_AMBULATORY_CARE_PROVIDER_SITE_OTHER): Payer: Managed Care, Other (non HMO) | Admitting: Family Medicine

## 2015-07-15 VITALS — BP 138/84 | HR 84 | Temp 97.6°F | Ht 64.0 in | Wt 170.2 lb

## 2015-07-15 DIAGNOSIS — I10 Essential (primary) hypertension: Secondary | ICD-10-CM

## 2015-07-15 DIAGNOSIS — R5383 Other fatigue: Secondary | ICD-10-CM

## 2015-07-15 DIAGNOSIS — F341 Dysthymic disorder: Secondary | ICD-10-CM

## 2015-07-15 DIAGNOSIS — F418 Other specified anxiety disorders: Secondary | ICD-10-CM | POA: Diagnosis not present

## 2015-07-15 DIAGNOSIS — D649 Anemia, unspecified: Secondary | ICD-10-CM

## 2015-07-15 DIAGNOSIS — E7800 Pure hypercholesterolemia, unspecified: Secondary | ICD-10-CM

## 2015-07-15 DIAGNOSIS — K5900 Constipation, unspecified: Secondary | ICD-10-CM

## 2015-07-15 MED ORDER — ALPRAZOLAM 0.25 MG PO TABS: 0.2500 mg | ORAL_TABLET | Freq: Two times a day (BID) | ORAL | Status: DC | PRN

## 2015-07-15 MED ORDER — ESCITALOPRAM OXALATE 10 MG PO TABS: 10.0000 mg | ORAL_TABLET | Freq: Every day | ORAL | Status: DC

## 2015-07-15 NOTE — Progress Notes (Signed)
Pre visit review using our clinic review tool, if applicable. No additional management support is needed unless otherwise documented below in the visit note. 

## 2015-07-15 NOTE — Patient Instructions (Signed)
Fatigue  Fatigue is feeling tired all of the time, a lack of energy, or a lack of motivation. Occasional or mild fatigue is often a normal response to activity or life in general. However, long-lasting (chronic) or extreme fatigue may indicate an underlying medical condition.  HOME CARE INSTRUCTIONS   Watch your fatigue for any changes. The following actions may help to lessen any discomfort you are feeling:  · Talk to your health care provider about how much sleep you need each night. Try to get the required amount every night.  · Take medicines only as directed by your health care provider.  · Eat a healthy and nutritious diet. Ask your health care provider if you need help changing your diet.  · Drink enough fluid to keep your urine clear or pale yellow.  · Practice ways of relaxing, such as yoga, meditation, massage therapy, or acupuncture.  · Exercise regularly.    · Change situations that cause you stress. Try to keep your work and personal routine reasonable.  · Do not abuse illegal drugs.  · Limit alcohol intake to no more than 1 drink per day for nonpregnant women and 2 drinks per day for men. One drink equals 12 ounces of beer, 5 ounces of wine, or 1½ ounces of hard liquor.  · Take a multivitamin, if directed by your health care provider.  SEEK MEDICAL CARE IF:   · Your fatigue does not get better.  · You have a fever.    · You have unintentional weight loss or gain.  · You have headaches.    · You have difficulty:      Falling asleep.    Sleeping throughout the night.  · You feel angry, guilty, anxious, or sad.     · You are unable to have a bowel movement (constipation).    · You skin is dry.     · Your legs or another part of your body is swollen.    SEEK IMMEDIATE MEDICAL CARE IF:   · You feel confused.    · Your vision is blurry.  · You feel faint or pass out.    · You have a severe headache.    · You have severe abdominal, pelvic, or back pain.    · You have chest pain, shortness of breath, or an  irregular or fast heartbeat.    · You are unable to urinate or you urinate less than normal.    · You develop abnormal bleeding, such as bleeding from the rectum, vagina, nose, lungs, or nipples.  · You vomit blood.     · You have thoughts about harming yourself or committing suicide.    · You are worried that you might harm someone else.       This information is not intended to replace advice given to you by your health care provider. Make sure you discuss any questions you have with your health care provider.     Document Released: 02/21/2007 Document Revised: 05/17/2014 Document Reviewed: 08/28/2013  Elsevier Interactive Patient Education ©2016 [employer] Inc[employer]

## 2015-07-17 ENCOUNTER — Other Ambulatory Visit (INDEPENDENT_AMBULATORY_CARE_PROVIDER_SITE_OTHER): Payer: Managed Care, Other (non HMO)

## 2015-07-17 DIAGNOSIS — D649 Anemia, unspecified: Secondary | ICD-10-CM | POA: Diagnosis not present

## 2015-07-17 DIAGNOSIS — F418 Other specified anxiety disorders: Secondary | ICD-10-CM | POA: Diagnosis not present

## 2015-07-17 DIAGNOSIS — R5383 Other fatigue: Secondary | ICD-10-CM

## 2015-07-17 LAB — CBC
HEMATOCRIT: 36.1 % (ref 36.0–46.0)
HEMOGLOBIN: 12 g/dL (ref 12.0–15.0)
MCHC: 33.3 g/dL (ref 30.0–36.0)
MCV: 84.1 fl (ref 78.0–100.0)
Platelets: 248 10*3/uL (ref 150.0–400.0)
RBC: 4.3 Mil/uL (ref 3.87–5.11)
RDW: 12.3 % (ref 11.5–15.5)
WBC: 9.1 10*3/uL (ref 4.0–10.5)

## 2015-07-17 LAB — SEDIMENTATION RATE: SED RATE: 25 mm/h — AB (ref 0–22)

## 2015-07-17 LAB — TSH: TSH: 0.54 u[IU]/mL (ref 0.35–4.50)

## 2015-07-17 LAB — FERRITIN: Ferritin: 39.1 ng/mL (ref 10.0–291.0)

## 2015-07-27 NOTE — Assessment & Plan Note (Signed)
Well controlled, no changes to meds. Encouraged heart healthy diet such as the DASH diet and exercise as tolerated.  °

## 2015-07-27 NOTE — Assessment & Plan Note (Addendum)
Struggling start Lexapro daily and use Alprazolam prn, consider counseling as well if no response.

## 2015-07-27 NOTE — Progress Notes (Signed)
Patient ID: Megan Hunt, female   DOB: 04-19-1986, 30 y.o.   MRN: [id number]   Subjective:    Patient ID: Megan Hunt, female    DOB: [birth year], 30 y.o.   MRN: [id number]  Chief Complaint  Patient presents with  . Follow-up    HPI Patient is in today for follow up. She initially c/o fatigue. She then acknowledges hi levels of stress and anxiety. She is struggling to concentrate and manage her daily activities, she endoreses anhedonia but deneis suicidal ideation. Denies CP/palp/SOB/HA/congestion/fevers/GI or GU c/o. Taking meds as prescribed  Past Medical History  Diagnosis Date  . Allergic rhinitis   . Asthma   . Atypical chest pain   . Hypercholesteremia   . Constipation   . Back pain   . Somatic dysfunction   . History of migraines   . Anxiety and depression   . Anxiety   . Hypertension     no longer taking meds  . Allergy   . Anemia   . Asthma, mild intermittent 10/27/2008    Qualifier: Diagnosis of  By: Lenna Gilford MD, Deborra Medina     Past Surgical History  Procedure Laterality Date  . Wisdom tooth extraction      Family History  Problem Relation Age of Onset  . Hypertension Father   . Arthritis Brother   . Stroke Maternal Grandfather   . Hypertension Maternal Grandfather     Social History   Social History  . Marital Status: Married    Spouse Name: N/A  . Number of Children: 2  . Years of Education: N/A   Occupational History  . 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      grad [employer] degree in social work   Social History Main Topics  . Smoking status: Never Smoker   . Smokeless tobacco: Never Used  . Alcohol Use: No  . Drug Use: No  . Sexual Activity: Yes    Birth Control/ Protection: None     Comment: lives with husband and 3 small children, no dietary restrictions, stays at home   Other Topics Concern  . Not on file   Social History Narrative   Son [place] born 02/2008 and daughter Eulogio Ditch born 06/2009   Vonna Kotyk born 03/22/2013     Outpatient Prescriptions Prior to Visit  Medication Sig Dispense Refill  . atenolol (TENORMIN) 100 MG tablet Take 1 tablet (100 mg total) by mouth daily. 30 tablet 5  . fluticasone (FLONASE) 50 MCG/ACT nasal spray Place 2 sprays into both nostrils daily as needed for allergies or rhinitis. 16 g 6  . montelukast (SINGULAIR) 10 MG tablet Take 1 tablet (10 mg total) by mouth at bedtime. 30 tablet 3  . traMADol (ULTRAM) 50 MG tablet TAKE 1 TABLET BY MOUTH THREE TIMES DAILY AS NEEDED FOR MODERATE PAIN 90 tablet 0  . cyclobenzaprine (FLEXERIL) 10 MG tablet Take 1 tablet (10 mg total) by mouth at bedtime as needed for muscle spasms. 30 tablet 2   No facility-administered medications prior to visit.    No Known Allergies  Review of Systems  Constitutional: Positive for malaise/fatigue. Negative for fever.  HENT: Negative for congestion.   Eyes: Negative for blurred vision.  Respiratory: Negative for shortness of breath.   Cardiovascular: Negative for chest pain, palpitations and leg swelling.  Gastrointestinal: Negative for nausea, abdominal pain and blood in stool.  Genitourinary: Negative for dysuria and frequency.  Musculoskeletal: Positive for myalgias. Negative for  falls.  Skin: Negative for rash.  Neurological: Negative for dizziness, loss of consciousness and headaches.  Endo/Heme/Allergies: Negative for environmental allergies.  Psychiatric/Behavioral: Positive for depression. The patient is nervous/anxious.        Objective:    Physical Exam  Constitutional: She is oriented to person, place, and time. She appears well-developed and well-nourished. No distress.  HENT:  Head: Normocephalic and atraumatic.  Nose: Nose normal.  Eyes: Right eye exhibits no discharge. Left eye exhibits no discharge.  Neck: Normal range of motion. Neck supple.  Cardiovascular: Normal rate and regular rhythm.   No murmur heard. Pulmonary/Chest: Effort normal and breath sounds normal.   Abdominal: Soft. Bowel sounds are normal. There is no tenderness.  Musculoskeletal: She exhibits no edema.  Neurological: She is alert and oriented to person, place, and time.  Skin: Skin is warm and dry.  Psychiatric: She has a normal mood and affect.  Nursing note and vitals reviewed.   BP 138/84 mmHg  Pulse 84  Temp(Src) 97.6 F (36.4 C) (Oral)  Ht '5\' 4"'  (1.626 m)  Wt 170 lb 4 oz (77.225 kg)  BMI 29.21 kg/m2  SpO2 98% Wt Readings from Last 3 Encounters:  07/15/15 170 lb 4 oz (77.225 kg)  03/11/15 164 lb 6 oz (74.56 kg)  10/25/14 157 lb 6 oz (71.385 kg)     Lab Results  Component Value Date   WBC 9.1 07/17/2015   HGB 12.0 07/17/2015   HCT 36.1 07/17/2015   PLT 248.0 07/17/2015   GLUCOSE 78 10/25/2014   CHOL 199 10/25/2014   TRIG 88.0 10/25/2014   HDL 62.50 10/25/2014   LDLDIRECT 177.4 06/15/2013   LDLCALC 119* 10/25/2014   ALT 10 10/25/2014   AST 17 10/25/2014   NA 135 10/25/2014   K 3.8 10/25/2014   CL 102 10/25/2014   CREATININE 0.74 10/25/2014   BUN 8 10/25/2014   CO2 28 10/25/2014   TSH 0.54 07/17/2015    Lab Results  Component Value Date   TSH 0.54 07/17/2015   Lab Results  Component Value Date   WBC 9.1 07/17/2015   HGB 12.0 07/17/2015   HCT 36.1 07/17/2015   MCV 84.1 07/17/2015   PLT 248.0 07/17/2015   Lab Results  Component Value Date   NA 135 10/25/2014   K 3.8 10/25/2014   CO2 28 10/25/2014   GLUCOSE 78 10/25/2014   BUN 8 10/25/2014   CREATININE 0.74 10/25/2014   BILITOT 0.5 10/25/2014   ALKPHOS 98 10/25/2014   AST 17 10/25/2014   ALT 10 10/25/2014   PROT 7.0 10/25/2014   ALBUMIN 4.4 10/25/2014   CALCIUM 9.3 10/25/2014   GFR 118.86 10/25/2014   Lab Results  Component Value Date   CHOL 199 10/25/2014   Lab Results  Component Value Date   HDL 62.50 10/25/2014   Lab Results  Component Value Date   LDLCALC 119* 10/25/2014   Lab Results  Component Value Date   TRIG 88.0 10/25/2014   Lab Results  Component Value Date    CHOLHDL 3 10/25/2014   No results found for: HGBA1C     Assessment & Plan:   Problem List Items Addressed This Visit    ANXIETY DEPRESSION    Struggling start Lexapro daily and use Alprazolam prn, consider counseling as well if no response.       Relevant Medications   escitalopram (LEXAPRO) 10 MG tablet   ALPRAZolam (XANAX) 0.25 MG tablet   Constipation    Encouraged increased  hydration and fiber in diet. Daily probiotics. If bowels not moving can use MOM 2 tbls po in 4 oz of warm prune juice by mouth every 2-3 days.      Essential hypertension    Well controlled, no changes to meds. Encouraged heart healthy diet such as the DASH diet and exercise as tolerated.       Fatigue    Labs unremarkable, discussed need for stress reduciton, 8 hours of sleep, regular exercise and healthy diet      Relevant Orders   TSH (Completed)   CBC (Completed)   Sed Rate (ESR) (Completed)   Ferritin (Completed)   HYPERCHOLESTEROLEMIA    Encouraged heart healthy diet, increase exercise, avoid trans fats, consider a krill oil cap daily       Other Visit Diagnoses    Depression with anxiety    -  Primary    Relevant Orders    TSH (Completed)    CBC (Completed)    Sed Rate (ESR) (Completed)    Ferritin (Completed)    Anemia, unspecified anemia type        Relevant Orders    TSH (Completed)    CBC (Completed)    Sed Rate (ESR) (Completed)    Ferritin (Completed)       I have discontinued Ms. Glassberg's cyclobenzaprine. I am also having her start on escitalopram and ALPRAZolam. Additionally, I am having her maintain her montelukast, fluticasone, atenolol, and traMADol.  Meds ordered this encounter  Medications  . escitalopram (LEXAPRO) 10 MG tablet    Sig: Take 1 tablet (10 mg total) by mouth daily.    Dispense:  30 tablet    Refill:  3  . ALPRAZolam (XANAX) 0.25 MG tablet    Sig: Take 1 tablet (0.25 mg total) by mouth 2 (two) times daily as needed for anxiety.    Dispense:  20  tablet    Refill:  1     Penni Homans, MD

## 2015-07-27 NOTE — Assessment & Plan Note (Signed)
Encouraged heart healthy diet, increase exercise, avoid trans fats, consider a krill oil cap daily 

## 2015-07-27 NOTE — Assessment & Plan Note (Signed)
Encouraged increased hydration and fiber in diet. Daily probiotics. If bowels not moving can use MOM 2 tbls po in 4 oz of warm prune juice by mouth every 2-3 days.  

## 2015-07-27 NOTE — Assessment & Plan Note (Signed)
Labs unremarkable, discussed need for stress reduciton, 8 hours of sleep, regular exercise and healthy diet

## 2015-08-28 LAB — PROCEDURE REPORT - SCANNED: Pap: NEGATIVE

## 2015-09-23 ENCOUNTER — Ambulatory Visit: Payer: Managed Care, Other (non HMO) | Admitting: Family Medicine

## 2015-11-28 ENCOUNTER — Other Ambulatory Visit: Payer: Self-pay | Admitting: Family Medicine

## 2015-11-28 DIAGNOSIS — M546 Pain in thoracic spine: Secondary | ICD-10-CM

## 2015-11-28 MED ORDER — TRAMADOL HCL 50 MG PO TABS: ORAL_TABLET | ORAL | Status: DC

## 2015-11-28 NOTE — Telephone Encounter (Signed)
°  Relationship to patient: Self  Can be reached: [phone]   Reason for call: Request refill on traMADol (ULTRAM) 50 MG tablet [[id number]][[id number]]

## 2015-11-28 NOTE — Telephone Encounter (Signed)
Last office visit 3.7.2017 Last refill #90 with 0 refills on 06/04/2015

## 2015-11-28 NOTE — Telephone Encounter (Signed)
[facility] [place] [facility] Monday 12/01/2015 once PCP in the office to sign script.

## 2016-02-03 ENCOUNTER — Encounter: Payer: Self-pay | Admitting: *Deleted

## 2016-02-09 ENCOUNTER — Other Ambulatory Visit: Payer: Self-pay | Admitting: Family Medicine

## 2016-02-09 NOTE — Telephone Encounter (Signed)
Has not been seen for 7 months cannot have refill if not seen in past 6 months

## 2016-02-09 NOTE — Telephone Encounter (Signed)
Patient has been notified about refusal. Pt has appointment scheduled 03/18/16 PC

## 2016-02-09 NOTE — Telephone Encounter (Signed)
Requesting:  Alprazolam Contract  None UDS  None Last OV   07/15/2015 Last Refill   #20 with 1 refill 07/15/2015  Please Advise

## 2016-03-12 ENCOUNTER — Other Ambulatory Visit: Payer: Self-pay | Admitting: Family Medicine

## 2016-03-12 MED ORDER — ESCITALOPRAM OXALATE 10 MG PO TABS: 10.0000 mg | ORAL_TABLET | Freq: Every day | ORAL | 0 refills | Status: DC

## 2016-03-18 ENCOUNTER — Ambulatory Visit (INDEPENDENT_AMBULATORY_CARE_PROVIDER_SITE_OTHER): Payer: Managed Care, Other (non HMO) | Admitting: Family Medicine

## 2016-03-18 DIAGNOSIS — F341 Dysthymic disorder: Secondary | ICD-10-CM

## 2016-03-18 DIAGNOSIS — Z23 Encounter for immunization: Secondary | ICD-10-CM

## 2016-03-18 DIAGNOSIS — I10 Essential (primary) hypertension: Secondary | ICD-10-CM | POA: Diagnosis not present

## 2016-03-18 DIAGNOSIS — E7800 Pure hypercholesterolemia, unspecified: Secondary | ICD-10-CM

## 2016-03-18 MED ORDER — TRAMADOL HCL 50 MG PO TABS: ORAL_TABLET | ORAL | 0 refills | Status: DC

## 2016-03-18 MED ORDER — MONTELUKAST SODIUM 10 MG PO TABS: 10.0000 mg | ORAL_TABLET | Freq: Every day | ORAL | 3 refills | Status: DC

## 2016-03-18 MED ORDER — QUETIAPINE FUMARATE 100 MG PO TABS: ORAL_TABLET | ORAL | 1 refills | Status: DC

## 2016-03-18 MED ORDER — ALPRAZOLAM 0.25 MG PO TABS: 0.2500 mg | ORAL_TABLET | Freq: Two times a day (BID) | ORAL | 2 refills | Status: DC | PRN

## 2016-03-18 NOTE — Patient Instructions (Addendum)
Seroquel  Complicated Grieving Grief is a normal response to the death of someone close to you. Feelings of fear, anger, and guilt can affect almost everyone who loses a loved one. It is also common to have symptoms of depression while you are grieving. These include problems with sleep, loss of appetite, and lack of energy. They may last for weeks or months after a loss. Complicated grief is different from normal grief or depression. Normal grieving involves sadness and feelings of loss, but these feelings are not constant. Complicated grief is a constant and severe type of grief. It interferes with your ability to function normally. It may last for several months to a year or longer. Complicated grief may require treatment from a mental health care provider. CAUSES  It is not known why some people continue to struggle with grief and others do not. You may be at higher risk for complicated grief if:  The death of your loved one was sudden or unexpected.  The death of your loved one was due to a violent event.  Your loved one committed suicide.  Your loved one was a child or a young person.  You were very close to or dependent on the loved one.  You have a history of depression. SIGNS AND SYMPTOMS Signs and symptoms of complicated grief may include:  Feeling disbelief or numbness.  Being unable to enjoy good memories of your loved one.  Needing to avoid anything that reminds you of your loved one.  Being unable to stop thinking about the death.  Feeling intense anger or guilt.  Feeling alone and hopeless.  Feeling that your life is meaningless and empty.  Losing the desire to live. DIAGNOSIS Your health care provider may diagnose complicated grief if:  You have constant symptoms of grief for 6-12 months or longer.  Your symptoms are interfering with your ability to live your life. Your health care provider may want you to see a mental health care provider. Many symptoms of  depression are similar to the symptoms of complicated grief. It is important to be evaluated for complicated grief along with other mental health conditions. TREATMENT  Talk therapy with a mental health provider is the most common treatment for complicated grief. During therapy, you will learn healthy ways to cope with the loss of your loved one. In some cases, your mental health care provider may also recommend antidepressant medicines. HOME CARE INSTRUCTIONS  Take care of yourself.  Eat regular meals and maintain a healthy diet. Eat plenty of fruits, vegetables, and whole grains.  Try to get some exercise each day.  Keep regular hours for sleep. Try to get at least 8 hours of sleep each night.  Do not use drugs or alcohol to ease your symptoms.  Take medicines only as directed by your health care provider.  Spend time with friends and loved ones.  Consider joining a grief (bereavement) support group to help you deal with your loss.  Keep all follow-up visits as directed by your health care provider. This is important. SEEK MEDICAL CARE IF:  Your symptoms keep you from functioning normally.  Your symptoms do not get better with treatment. SEEK IMMEDIATE MEDICAL CARE IF:  You have serious thoughts of hurting yourself or someone else.  You have suicidal feelings.   This information is not intended to replace advice given to you by your health care provider. Make sure you discuss any questions you have with your health care provider.   Document  Released: 04/26/2005 Document Revised: 01/15/2015 Document Reviewed: 10/04/2013 Elsevier Interactive Patient Education [employer]2016 Elsevier Inc.

## 2016-03-18 NOTE — Progress Notes (Signed)
Pre visit review using our clinic review tool, if applicable. No additional management support is needed unless otherwise documented below in the visit note. 

## 2016-03-18 NOTE — Progress Notes (Signed)
Patient ID: Megan NickAllice M Hunt, female   DOB: 1986/01/30, 30 y.o.   MRN: [id number]   Subjective:    Patient ID: Megan Hunt, female    DOB: 1986/01/30, 30 y.o.   MRN: [id number]  Chief Complaint  Patient presents with  . Follow-up    HPI Patient is in today for a follow up pt c/o of being stressed after a family member passing away. She notes being very stressed since her family member passed but she acknowledges being very agitated, anxious, depressed since prior to this episode. She has been very short tempered with her family. She has trouble sleeping, quieting her thoughts and settling down. Denies CP/palp/SOB/HA/congestion/fevers/GI or GU c/o. Taking meds as prescribed  Past Medical History:  Diagnosis Date  . Allergic rhinitis   . Allergy   . Anemia   . Anxiety   . Anxiety and depression   . Asthma   . Asthma, mild intermittent 10/27/2008   Qualifier: Diagnosis of  By: Kriste BasqueNadel MD, Lonzo CloudScott M   . Atypical chest pain   . Back pain   . Constipation   . History of migraines   . Hypercholesteremia   . Hypertension    no longer taking meds  . Somatic dysfunction     Past Surgical History:  Procedure Laterality Date  . WISDOM TOOTH EXTRACTION      Family History  Problem Relation Age of Onset  . Hypertension Father   . Arthritis Brother   . Stroke Maternal Grandfather   . Hypertension Maternal Grandfather     Social History   Social History  . Marital status: Married    Spouse name: N/A  . Number of children: 2  . Years of education: N/A   Occupational History  . 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      grad [employer] degree in social work   Social History Main Topics  . Smoking status: Never Smoker  . Smokeless tobacco: Never Used  . Alcohol use No  . Drug use: No  . Sexual activity: Yes    Birth control/ protection: None     Comment: lives with husband and 3 small children, no dietary restrictions, stays at home   Other Topics Concern  . Not  on file   Social History Narrative   Son [place]jordan born 02/2008 and daughter Penne Lashjamaya born 06/2009   Ivin BootyJoshua born 03/22/2013    Outpatient Medications Prior to Visit  Medication Sig Dispense Refill  . atenolol (TENORMIN) 100 MG tablet Take 1 tablet (100 mg total) by mouth daily. (Patient not taking: Reported on 03/18/2016) 30 tablet 5  . escitalopram (LEXAPRO) 10 MG tablet Take 1 tablet (10 mg total) by mouth daily. 90 tablet 0  . fluticasone (FLONASE) 50 MCG/ACT nasal spray Place 2 sprays into both nostrils daily as needed for allergies or rhinitis. 16 g 6  . ALPRAZolam (XANAX) 0.25 MG tablet Take 1 tablet (0.25 mg total) by mouth 2 (two) times daily as needed for anxiety. 20 tablet 1  . montelukast (SINGULAIR) 10 MG tablet Take 1 tablet (10 mg total) by mouth at bedtime. 30 tablet 3  . traMADol (ULTRAM) 50 MG tablet TAKE 1 TABLET BY MOUTH THREE TIMES DAILY AS NEEDED FOR MODERATE PAIN 90 tablet 0   No facility-administered medications prior to visit.     No Known Allergies  Review of Systems  Constitutional: Negative for fever.  Eyes: Negative for blurred vision.  Respiratory: Negative for  cough and shortness of breath.   Cardiovascular: Negative for chest pain and palpitations.  Gastrointestinal: Negative for vomiting.  Musculoskeletal: Negative for back pain.  Skin: Negative for rash.  Neurological: Negative for loss of consciousness and headaches.       Objective:    Physical Exam  Constitutional: She is oriented to person, place, and time. She appears well-developed and well-nourished. No distress.  HENT:  Head: Normocephalic and atraumatic.  Eyes: Conjunctivae are normal.  Neck: Normal range of motion. No thyromegaly present.  Cardiovascular: Normal rate and regular rhythm.   Pulmonary/Chest: Effort normal and breath sounds normal. She has no wheezes.  Abdominal: Soft. Bowel sounds are normal. There is no tenderness.  Musculoskeletal: Normal range of motion. She exhibits no  edema or deformity.  Neurological: She is alert and oriented to person, place, and time.  Skin: Skin is warm and dry. She is not diaphoretic.  Psychiatric: She has a normal mood and affect.    BP 126/82 (BP Location: Left Arm, Patient Position: Sitting, Cuff Size: Normal)   Pulse 73   Temp 98.3 F (36.8 C)   Wt 176 lb 3.2 oz (79.9 kg)   SpO2 99%   BMI 30.24 kg/m  Wt Readings from Last 3 Encounters:  03/18/16 176 lb 3.2 oz (79.9 kg)  07/15/15 170 lb 4 oz (77.2 kg)  03/11/15 164 lb 6 oz (74.6 kg)     Lab Results  Component Value Date   WBC 9.1 07/17/2015   HGB 12.0 07/17/2015   HCT 36.1 07/17/2015   PLT 248.0 07/17/2015   GLUCOSE 78 10/25/2014   CHOL 199 10/25/2014   TRIG 88.0 10/25/2014   HDL 62.50 10/25/2014   LDLDIRECT 177.4 06/15/2013   LDLCALC 119 (H) 10/25/2014   ALT 10 10/25/2014   AST 17 10/25/2014   NA 135 10/25/2014   K 3.8 10/25/2014   CL 102 10/25/2014   CREATININE 0.74 10/25/2014   BUN 8 10/25/2014   CO2 28 10/25/2014   TSH 0.54 07/17/2015    Lab Results  Component Value Date   TSH 0.54 07/17/2015   Lab Results  Component Value Date   WBC 9.1 07/17/2015   HGB 12.0 07/17/2015   HCT 36.1 07/17/2015   MCV 84.1 07/17/2015   PLT 248.0 07/17/2015   Lab Results  Component Value Date   NA 135 10/25/2014   K 3.8 10/25/2014   CO2 28 10/25/2014   GLUCOSE 78 10/25/2014   BUN 8 10/25/2014   CREATININE 0.74 10/25/2014   BILITOT 0.5 10/25/2014   ALKPHOS 98 10/25/2014   AST 17 10/25/2014   ALT 10 10/25/2014   PROT 7.0 10/25/2014   ALBUMIN 4.4 10/25/2014   CALCIUM 9.3 10/25/2014   GFR 118.86 10/25/2014   Lab Results  Component Value Date   CHOL 199 10/25/2014   Lab Results  Component Value Date   HDL 62.50 10/25/2014   Lab Results  Component Value Date   LDLCALC 119 (H) 10/25/2014   Lab Results  Component Value Date   TRIG 88.0 10/25/2014   Lab Results  Component Value Date   CHOLHDL 3 10/25/2014   No results found for: HGBA1C      Assessment & Plan:   Problem List Items Addressed This Visit    HYPERCHOLESTEROLEMIA    Encouraged heart healthy diet, increase exercise, avoid trans fats, consider a krill oil cap daily      ANXIETY DEPRESSION    Very stressed lately. Spent greater than 30 minutes  of a 35 minute visit spent in counseling and testing. Her bipolar screen is positive and it is noted in several family members. She is started on Seroquel and she will notify us if any concerns arise.       Relevant Medications   ALPRAZolam (XANAX) 0.25 MG tablet   Essential hypertension    Well controlled, no changes to meds. Encouraged heart healthy diet such as the DASH diet and exercise as tolerated.          I am having Ms. Kearl maintain her fluticasone, atenolol, escitalopram, ALPRAZolam, montelukast, and traMADol.  Meds ordered this encounter  Medications  . ALPRAZolam (XANAX) 0.25 MG tablet    Sig: Take 1 tablet (0.25 mg total) by mouth 2 (two) times daily as needed for anxiety.    Dispense:  30 tablet    Refill:  2  . montelukast (SINGULAIR) 10 MG tablet    Sig: Take 1 tablet (10 mg total) by mouth at bedtime.    Dispense:  30 tablet    Refill:  3  . traMADol (ULTRAM) 50 MG tablet    Sig: TAKE 1 TABLET BY MOUTH THREE TIMES DAILY AS NEEDED FOR MODERATE PAIN    Dispense:  90 tablet    Refill:  0  . DISCONTD: QUEtiapine (SEROQUEL) 100 MG tablet    Sig: 1/2 tablet po qhs x 1 day then 1 tablet po qhs x 1 day 2 tablets po qhs x 1 day then 3 tablets po qhs daily    Dispense:  90 tablet    Refill:  1     BLYTH, STACEY, MD

## 2016-03-18 NOTE — Assessment & Plan Note (Signed)
Well controlled, no changes to meds. Encouraged heart healthy diet such as the DASH diet and exercise as tolerated.  °

## 2016-03-19 ENCOUNTER — Telehealth: Payer: Self-pay | Admitting: Family Medicine

## 2016-03-19 MED ORDER — QUETIAPINE FUMARATE 100 MG PO TABS: ORAL_TABLET | ORAL | 1 refills | Status: DC

## 2016-03-19 NOTE — Telephone Encounter (Signed)
Medication for [facility] patient made aware.  Pc

## 2016-03-19 NOTE — Telephone Encounter (Signed)
°  Relation to ZO:XWRUpt:self Call back number:[phone]   Reason for call:     Patient requesting QUEtiapine (SEROQUEL) 100 MG tablet please send to  [id number] [place],  - [address] [phone] (Phone) [phone] (Fax)

## 2016-03-19 NOTE — Telephone Encounter (Signed)
Please transfer her Seroquel to the pharmacy requested

## 2016-03-21 NOTE — Assessment & Plan Note (Signed)
Very stressed lately. Spent greater than 30 minutes of a 35 minute visit spent in counseling and testing. Her bipolar screen is positive and it is noted in several family members. She is started on Seroquel and she will notify us if any concerns arise.

## 2016-03-21 NOTE — Assessment & Plan Note (Signed)
Encouraged heart healthy diet, increase exercise, avoid trans fats, consider a krill oil cap daily 

## 2016-04-09 ENCOUNTER — Other Ambulatory Visit: Payer: Self-pay | Admitting: Family Medicine

## 2016-04-09 MED ORDER — MONTELUKAST SODIUM 10 MG PO TABS: 10.0000 mg | ORAL_TABLET | Freq: Every day | ORAL | 1 refills | Status: DC

## 2016-05-13 ENCOUNTER — Encounter: Payer: Self-pay | Admitting: Family Medicine

## 2016-05-13 ENCOUNTER — Ambulatory Visit (INDEPENDENT_AMBULATORY_CARE_PROVIDER_SITE_OTHER): Payer: Managed Care, Other (non HMO) | Admitting: Family Medicine

## 2016-05-13 DIAGNOSIS — E7800 Pure hypercholesterolemia, unspecified: Secondary | ICD-10-CM | POA: Diagnosis not present

## 2016-05-13 DIAGNOSIS — I10 Essential (primary) hypertension: Secondary | ICD-10-CM

## 2016-05-13 DIAGNOSIS — G4700 Insomnia, unspecified: Secondary | ICD-10-CM | POA: Diagnosis not present

## 2016-05-13 NOTE — Assessment & Plan Note (Signed)
Well controlled, no changes to meds. Encouraged heart healthy diet such as the DASH diet and exercise as tolerated.  °

## 2016-05-13 NOTE — Progress Notes (Signed)
Pre visit review using our clinic review tool, if applicable. No additional management support is needed unless otherwise documented below in the visit note. 

## 2016-05-13 NOTE — Patient Instructions (Addendum)
Discussed the benefit of taking Melatonin, exercising earlier in the day to aid with sleep. Make sure that you do not go to be hungry, yet do not eat a heavy meal (eating peanut butter crackers or a boiled egg). Gave Patient information on the DASH Diet. Discussed the benefit and responsibilities of the Controlled Substance Contract. Cholesterol Cholesterol is a white, waxy, fat-like substance that is needed by the human body in small amounts. The liver makes all the cholesterol we need. Cholesterol is carried from the liver by the blood through the blood vessels. Deposits of cholesterol (plaques) may build up on blood vessel (artery) walls. Plaques make the arteries narrower and stiffer. Cholesterol plaques increase the risk for heart attack and stroke. You cannot feel your cholesterol level even if it is very high. The only way to know that it is high is to have a blood test. Once you know your cholesterol levels, you should keep a record of the test results. Work with your health care provider to keep your levels in the desired range. What do the results mean?  Total cholesterol is a rough measure of all the cholesterol in your blood.  LDL (low-density lipoprotein) is the "bad" cholesterol. This is the type that causes plaque to build up on the artery walls. You want this level to be low.  HDL (high-density lipoprotein) is the "good" cholesterol because it cleans the arteries and carries the LDL away. You want this level to be high.  Triglycerides are fat that the body can either burn for energy or store. High levels are closely linked to heart disease. What are the desired levels of cholesterol?  Total cholesterol below 200.  LDL below 100 for people who are at risk, below 70 for people at very high risk.  HDL above 40 is good. A level of 60 or higher is considered to be protective against heart disease.  Triglycerides below 150. How can I lower my cholesterol? Diet  Follow your diet  program as told by your health care provider.  Choose fish or white meat chicken and [place], roasted or baked. Limit fatty cuts of red meat, fried foods, and processed meats, such as sausage and lunch meats.  Eat lots of fresh fruits and vegetables.  Choose whole grains, beans, pasta, potatoes, and cereals.  Choose olive oil, corn oil, or canola oil, and use only small amounts.  Avoid butter, mayonnaise, shortening, or palm kernel oils.  Avoid foods with trans fats.  Drink skim or nonfat milk and eat low-fat or nonfat yogurt and cheeses. Avoid whole milk, cream, ice cream, egg yolks, and full-fat cheeses.  Healthier desserts include angel food cake, ginger snaps, animal crackers, hard candy, popsicles, and low-fat or nonfat frozen yogurt. Avoid pastries, cakes, pies, and cookies. Exercise  Follow your exercise program as told by your health care provider. A regular program:  Helps to decrease LDL and raise HDL.  Helps with weight control.  Do things that increase your activity level, such as gardening, walking, and taking the stairs.  Ask your health care provider about ways that you can be more active in your daily life. Medicine  Take over-the-counter and prescription medicines only as told by your health care provider.  Medicine may be prescribed by your health care provider to help lower cholesterol and decrease the risk for heart disease. This is usually done if diet and exercise have failed to bring down cholesterol levels.  If you have several risk factors, you may need  medicine even if your levels are normal. This information is not intended to replace advice given to you by your health care provider. Make sure you discuss any questions you have with your health care provider. Document Released: 01/19/2001 Document Revised: 11/22/2015 Document Reviewed: 10/25/2015 Elsevier Interactive Patient Education  2017 [employer]

## 2016-05-15 ENCOUNTER — Encounter: Payer: Self-pay | Admitting: Family Medicine

## 2016-05-15 DIAGNOSIS — G4700 Insomnia, unspecified: Secondary | ICD-10-CM

## 2016-05-15 HISTORY — DX: Insomnia, unspecified: G47.00

## 2016-05-15 NOTE — Assessment & Plan Note (Signed)
Encouraged heart healthy diet, increase exercise, avoid trans fats, consider a krill oil cap daily 

## 2016-05-15 NOTE — Assessment & Plan Note (Signed)
Encouraged good sleep hygiene such as dark, quiet room. No blue/Sancho glowing lights such as computer screens in bedroom. No alcohol or stimulants in evening. Cut down on caffeine as able. Regular exercise is helpful but not just prior to bed time.  

## 2016-05-15 NOTE — Progress Notes (Signed)
Patient ID: Megan Hunt, female   DOB: 05-25-1985, 31 y.o.   MRN: [id number]   Subjective:    Patient ID: Megan Hunt, female    DOB: [birth year], 31 y.o.   MRN: [id number]  Chief Complaint  Patient presents with  . Follow-up    HTN. Patient states that she doesn't need the Atenolol Rx.    HPI Patient is in today for follow up. She feels well today but she does endorse some ongoing difficulty with maintaining sleep and fatigue, has not tried any medications. . No recent increased stressors or acute illness. Denies CP/palp/SOB/HA/congestion/fevers/GI or GU c/o. Taking meds as prescribed.   Past Medical History:  Diagnosis Date  . Allergic rhinitis   . Allergy   . Anemia   . Anxiety   . Anxiety and depression   . Asthma   . Asthma, mild intermittent 10/27/2008   Qualifier: Diagnosis of  By: Kriste Basque MD, Lonzo Cloud   . Atypical chest pain   . Back pain   . Constipation   . History of migraines   . Hypercholesteremia   . Hypertension    no longer taking meds  . Insomnia 05/15/2016  . Somatic dysfunction     Past Surgical History:  Procedure Laterality Date  . WISDOM TOOTH EXTRACTION      Family History  Problem Relation Age of Onset  . Hypertension Father   . Arthritis Brother   . Stroke Maternal Grandfather   . Hypertension Maternal Grandfather     Social History   Social History  . Marital status: Married    Spouse name: N/A  . Number of children: 2  . Years of education: N/A   Occupational History  . 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      grad [employer] degree in social work   Social History Main Topics  . Smoking status: Never Smoker  . Smokeless tobacco: Never Used  . Alcohol use No  . Drug use: No  . Sexual activity: Yes    Birth control/ protection: None     Comment: lives with husband and 3 small children, no dietary restrictions, stays at home   Other Topics Concern  . Not on file   Social History Narrative   Son [name] born  02/2008 and daughter [name] born 06/2009   Ivin Booty born 03/22/2013    Outpatient Medications Prior to Visit  Medication Sig Dispense Refill  . ALPRAZolam (XANAX) 0.25 MG tablet Take 1 tablet (0.25 mg total) by mouth 2 (two) times daily as needed for anxiety. 30 tablet 2  . escitalopram (LEXAPRO) 10 MG tablet Take 1 tablet (10 mg total) by mouth daily. 90 tablet 0  . fluticasone (FLONASE) 50 MCG/ACT nasal spray Place 2 sprays into both nostrils daily as needed for allergies or rhinitis. 16 g 6  . montelukast (SINGULAIR) 10 MG tablet Take 1 tablet (10 mg total) by mouth at bedtime. 90 tablet 1  . traMADol (ULTRAM) 50 MG tablet TAKE 1 TABLET BY MOUTH THREE TIMES DAILY AS NEEDED FOR MODERATE PAIN 90 tablet 0  . atenolol (TENORMIN) 100 MG tablet Take 1 tablet (100 mg total) by mouth daily. (Patient not taking: Reported on 05/13/2016) 30 tablet 5  . QUEtiapine (SEROQUEL) 100 MG tablet 1/2 tablet po qhs x 1 day then 1 tablet po qhs x 1 day 2 tablets po qhs x 1 day then 3 tablets po qhs daily (Patient not taking: Reported on 05/13/2016)  90 tablet 1   No facility-administered medications prior to visit.     No Known Allergies  Review of Systems  Constitutional: Positive for malaise/fatigue. Negative for fever.  HENT: Negative for congestion.   Eyes: Negative for blurred vision.  Respiratory: Negative for shortness of breath.   Cardiovascular: Negative for chest pain, palpitations and leg swelling.  Gastrointestinal: Negative for abdominal pain, blood in stool and nausea.  Genitourinary: Negative for dysuria and frequency.  Musculoskeletal: Negative for falls.  Skin: Negative for rash.  Neurological: Negative for dizziness, loss of consciousness and headaches.  Endo/Heme/Allergies: Negative for environmental allergies.  Psychiatric/Behavioral: Negative for depression. The patient has insomnia. The patient is not nervous/anxious.        Objective:    Physical Exam  Constitutional: She is  oriented to person, place, and time. She appears well-developed and well-nourished. No distress.  HENT:  Head: Normocephalic and atraumatic.  Nose: Nose normal.  Eyes: Right eye exhibits no discharge. Left eye exhibits no discharge.  Neck: Normal range of motion. Neck supple.  Cardiovascular: Normal rate and regular rhythm.   No murmur heard. Pulmonary/Chest: Effort normal and breath sounds normal.  Abdominal: Soft. Bowel sounds are normal. There is no tenderness.  Musculoskeletal: She exhibits no edema.  Neurological: She is alert and oriented to person, place, and time.  Skin: Skin is warm and dry.  Psychiatric: She has a normal mood and affect.  Nursing note and vitals reviewed.   BP 124/80 (BP Location: Left Arm, Patient Position: Sitting, Cuff Size: Large)   Pulse 72   Temp 97.5 F (36.4 C) (Oral)   Wt 180 lb 3.2 oz (81.7 kg)   LMP 04/29/2016   SpO2 97% Comment: RA  BMI 30.93 kg/m  Wt Readings from Last 3 Encounters:  05/13/16 180 lb 3.2 oz (81.7 kg)  03/18/16 176 lb 3.2 oz (79.9 kg)  07/15/15 170 lb 4 oz (77.2 kg)     Lab Results  Component Value Date   WBC 9.1 07/17/2015   HGB 12.0 07/17/2015   HCT 36.1 07/17/2015   PLT 248.0 07/17/2015   GLUCOSE 78 10/25/2014   CHOL 199 10/25/2014   TRIG 88.0 10/25/2014   HDL 62.50 10/25/2014   LDLDIRECT 177.4 06/15/2013   LDLCALC 119 (H) 10/25/2014   ALT 10 10/25/2014   AST 17 10/25/2014   NA 135 10/25/2014   K 3.8 10/25/2014   CL 102 10/25/2014   CREATININE 0.74 10/25/2014   BUN 8 10/25/2014   CO2 28 10/25/2014   TSH 0.54 07/17/2015    Lab Results  Component Value Date   TSH 0.54 07/17/2015   Lab Results  Component Value Date   WBC 9.1 07/17/2015   HGB 12.0 07/17/2015   HCT 36.1 07/17/2015   MCV 84.1 07/17/2015   PLT 248.0 07/17/2015   Lab Results  Component Value Date   NA 135 10/25/2014   K 3.8 10/25/2014   CO2 28 10/25/2014   GLUCOSE 78 10/25/2014   BUN 8 10/25/2014   CREATININE 0.74 10/25/2014     BILITOT 0.5 10/25/2014   ALKPHOS 98 10/25/2014   AST 17 10/25/2014   ALT 10 10/25/2014   PROT 7.0 10/25/2014   ALBUMIN 4.4 10/25/2014   CALCIUM 9.3 10/25/2014   GFR 118.86 10/25/2014   Lab Results  Component Value Date   CHOL 199 10/25/2014   Lab Results  Component Value Date   HDL 62.50 10/25/2014   Lab Results  Component Value Date   LDLCALC  119 (H) 10/25/2014   Lab Results  Component Value Date   TRIG 88.0 10/25/2014   Lab Results  Component Value Date   CHOLHDL 3 10/25/2014   No results found for: HGBA1C     Assessment & Plan:   Problem List Items Addressed This Visit    HYPERCHOLESTEROLEMIA    .Encouraged heart healthy diet, increase exercise, avoid trans fats, consider a krill oil cap daily      Essential hypertension    Well controlled, no changes to meds. Encouraged heart healthy diet such as the DASH diet and exercise as tolerated.       Insomnia    Encouraged good sleep hygiene such as dark, quiet room. No blue/Dorow glowing lights such as computer screens in bedroom. No alcohol or stimulants in evening. Cut down on caffeine as able. Regular exercise is helpful but not just prior to bed time.          I have discontinued Ms. Sires's atenolol and QUEtiapine. I am also having her maintain her fluticasone, escitalopram, ALPRAZolam, traMADol, and montelukast.  No orders of the defined types were placed in this encounter.    Danise EdgeBLYTH, Gurdeep Keesey, MD

## 2016-05-17 ENCOUNTER — Encounter: Payer: Self-pay | Admitting: Family Medicine

## 2016-05-17 DIAGNOSIS — R5383 Other fatigue: Secondary | ICD-10-CM

## 2016-06-06 NOTE — Progress Notes (Addendum)
Note opened in error somehow Closed

## 2016-06-07 NOTE — Progress Notes (Signed)
This encounter was created in error - please disregard.

## 2016-06-08 NOTE — Procedures (Signed)
error 

## 2016-11-02 ENCOUNTER — Other Ambulatory Visit: Payer: Self-pay | Admitting: Family Medicine

## 2016-11-02 NOTE — Telephone Encounter (Signed)
°  Relation to UY:QIHKpt:self Call back [phone] [facility]acy: [id number] [place], [place] - [address] [phone] (Phone) [phone] (Fax)     Reason for call:  Patient requesting a refill ALPRAZolam (XANAX) 0.25 MG tablet

## 2016-11-02 NOTE — Telephone Encounter (Signed)
She can have 30 day supply but no more til I see her because she has to be seen every 6 months to keep this medicine active

## 2016-11-02 NOTE — Telephone Encounter (Signed)
Requesting: Xanax Contract:yes UDS: 05/13/16 next screen 11/10/16 LOW RISK Last OV:05/13/16 Last Refill:03/18/16  #[id number]   Please Advise

## 2016-11-04 MED ORDER — ALPRAZOLAM 0.25 MG PO TABS: 0.2500 mg | ORAL_TABLET | Freq: Two times a day (BID) | ORAL | 0 refills | Status: DC | PRN

## 2016-11-04 NOTE — Telephone Encounter (Signed)
Rx printed, awaiting MD signature.  

## 2016-11-04 NOTE — Addendum Note (Signed)
Addended byConrad Oso: Tyrene Nader D on: 11/04/2016 04:01 PM   Modules accepted: Orders

## 2016-11-05 NOTE — Telephone Encounter (Signed)
Left message to call the ofc back   PC

## 2016-12-07 ENCOUNTER — Encounter: Payer: Self-pay | Admitting: Family Medicine

## 2016-12-07 ENCOUNTER — Ambulatory Visit (INDEPENDENT_AMBULATORY_CARE_PROVIDER_SITE_OTHER): Payer: 59 | Admitting: Family Medicine

## 2016-12-07 VITALS — BP 142/82 | HR 91 | Temp 98.2°F | Resp 18 | Wt 184.6 lb

## 2016-12-07 DIAGNOSIS — I10 Essential (primary) hypertension: Secondary | ICD-10-CM | POA: Diagnosis not present

## 2016-12-07 DIAGNOSIS — E7800 Pure hypercholesterolemia, unspecified: Secondary | ICD-10-CM | POA: Diagnosis not present

## 2016-12-07 DIAGNOSIS — G43809 Other migraine, not intractable, without status migrainosus: Secondary | ICD-10-CM | POA: Diagnosis not present

## 2016-12-07 DIAGNOSIS — M549 Dorsalgia, unspecified: Secondary | ICD-10-CM

## 2016-12-07 DIAGNOSIS — F341 Dysthymic disorder: Secondary | ICD-10-CM | POA: Diagnosis not present

## 2016-12-07 MED ORDER — ALPRAZOLAM 0.25 MG PO TABS: 0.2500 mg | ORAL_TABLET | Freq: Two times a day (BID) | ORAL | 1 refills | Status: DC | PRN

## 2016-12-07 MED ORDER — ESCITALOPRAM OXALATE 10 MG PO TABS: 5.0000 mg | ORAL_TABLET | Freq: Every day | ORAL | 3 refills | Status: DC

## 2016-12-07 NOTE — Patient Instructions (Signed)
Tylenol PM Melatonin 2-10 mg to sleep Panic Attacks Panic attacks are sudden, short feelings of great fear or discomfort. You may have them for no reason when you are relaxed, when you are uneasy (anxious), or when you are sleeping. Follow these instructions at home:  Take all your medicines as told.  Check with your doctor before starting new medicines.  Keep all doctor visits. Contact a doctor if:  You are not able to take your medicines as told.  Your symptoms do not get better.  Your symptoms get worse. Get help right away if:  Your attacks seem different than your normal attacks.  You have thoughts about hurting yourself or others.  You take panic attack medicine and you have a side effect. This information is not intended to replace advice given to you by your health care provider. Make sure you discuss any questions you have with your health care provider. Document Released: 05/29/2010 Document Revised: 10/02/2015 Document Reviewed: 12/08/2012 Elsevier Interactive Patient Education  2017 [employer]

## 2016-12-07 NOTE — Progress Notes (Signed)
Subjective:  I acted as a Neurosurgeonscribe for Dr. Abner GreenspanBlyth. [place], [place]  Patient ID: Megan Hunt, female    DOB: 05/14/1985, 31 y.o.   MRN: [id number]  No chief complaint on file.   HPI  Patient is in today for a medication follow up She acknowledges anxiety with palpitations, difficulty concentrating, hot flashes and episodes of feeling tired and weak when the anxiety gets high. She was better while taking Lexapro but has not taken it consistently. She now initiate less irritability and lashing out. No recent illness. Reports her allergies and back pain well controlled. She has not needed tramadol. Denies CP/SOB/HA/congestion/fevers/GI or GU c/o. Taking meds as prescribed  Patient Care Team: Bradd CanaryBlyth, Stacey A, MD as PCP - General (Family Medicine) Kathreen CosierMarshall, Bernard A, MD (Inactive) as Consulting Physician (Obstetrics and Gynecology)   Past Medical History:  Diagnosis Date  . Allergic rhinitis   . Allergy   . Anemia   . Anxiety   . Anxiety and depression   . Asthma   . Asthma, mild intermittent 10/27/2008   Qualifier: Diagnosis of  By: Kriste BasqueNadel MD, Lonzo CloudScott M   . Atypical chest pain   . Back pain   . Constipation   . History of migraines   . Hypercholesteremia   . Hypertension    no longer taking meds  . Insomnia 05/15/2016  . Somatic dysfunction     Past Surgical History:  Procedure Laterality Date  . WISDOM TOOTH EXTRACTION      Family History  Problem Relation Age of Onset  . Hypertension Father   . Arthritis Brother   . Stroke Maternal Grandfather   . Hypertension Maternal Grandfather     Social History   Social History  . Marital status: Married    Spouse name: N/A  . Number of children: 2  . Years of education: N/A   Occupational History  . 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      grad [employer] degree in social work   Social History Main Topics  . Smoking status: Never Smoker  . Smokeless tobacco: Never Used  . Alcohol use No  . Drug use: No  .  Sexual activity: Yes    Birth control/ protection: None     Comment: lives with husband and 3 small children, no dietary restrictions, stays at home   Other Topics Concern  . Not on file   Social History Narrative   Son [place]jordan born 02/2008 and daughter Penne Lashjamaya born 06/2009   Ivin BootyJoshua born 03/22/2013    Outpatient Medications Prior to Visit  Medication Sig Dispense Refill  . montelukast (SINGULAIR) 10 MG tablet Take 1 tablet (10 mg total) by mouth at bedtime. 90 tablet 1  . ALPRAZolam (XANAX) 0.25 MG tablet Take 1 tablet (0.25 mg total) by mouth 2 (two) times daily as needed for anxiety. 60 tablet 0  . fluticasone (FLONASE) 50 MCG/ACT nasal spray Place 2 sprays into both nostrils daily as needed for allergies or rhinitis. 16 g 6  . escitalopram (LEXAPRO) 10 MG tablet Take 1 tablet (10 mg total) by mouth daily. 90 tablet 0  . traMADol (ULTRAM) 50 MG tablet TAKE 1 TABLET BY MOUTH THREE TIMES DAILY AS NEEDED FOR MODERATE PAIN 90 tablet 0   No facility-administered medications prior to visit.     No Known Allergies  Review of Systems  Constitutional: Positive for malaise/fatigue. Negative for fever.  HENT: Negative for congestion.   Eyes: Negative  for blurred vision.  Respiratory: Negative for shortness of breath.   Cardiovascular: Positive for palpitations. Negative for chest pain and leg swelling.  Gastrointestinal: Negative for abdominal pain, blood in stool and nausea.  Genitourinary: Negative for dysuria and frequency.  Musculoskeletal: Negative for falls.  Skin: Negative for rash.  Neurological: Negative for dizziness, loss of consciousness and headaches.  Endo/Heme/Allergies: Negative for environmental allergies.  Psychiatric/Behavioral: Positive for depression. The patient is nervous/anxious.        Objective:    Physical Exam  Constitutional: She is oriented to person, place, and time. She appears well-developed and well-nourished. No distress.  HENT:  Head:  Normocephalic and atraumatic.  Nose: Nose normal.  Eyes: Right eye exhibits no discharge. Left eye exhibits no discharge.  Neck: Normal range of motion. Neck supple.  Cardiovascular: Normal rate and regular rhythm.   No murmur heard. Pulmonary/Chest: Effort normal and breath sounds normal.  Abdominal: Soft. Bowel sounds are normal. There is no tenderness.  Musculoskeletal: She exhibits no edema.  Neurological: She is alert and oriented to person, place, and time.  Skin: Skin is warm and dry.  Psychiatric: She has a normal mood and affect.  Nursing note and vitals reviewed.   BP (!) 142/82 (BP Location: Left Arm, Patient Position: Sitting, Cuff Size: Normal)   Pulse 91   Temp 98.2 F (36.8 C) (Oral)   Resp 18   Wt 184 lb 9.6 oz (83.7 kg)   SpO2 99%   BMI 31.69 kg/m  Wt Readings from Last 3 Encounters:  12/07/16 184 lb 9.6 oz (83.7 kg)  05/13/16 180 lb 3.2 oz (81.7 kg)  03/18/16 176 lb 3.2 oz (79.9 kg)   BP Readings from Last 3 Encounters:  12/07/16 (!) 142/82  05/13/16 124/80  03/18/16 126/82     Immunization History  Administered Date(s) Administered  . DTaP 01/01/2011  . Influenza Whole 06/24/2009  . Influenza,inj,Quad PF,36+ Mos 03/23/2013, 03/11/2015, 03/18/2016  . Tdap 03/23/2013    Health Maintenance  Topic Date Due  . INFLUENZA VACCINE  12/08/2016  . PAP SMEAR  08/28/2018  . TETANUS/TDAP  03/24/2023  . HIV Screening  Completed    Lab Results  Component Value Date   WBC 10.6 (H) 12/07/2016   HGB 12.7 12/07/2016   HCT 39.5 12/07/2016   PLT 256.0 12/07/2016   GLUCOSE 84 12/07/2016   CHOL 209 (H) 12/07/2016   TRIG 136.0 12/07/2016   HDL 68.90 12/07/2016   LDLDIRECT 177.4 06/15/2013   LDLCALC 113 (H) 12/07/2016   ALT 12 12/07/2016   AST 15 12/07/2016   NA 138 12/07/2016   K 4.1 12/07/2016   CL 104 12/07/2016   CREATININE 0.83 12/07/2016   BUN 9 12/07/2016   CO2 27 12/07/2016   TSH 0.75 12/07/2016    Lab Results  Component Value Date   TSH  0.75 12/07/2016   Lab Results  Component Value Date   WBC 10.6 (H) 12/07/2016   HGB 12.7 12/07/2016   HCT 39.5 12/07/2016   MCV 85.4 12/07/2016   PLT 256.0 12/07/2016   Lab Results  Component Value Date   NA 138 12/07/2016   K 4.1 12/07/2016   CO2 27 12/07/2016   GLUCOSE 84 12/07/2016   BUN 9 12/07/2016   CREATININE 0.83 12/07/2016   BILITOT 0.3 12/07/2016   ALKPHOS 122 (H) 12/07/2016   AST 15 12/07/2016   ALT 12 12/07/2016   PROT 7.5 12/07/2016   ALBUMIN 4.5 12/07/2016   CALCIUM 9.7 12/07/2016  GFR 102.66 12/07/2016   Lab Results  Component Value Date   CHOL 209 (H) 12/07/2016   Lab Results  Component Value Date   HDL 68.90 12/07/2016   Lab Results  Component Value Date   LDLCALC 113 (H) 12/07/2016   Lab Results  Component Value Date   TRIG 136.0 12/07/2016   Lab Results  Component Value Date   CHOLHDL 3 12/07/2016   No results found for: HGBA1C       Assessment & Plan:   Problem List Items Addressed This Visit    HYPERCHOLESTEROLEMIA    Encouraged heart healthy diet, increase exercise, avoid trans fats, consider a krill oil cap daily      ANXIETY DEPRESSION    Notes she feels more calm and less irritable on Lexapro encouraged to take it daily.  May use Alprazolam prn      Relevant Medications   escitalopram (LEXAPRO) 10 MG tablet   ALPRAZolam (XANAX) 0.25 MG tablet   Essential hypertension - Primary    Well controlled, no changes to meds. Encouraged heart healthy diet such as the DASH diet and exercise as tolerated.       Relevant Orders   CBC with Differential/Platelet (Completed)   Comprehensive metabolic panel (Completed)   Lipid panel (Completed)   TSH (Completed)   Backache    Not needing Tramadol. Encouraged moist heat and gentle stretching as tolerated. May try NSAIDs and prescription meds as directed and report if symptoms worsen or seek immediate care if pain recures and is severe.       Migraine    Encouraged increased  hydration, 64 ounces of clear fluids daily. Minimize alcohol and caffeine. Eat small frequent meals with lean proteins and complex carbs. Avoid high and low blood sugars. Get adequate sleep, 7-8 hours a night. Needs exercise daily preferably in the morning.      Relevant Medications   escitalopram (LEXAPRO) 10 MG tablet      I have discontinued Ms. Gravett's fluticasone and traMADol. I have also changed her escitalopram. Additionally, I am having her maintain her montelukast and ALPRAZolam.  Meds ordered this encounter  Medications  . escitalopram (LEXAPRO) 10 MG tablet    Sig: Take 0.5-1 tablets (5-10 mg total) by mouth daily.    Dispense:  30 tablet    Refill:  3  . ALPRAZolam (XANAX) 0.25 MG tablet    Sig: Take 1 tablet (0.25 mg total) by mouth 2 (two) times daily as needed for anxiety.    Dispense:  60 tablet    Refill:  1    Requested drug refills are authorized, however, the patient needs further evaluation and/or laboratory testing before further refills are given. Ask her to make an appointment for this.    CMA served as Neurosurgeonscribe during this visit. History, Physical and Plan performed by medical provider. Documentation and orders reviewed and attested to.  Danise EdgeStacey Blyth, MD

## 2016-12-08 LAB — CBC WITH DIFFERENTIAL/PLATELET
Basophils Absolute: 0.1 10*3/uL (ref 0.0–0.1)
Basophils Relative: 1.2 % (ref 0.0–3.0)
EOS PCT: 1.1 % (ref 0.0–5.0)
Eosinophils Absolute: 0.1 10*3/uL (ref 0.0–0.7)
HEMATOCRIT: 39.5 % (ref 36.0–46.0)
HEMOGLOBIN: 12.7 g/dL (ref 12.0–15.0)
Lymphocytes Relative: 30 % (ref 12.0–46.0)
Lymphs Abs: 3.2 10*3/uL (ref 0.7–4.0)
MCHC: 32.3 g/dL (ref 30.0–36.0)
MCV: 85.4 fl (ref 78.0–100.0)
MONOS PCT: 9.9 % (ref 3.0–12.0)
Monocytes Absolute: 1 10*3/uL (ref 0.1–1.0)
Neutro Abs: 6.2 10*3/uL (ref 1.4–7.7)
Neutrophils Relative %: 57.8 % (ref 43.0–77.0)
Platelets: 256 10*3/uL (ref 150.0–400.0)
RBC: 4.62 Mil/uL (ref 3.87–5.11)
RDW: 12.8 % (ref 11.5–15.5)
WBC: 10.6 10*3/uL — AB (ref 4.0–10.5)

## 2016-12-08 LAB — COMPREHENSIVE METABOLIC PANEL
ALBUMIN: 4.5 g/dL (ref 3.5–5.2)
ALK PHOS: 122 U/L — AB (ref 39–117)
ALT: 12 U/L (ref 0–35)
AST: 15 U/L (ref 0–37)
BUN: 9 mg/dL (ref 6–23)
CO2: 27 mEq/L (ref 19–32)
Calcium: 9.7 mg/dL (ref 8.4–10.5)
Chloride: 104 mEq/L (ref 96–112)
Creatinine, Ser: 0.83 mg/dL (ref 0.40–1.20)
GFR: 102.66 mL/min (ref >60.00)
Glucose, Bld: 84 mg/dL (ref 70–99)
POTASSIUM: 4.1 meq/L (ref 3.5–5.1)
Sodium: 138 mEq/L (ref 135–145)
TOTAL PROTEIN: 7.5 g/dL (ref 6.0–8.3)
Total Bilirubin: 0.3 mg/dL (ref 0.2–1.2)

## 2016-12-08 LAB — LIPID PANEL
CHOLESTEROL: 209 mg/dL — AB (ref 0–200)
HDL: 68.9 mg/dL (ref >39.00)
LDL Cholesterol: 113 mg/dL — ABNORMAL HIGH (ref 0–99)
NonHDL: 140.55
Total CHOL/HDL Ratio: 3
Triglycerides: 136 mg/dL (ref 0.0–149.0)
VLDL: 27.2 mg/dL (ref 0.0–40.0)

## 2016-12-08 LAB — TSH: TSH: 0.75 u[IU]/mL (ref 0.35–4.50)

## 2016-12-12 NOTE — Assessment & Plan Note (Signed)
Encouraged heart healthy diet, increase exercise, avoid trans fats, consider a krill oil cap daily 

## 2016-12-12 NOTE — Assessment & Plan Note (Signed)
Notes she feels more calm and less irritable on Lexapro encouraged to take it daily.  May use Alprazolam prn

## 2016-12-12 NOTE — Assessment & Plan Note (Signed)
Encouraged increased hydration, 64 ounces of clear fluids daily. Minimize alcohol and caffeine. Eat small frequent meals with lean proteins and complex carbs. Avoid high and low blood sugars. Get adequate sleep, 7-8 hours a night. Needs exercise daily preferably in the morning.  

## 2016-12-12 NOTE — Assessment & Plan Note (Signed)
Not needing Tramadol. Encouraged moist heat and gentle stretching as tolerated. May try NSAIDs and prescription meds as directed and report if symptoms worsen or seek immediate care if pain recures and is severe.

## 2016-12-12 NOTE — Assessment & Plan Note (Signed)
Well controlled, no changes to meds. Encouraged heart healthy diet such as the DASH diet and exercise as tolerated.  °

## 2017-02-15 ENCOUNTER — Ambulatory Visit (INDEPENDENT_AMBULATORY_CARE_PROVIDER_SITE_OTHER): Payer: 59 | Admitting: Family Medicine

## 2017-02-15 ENCOUNTER — Encounter: Payer: Self-pay | Admitting: Family Medicine

## 2017-02-15 VITALS — BP 122/78 | HR 70 | Temp 98.3°F | Resp 18 | Wt 170.0 lb

## 2017-02-15 DIAGNOSIS — G43809 Other migraine, not intractable, without status migrainosus: Secondary | ICD-10-CM | POA: Diagnosis not present

## 2017-02-15 DIAGNOSIS — F341 Dysthymic disorder: Secondary | ICD-10-CM

## 2017-02-15 DIAGNOSIS — Z23 Encounter for immunization: Secondary | ICD-10-CM

## 2017-02-15 DIAGNOSIS — I10 Essential (primary) hypertension: Secondary | ICD-10-CM

## 2017-02-15 DIAGNOSIS — E7800 Pure hypercholesterolemia, unspecified: Secondary | ICD-10-CM

## 2017-02-15 MED ORDER — ALPRAZOLAM 0.25 MG PO TABS: 0.2500 mg | ORAL_TABLET | Freq: Two times a day (BID) | ORAL | 1 refills | Status: DC | PRN

## 2017-02-15 MED ORDER — ESCITALOPRAM OXALATE 10 MG PO TABS: 5.0000 mg | ORAL_TABLET | Freq: Every day | ORAL | 1 refills | Status: DC

## 2017-02-15 NOTE — Progress Notes (Signed)
Subjective:  I acted as a Neurosurgeon for Dr. Abner Greenspan. [place], [place]  Patient ID: Megan Hunt, female    DOB: Apr 21, 1986, 31 y.o.   MRN: [id number]  No chief complaint on file.   HPI  Patient is in today for a 3 month follow up and she reports she is dong well presently. No recent febrile illness or hospitalizations. Is doing well on the Lexapro and acknowledges she feels much better. She feels much calmer and is finding life more enjoyable. Very little anxiety attacks. She has used the Alprazolam prn very infrequently. Denies CP/palp/SOB/HA/congestion/fevers/GI or GU c/o. Taking meds as prescribed  Patient Care Team: Bradd Canary, MD as PCP - General (Family Medicine) Kathreen Cosier, MD (Inactive) as Consulting Physician (Obstetrics and Gynecology)   Past Medical History:  Diagnosis Date  . Allergic rhinitis   . Allergy   . Anemia   . Anxiety   . Anxiety and depression   . Asthma   . Asthma, mild intermittent 10/27/2008   Qualifier: Diagnosis of  By: Kriste Basque MD, Lonzo Cloud   . Atypical chest pain   . Back pain   . Constipation   . History of migraines   . Hypercholesteremia   . Hypertension    no longer taking meds  . Insomnia 05/15/2016  . Somatic dysfunction     Past Surgical History:  Procedure Laterality Date  . WISDOM TOOTH EXTRACTION      Family History  Problem Relation Age of Onset  . Hypertension Father   . Arthritis Brother   . Stroke Maternal Grandfather   . Hypertension Maternal Grandfather     Social History   Social History  . Marital status: Married    Spouse name: N/A  . Number of children: 2  . Years of education: N/A   Occupational History  . 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      grad [employer] degree in social work   Social History Main Topics  . Smoking status: Never Smoker  . Smokeless tobacco: Never Used  . Alcohol use No  . Drug use: No  . Sexual activity: Yes    Birth control/ protection: None     Comment:  lives with husband and 3 small children, no dietary restrictions, stays at home   Other Topics Concern  . Not on file   Social History Narrative   Son [name] born 02/2008 and daughter [name] born 06/2009   Ivin Booty born 03/22/2013    Outpatient Medications Prior to Visit  Medication Sig Dispense Refill  . montelukast (SINGULAIR) 10 MG tablet Take 1 tablet (10 mg total) by mouth at bedtime. 90 tablet 1  . ALPRAZolam (XANAX) 0.25 MG tablet Take 1 tablet (0.25 mg total) by mouth 2 (two) times daily as needed for anxiety. 60 tablet 1  . escitalopram (LEXAPRO) 10 MG tablet Take 0.5-1 tablets (5-10 mg total) by mouth daily. 30 tablet 3   No facility-administered medications prior to visit.     No Known Allergies  Review of Systems  Constitutional: Negative for fever and malaise/fatigue.  HENT: Negative for congestion.   Eyes: Negative for blurred vision.  Respiratory: Negative for cough and shortness of breath.   Cardiovascular: Negative for chest pain, palpitations and leg swelling.  Gastrointestinal: Negative for vomiting.  Musculoskeletal: Negative for back pain.  Skin: Negative for rash.  Neurological: Negative for loss of consciousness and headaches.       Objective:  Physical Exam  Constitutional: She is oriented to person, place, and time. She appears well-developed and well-nourished. No distress.  HENT:  Head: Normocephalic and atraumatic.  Eyes: Conjunctivae are normal.  Neck: Normal range of motion. No thyromegaly present.  Cardiovascular: Normal rate and regular rhythm.   Pulmonary/Chest: Effort normal and breath sounds normal. She has no wheezes.  Abdominal: Soft. Bowel sounds are normal. There is no tenderness.  Musculoskeletal: Normal range of motion. She exhibits no edema or deformity.  Neurological: She is alert and oriented to person, place, and time.  Skin: Skin is warm and dry. She is not diaphoretic.  Psychiatric: She has a normal mood and affect.     BP 122/78 (BP Location: Left Arm, Patient Position: Sitting, Cuff Size: Normal)   Pulse 70   Temp 98.3 F (36.8 C) (Oral)   Resp 18   Wt 170 lb (77.1 kg)   LMP 01/25/2017   SpO2 99%   BMI 29.18 kg/m  Wt Readings from Last 3 Encounters:  02/15/17 170 lb (77.1 kg)  12/07/16 184 lb 9.6 oz (83.7 kg)  05/13/16 180 lb 3.2 oz (81.7 kg)   BP Readings from Last 3 Encounters:  02/15/17 122/78  12/07/16 (!) 142/82  05/13/16 124/80     Immunization History  Administered Date(s) Administered  . DTaP 01/01/2011  . Influenza Whole 06/24/2009  . Influenza,inj,Quad PF,6+ Mos 03/23/2013, 03/11/2015, 03/18/2016, 02/15/2017  . Tdap 03/23/2013    Health Maintenance  Topic Date Due  . PAP SMEAR  08/28/2018  . TETANUS/TDAP  03/24/2023  . INFLUENZA VACCINE  Completed  . HIV Screening  Completed    Lab Results  Component Value Date   WBC 10.6 (H) 12/07/2016   HGB 12.7 12/07/2016   HCT 39.5 12/07/2016   PLT 256.0 12/07/2016   GLUCOSE 84 12/07/2016   CHOL 209 (H) 12/07/2016   TRIG 136.0 12/07/2016   HDL 68.90 12/07/2016   LDLDIRECT 177.4 06/15/2013   LDLCALC 113 (H) 12/07/2016   ALT 12 12/07/2016   AST 15 12/07/2016   NA 138 12/07/2016   K 4.1 12/07/2016   CL 104 12/07/2016   CREATININE 0.83 12/07/2016   BUN 9 12/07/2016   CO2 27 12/07/2016   TSH 0.75 12/07/2016    Lab Results  Component Value Date   TSH 0.75 12/07/2016   Lab Results  Component Value Date   WBC 10.6 (H) 12/07/2016   HGB 12.7 12/07/2016   HCT 39.5 12/07/2016   MCV 85.4 12/07/2016   PLT 256.0 12/07/2016   Lab Results  Component Value Date   NA 138 12/07/2016   K 4.1 12/07/2016   CO2 27 12/07/2016   GLUCOSE 84 12/07/2016   BUN 9 12/07/2016   CREATININE 0.83 12/07/2016   BILITOT 0.3 12/07/2016   ALKPHOS 122 (H) 12/07/2016   AST 15 12/07/2016   ALT 12 12/07/2016   PROT 7.5 12/07/2016   ALBUMIN 4.5 12/07/2016   CALCIUM 9.7 12/07/2016   GFR 102.66 12/07/2016   Lab Results  Component Value  Date   CHOL 209 (H) 12/07/2016   Lab Results  Component Value Date   HDL 68.90 12/07/2016   Lab Results  Component Value Date   LDLCALC 113 (H) 12/07/2016   Lab Results  Component Value Date   TRIG 136.0 12/07/2016   Lab Results  Component Value Date   CHOLHDL 3 12/07/2016   No results found for: HGBA1C       Assessment & Plan:   Problem List  Items Addressed This Visit    HYPERCHOLESTEROLEMIA    Encouraged heart healthy diet, increase exercise, avoid trans fats, consider a krill oil cap daily      ANXIETY DEPRESSION    Doing well on Lexapro and needs Alprazolam very infrequently at this time. No changes today      Relevant Medications   escitalopram (LEXAPRO) 10 MG tablet   ALPRAZolam (XANAX) 0.25 MG tablet   Essential hypertension    Well controlled, no changes to meds. Encouraged heart healthy diet such as the DASH diet and exercise as tolerated.       Migraine    Encouraged increased hydration, 64 ounces of clear fluids daily. Minimize alcohol and caffeine. Eat small frequent meals with lean proteins and complex carbs. Avoid high and low blood sugars. Get adequate sleep, 7-8 hours a night. Needs exercise daily preferably in the morning.      Relevant Medications   escitalopram (LEXAPRO) 10 MG tablet    Other Visit Diagnoses    Needs flu shot    -  Primary   Relevant Orders   Flu Vaccine QUAD 6+ mos PF IM (Fluarix Quad PF) (Completed)      I am having Ms. Catherman maintain her montelukast, escitalopram, and ALPRAZolam.  Meds ordered this encounter  Medications  . escitalopram (LEXAPRO) 10 MG tablet    Sig: Take 0.5-1 tablets (5-10 mg total) by mouth daily.    Dispense:  90 tablet    Refill:  1  . ALPRAZolam (XANAX) 0.25 MG tablet    Sig: Take 1 tablet (0.25 mg total) by mouth 2 (two) times daily as needed for anxiety.    Dispense:  60 tablet    Refill:  1    CMA served as scribe during this visit. History, Physical and Plan performed by medical  provider. Documentation and orders reviewed and attested to.  Danise Edge, MD

## 2017-02-15 NOTE — Patient Instructions (Signed)

## 2017-02-16 NOTE — Assessment & Plan Note (Signed)
Encouraged heart healthy diet, increase exercise, avoid trans fats, consider a krill oil cap daily 

## 2017-02-16 NOTE — Assessment & Plan Note (Signed)
Well controlled, no changes to meds. Encouraged heart healthy diet such as the DASH diet and exercise as tolerated.  °

## 2017-02-16 NOTE — Assessment & Plan Note (Signed)
Encouraged increased hydration, 64 ounces of clear fluids daily. Minimize alcohol and caffeine. Eat small frequent meals with lean proteins and complex carbs. Avoid high and low blood sugars. Get adequate sleep, 7-8 hours a night. Needs exercise daily preferably in the morning.  

## 2017-02-16 NOTE — Assessment & Plan Note (Signed)
Doing well on Lexapro and needs Alprazolam very infrequently at this time. No changes today

## 2017-04-06 ENCOUNTER — Other Ambulatory Visit: Payer: Self-pay | Admitting: Family Medicine

## 2017-04-06 ENCOUNTER — Telehealth: Payer: Self-pay | Admitting: *Deleted

## 2017-04-06 NOTE — Telephone Encounter (Signed)
[facility], completed as much as possible; forwarded to provider/SLS 11/28

## 2017-05-05 ENCOUNTER — Other Ambulatory Visit: Payer: Self-pay

## 2017-05-05 MED ORDER — ESCITALOPRAM OXALATE 10 MG PO TABS: 5.0000 mg | ORAL_TABLET | Freq: Every day | ORAL | 0 refills | Status: DC

## 2017-05-10 NOTE — L&D Delivery Note (Signed)
Delivery Note At 1:43 PM a viable female was delivered via Vaginal, Spontaneous (Presentation: vtx, LOA).  APGAR: 9, 9; weight pending.   Placenta status: spontaneous, intact.  Cord: 3vc, with the following complications: none.    Called to patient's room for precipitous delivery - patient had just laid back from getting epidural. Baby crowning upon my arrival. Delivered with 1 push. Loose double nuchal cord.  Anesthesia:  epidural Episiotomy:  none Lacerations:  none Est. Blood Loss (mL):  150mL  Mom to postpartum.  Baby to Couplet care / Skin to Skin.  Megan HeritageJacob J Jackie Littlejohn 10/25/2017, 1:55 PM

## 2017-05-11 ENCOUNTER — Ambulatory Visit (INDEPENDENT_AMBULATORY_CARE_PROVIDER_SITE_OTHER): Payer: 59

## 2017-05-11 DIAGNOSIS — N912 Amenorrhea, unspecified: Secondary | ICD-10-CM

## 2017-05-11 DIAGNOSIS — Z3201 Encounter for pregnancy test, result positive: Secondary | ICD-10-CM | POA: Diagnosis not present

## 2017-05-11 LAB — POCT URINE PREGNANCY: Preg Test, Ur: POSITIVE — AB

## 2017-05-11 NOTE — Progress Notes (Signed)
Megan Hunt presents today for UPT. She has no unusual complaints. LMP: 01/25/17    OBJECTIVE: Appears well, in no apparent distress.  OB History    Gravida Para Term Preterm AB Living   3 3 3     3    SAB TAB Ectopic Multiple Live Births           3     Home UPT Result: Positive  In-Office UPT result: Positive  I have reviewed the patient's medical, obstetrical, social, and family histories, and medications.   ASSESSMENT: Positive pregnancy test  [facility]. Samples given.

## 2017-05-11 NOTE — Progress Notes (Signed)
Agree with nursing staff's documentation of this patient's clinic encounter.  Catalina AntiguaPeggy Manmeet Arzola, MD 05/11/2017 8:49 AM

## 2017-05-25 ENCOUNTER — Ambulatory Visit: Payer: 59 | Admitting: Obstetrics and Gynecology

## 2017-06-01 ENCOUNTER — Ambulatory Visit (INDEPENDENT_AMBULATORY_CARE_PROVIDER_SITE_OTHER): Payer: 59 | Admitting: Obstetrics & Gynecology

## 2017-06-01 ENCOUNTER — Encounter: Payer: Self-pay | Admitting: Obstetrics & Gynecology

## 2017-06-01 DIAGNOSIS — O99340 Other mental disorders complicating pregnancy, unspecified trimester: Secondary | ICD-10-CM

## 2017-06-01 DIAGNOSIS — O99342 Other mental disorders complicating pregnancy, second trimester: Secondary | ICD-10-CM

## 2017-06-01 DIAGNOSIS — O99512 Diseases of the respiratory system complicating pregnancy, second trimester: Secondary | ICD-10-CM

## 2017-06-01 DIAGNOSIS — Z3482 Encounter for supervision of other normal pregnancy, second trimester: Secondary | ICD-10-CM

## 2017-06-01 DIAGNOSIS — Z3480 Encounter for supervision of other normal pregnancy, unspecified trimester: Secondary | ICD-10-CM | POA: Diagnosis not present

## 2017-06-01 DIAGNOSIS — O10019 Pre-existing essential hypertension complicating pregnancy, unspecified trimester: Secondary | ICD-10-CM | POA: Insufficient documentation

## 2017-06-01 DIAGNOSIS — O99519 Diseases of the respiratory system complicating pregnancy, unspecified trimester: Secondary | ICD-10-CM

## 2017-06-01 DIAGNOSIS — Z113 Encounter for screening for infections with a predominantly sexual mode of transmission: Secondary | ICD-10-CM

## 2017-06-01 DIAGNOSIS — J45909 Unspecified asthma, uncomplicated: Secondary | ICD-10-CM

## 2017-06-01 DIAGNOSIS — F32A Depression, unspecified: Secondary | ICD-10-CM

## 2017-06-01 DIAGNOSIS — O10012 Pre-existing essential hypertension complicating pregnancy, second trimester: Secondary | ICD-10-CM

## 2017-06-01 DIAGNOSIS — F329 Major depressive disorder, single episode, unspecified: Secondary | ICD-10-CM | POA: Insufficient documentation

## 2017-06-01 DIAGNOSIS — Z1151 Encounter for screening for human papillomavirus (HPV): Secondary | ICD-10-CM | POA: Diagnosis not present

## 2017-06-01 DIAGNOSIS — Z124 Encounter for screening for malignant neoplasm of cervix: Secondary | ICD-10-CM

## 2017-06-01 NOTE — Progress Notes (Signed)
  Subjective:    Megan Hunt is being seen today for her first obstetrical visit.  This is not a planned pregnancy. They are adjusting to the news. She is at 852w1d gestation. Her obstetrical history is significant for all heathy pregnancies.  Prev placenta previa that migrated prior to deliver.  Relationship with FOB: spouse, living together. Patient does intend to breast feed. Pregnancy history fully reviewed.  Patient reports no complaints.  Review of Systems:   Review of Systems  Objective:     BP (!) 151/88   Pulse (!) 108   Wt 195 lb (88.5 kg)   LMP 01/25/2017   BMI 33.47 kg/m  Physical Exam  Exam General Appearance:    Alert, cooperative, no distress, appears stated age  Head:    Normocephalic, without obvious abnormality, atraumatic  Eyes:    conjunctiva/corneas clear, EOM's intact, both eyes  Ears:    Normal external ear canals, both ears  Nose:   Nares normal, septum midline, mucosa normal, no drainage    or sinus tenderness  Throat:   Lips, mucosa, and tongue normal; teeth and gums normal  Neck:   Supple, symmetrical, trachea midline, no adenopathy;    thyroid:  no enlargement/tenderness/nodules  Back:     Symmetric, no curvature, ROM normal, no CVA tenderness  Lungs:     Clear to auscultation bilaterally, respirations unlabored  Chest Wall:    No tenderness or deformity   Heart:    Regular rate and rhythm, S1 and S2 normal, no murmur, rub   or gallop  Breast Exam:    No tenderness, masses, or nipple abnormality  Abdomen:     Soft, non-tender, bowel sounds active all four quadrants,    no masses, no organomegaly  Genitalia:    Normal female without lesion, discharge or tenderness; enlarged ~18-19. FH 1-2 cm below umbilicus.      Extremities:   Extremities normal, atraumatic, no cyanosis or edema  Pulses:   2+ and symmetric all extremities  Skin:   Skin color, texture, turgor normal, no rashes or lesions      Assessment:    Pregnancy: [id number] Patient Active  Problem List   Diagnosis Date Noted  . Supervision of other normal pregnancy, antepartum 06/01/2017  . Insomnia 05/15/2016  . Sinusitis, acute maxillary 08/08/2014  . Fatigue 08/08/2014  . Placenta previa antepartum in second trimester 10/31/2012  . Hemorrhoids, external 01/05/2011  . Fibromyalgia 01/05/2011  . Constipation 01/03/2010  . Essential hypertension 09/29/2009  . HYPERCHOLESTEROLEMIA 10/27/2008  . ANXIETY DEPRESSION 10/27/2008  . Allergic rhinitis 10/27/2008  . Asthma, mild intermittent 10/27/2008  . Backache 10/27/2008  . SOMATIC DYSFUNCTION 10/27/2008  . CHEST PAIN, ATYPICAL 10/27/2008  . Migraine 03/29/2007       Plan:     Initial labs drawn. Prenatal vitamins. Problem list reviewed and updated. AFP3 discussed: declined. Role of ultrasound in pregnancy discussed; fetal survey: requested. Amniocentesis discussed: not indicated. Follow up in 4 weeks. 60% of 40 min visit spent on counseling and coordination of care.  Baby ASA   Willodean RosenthalCarolyn Harraway-Smith 06/01/2017

## 2017-06-02 LAB — PROTEIN / CREATININE RATIO, URINE
Creatinine, Urine: 245.5 mg/dL
Protein, Ur: 23 mg/dL
Protein/Creat Ratio: 94 mg/g creat (ref 0–200)

## 2017-06-03 LAB — OBSTETRIC PANEL, INCLUDING HIV
Antibody Screen: NEGATIVE
BASOS ABS: 0 10*3/uL (ref 0.0–0.2)
Basos: 0 %
EOS (ABSOLUTE): 0.1 10*3/uL (ref 0.0–0.4)
Eos: 1 %
HEP B S AG: NEGATIVE
HIV Screen 4th Generation wRfx: NONREACTIVE
Hematocrit: 34.1 % (ref 34.0–46.6)
Hemoglobin: 11.1 g/dL (ref 11.1–15.9)
IMMATURE GRANULOCYTES: 0 %
Immature Grans (Abs): 0 10*3/uL (ref 0.0–0.1)
LYMPHS ABS: 1.9 10*3/uL (ref 0.7–3.1)
Lymphs: 14 %
MCH: 28 pg (ref 26.6–33.0)
MCHC: 32.6 g/dL (ref 31.5–35.7)
MCV: 86 fL (ref 79–97)
MONOS ABS: 1.2 10*3/uL — AB (ref 0.1–0.9)
Monocytes: 9 %
NEUTROS PCT: 76 %
Neutrophils Absolute: 10.1 10*3/uL — ABNORMAL HIGH (ref 1.4–7.0)
PLATELETS: 263 10*3/uL (ref 150–379)
RBC: 3.97 x10E6/uL (ref 3.77–5.28)
RDW: 13.5 % (ref 12.3–15.4)
RPR: NONREACTIVE
Rh Factor: POSITIVE
Rubella Antibodies, IGG: 2.51 index (ref >0.99)
WBC: 13.4 10*3/uL — ABNORMAL HIGH (ref 3.4–10.8)

## 2017-06-03 LAB — HEMOGLOBINOPATHY EVALUATION
HEMOGLOBIN A2 QUANTITATION: 2.2 % (ref 1.8–3.2)
HEMOGLOBIN F QUANTITATION: 0 % (ref 0.0–2.0)
HGB C: 0 %
HGB S: 0 %
HGB VARIANT: 0 %
Hgb A: 97.8 % (ref 96.4–98.8)

## 2017-06-03 LAB — COMPREHENSIVE METABOLIC PANEL
ALK PHOS: 128 IU/L — AB (ref 39–117)
ALT: 17 IU/L (ref 0–32)
AST: 16 IU/L (ref 0–40)
Albumin/Globulin Ratio: 1.4 (ref 1.2–2.2)
Albumin: 3.9 g/dL (ref 3.5–5.5)
BUN/Creatinine Ratio: 8 — ABNORMAL LOW (ref 9–23)
BUN: 5 mg/dL — ABNORMAL LOW (ref 6–20)
Bilirubin Total: <0.2 mg/dL (ref 0.0–1.2)
CALCIUM: 8.9 mg/dL (ref 8.7–10.2)
CO2: 21 mmol/L (ref 20–29)
CREATININE: 0.61 mg/dL (ref 0.57–1.00)
Chloride: 102 mmol/L (ref 96–106)
GFR calc Af Amer: 139 mL/min/{1.73_m2} (ref >59)
GFR, EST NON AFRICAN AMERICAN: 120 mL/min/{1.73_m2} (ref >59)
GLUCOSE: 91 mg/dL (ref 65–99)
Globulin, Total: 2.8 g/dL (ref 1.5–4.5)
Potassium: 4 mmol/L (ref 3.5–5.2)
Sodium: 138 mmol/L (ref 134–144)
Total Protein: 6.7 g/dL (ref 6.0–8.5)

## 2017-06-03 LAB — URINE CYTOLOGY ANCILLARY ONLY
Chlamydia: NEGATIVE
Neisseria Gonorrhea: NEGATIVE

## 2017-06-07 ENCOUNTER — Encounter (HOSPITAL_COMMUNITY): Payer: Self-pay | Admitting: Obstetrics & Gynecology

## 2017-06-07 LAB — CYTOLOGY - PAP
DIAGNOSIS: NEGATIVE
HPV (WINDOPATH): NOT DETECTED

## 2017-06-15 ENCOUNTER — Ambulatory Visit (HOSPITAL_COMMUNITY)
Admission: RE | Admit: 2017-06-15 | Discharge: 2017-06-15 | Disposition: A | Discharge status: Home / Self Care | Payer: 59 | Source: Ambulatory Visit | Attending: Obstetrics & Gynecology | Admitting: Obstetrics & Gynecology

## 2017-06-15 ENCOUNTER — Other Ambulatory Visit: Payer: Self-pay | Admitting: Obstetrics & Gynecology

## 2017-06-15 DIAGNOSIS — Z3480 Encounter for supervision of other normal pregnancy, unspecified trimester: Secondary | ICD-10-CM

## 2017-06-15 DIAGNOSIS — Z3A20 20 weeks gestation of pregnancy: Secondary | ICD-10-CM

## 2017-06-15 DIAGNOSIS — O10012 Pre-existing essential hypertension complicating pregnancy, second trimester: Secondary | ICD-10-CM | POA: Diagnosis not present

## 2017-06-15 DIAGNOSIS — Z3689 Encounter for other specified antenatal screening: Secondary | ICD-10-CM | POA: Insufficient documentation

## 2017-06-15 DIAGNOSIS — O0932 Supervision of pregnancy with insufficient antenatal care, second trimester: Secondary | ICD-10-CM | POA: Insufficient documentation

## 2017-06-16 NOTE — Addendum Note (Signed)
Encounter addended by: Willodean RosenthalHarraway-Smith, Danylah Holden, MD on: 06/16/2017 9:28 PM  Actions taken: Problem List modified

## 2017-06-29 ENCOUNTER — Ambulatory Visit (INDEPENDENT_AMBULATORY_CARE_PROVIDER_SITE_OTHER): Payer: 59 | Admitting: Obstetrics & Gynecology

## 2017-06-29 VITALS — BP 145/79 | HR 87 | Wt 201.0 lb

## 2017-06-29 DIAGNOSIS — Z3482 Encounter for supervision of other normal pregnancy, second trimester: Secondary | ICD-10-CM

## 2017-06-29 DIAGNOSIS — Z3480 Encounter for supervision of other normal pregnancy, unspecified trimester: Secondary | ICD-10-CM

## 2017-06-29 DIAGNOSIS — F341 Dysthymic disorder: Secondary | ICD-10-CM

## 2017-06-29 DIAGNOSIS — O99342 Other mental disorders complicating pregnancy, second trimester: Secondary | ICD-10-CM

## 2017-06-29 DIAGNOSIS — F32A Depression, unspecified: Secondary | ICD-10-CM

## 2017-06-29 DIAGNOSIS — J45909 Unspecified asthma, uncomplicated: Secondary | ICD-10-CM

## 2017-06-29 DIAGNOSIS — J301 Allergic rhinitis due to pollen: Secondary | ICD-10-CM

## 2017-06-29 DIAGNOSIS — O99519 Diseases of the respiratory system complicating pregnancy, unspecified trimester: Secondary | ICD-10-CM

## 2017-06-29 DIAGNOSIS — O99340 Other mental disorders complicating pregnancy, unspecified trimester: Secondary | ICD-10-CM

## 2017-06-29 DIAGNOSIS — F329 Major depressive disorder, single episode, unspecified: Secondary | ICD-10-CM

## 2017-06-29 DIAGNOSIS — O99512 Diseases of the respiratory system complicating pregnancy, second trimester: Secondary | ICD-10-CM

## 2017-06-29 NOTE — Patient Instructions (Signed)

## 2017-06-29 NOTE — Progress Notes (Signed)
   PRENATAL VISIT NOTE  Subjective:  Megan Hunt is a 32 y.o. G4P3003 at 5818w1d being seen today for ongoing prenatal care.  She is currently monitored for the following issues for this high-risk pregnancy and has HYPERCHOLESTEROLEMIA; ANXIETY DEPRESSION; Essential hypertension; Allergic rhinitis; Asthma, mild intermittent; Constipation; Backache; SOMATIC DYSFUNCTION; CHEST PAIN, ATYPICAL; Migraine; Hemorrhoids, external; Fibromyalgia; Sinusitis, acute maxillary; Fatigue; Insomnia; Supervision of other normal pregnancy, antepartum; Depression affecting pregnancy, antepartum; Asthma affecting pregnancy, antepartum; and Benign essential hypertension affecting pregnancy, antepartum on their problem list.  Patient reports pt c/o pain in her pelvis over the pubic bone. .  Contractions: Not present. Vag. Bleeding: None.  Movement: Present. Denies leaking of fluid.   The following portions of the patient's history were reviewed and updated as appropriate: allergies, current medications, past family history, past medical history, past social history, past surgical history and problem list. Problem list updated.  Objective:   Vitals:   06/29/17 1539  BP: (!) 145/79  Pulse: 87  Weight: 201 lb (91.2 kg)    Fetal Status: Fetal Heart Rate (bpm): 145   Movement: Present     General:  Alert, oriented and cooperative. Patient is in no acute distress.  Skin: Skin is warm and dry. No rash noted.   Cardiovascular: Normal heart rate noted  Respiratory: Normal respiratory effort, no problems with respiration noted  Abdomen: Soft, gravid, appropriate for gestational age.  Pain/Pressure: Present     Pelvic: Cervical exam deferred      There is tenderness to palpation over the pubic symphysis   Extremities: Normal range of motion.     Mental Status:  Normal mood and affect. Normal behavior. Normal judgment and thought content.   Assessment and Plan:  Pregnancy: G4P3003 at 4118w1d  1. Supervision of other  normal pregnancy, antepartum Need f/u US to measure fetal head - US MFM OB FOLLOW UP; Future  2. Depression affecting pregnancy, antepartum  3. Asthma affecting pregnancy, antepartum  4. ANXIETY DEPRESSION  5. Non-seasonal allergic rhinitis due to pollen Pt c/o nasal congestion. OK to use Mucinex   6. Pubic symphysis pain rec abd binder prn  7. HTN Rec baby ASA. Pt is not taking. She and her spouse report that they will pick it up today     Preterm labor symptoms and general obstetric precautions including but not limited to vaginal bleeding, contractions, leaking of fluid and fetal movement were reviewed in detail with the patient. Please refer to After Visit Summary for other counseling recommendations.  Return in about 4 weeks (around 07/27/2017).   Willodean Rosenthalarolyn Harraway-Smith, MD

## 2017-07-08 ENCOUNTER — Ambulatory Visit (HOSPITAL_COMMUNITY)
Admission: RE | Admit: 2017-07-08 | Discharge: 2017-07-08 | Disposition: A | Discharge status: Home / Self Care | Payer: 59 | Source: Ambulatory Visit | Attending: Obstetrics & Gynecology | Admitting: Obstetrics & Gynecology

## 2017-07-08 ENCOUNTER — Other Ambulatory Visit: Payer: Self-pay | Admitting: Obstetrics & Gynecology

## 2017-07-08 DIAGNOSIS — O10012 Pre-existing essential hypertension complicating pregnancy, second trimester: Secondary | ICD-10-CM | POA: Diagnosis not present

## 2017-07-08 DIAGNOSIS — Z362 Encounter for other antenatal screening follow-up: Secondary | ICD-10-CM | POA: Insufficient documentation

## 2017-07-08 DIAGNOSIS — Z3480 Encounter for supervision of other normal pregnancy, unspecified trimester: Secondary | ICD-10-CM

## 2017-07-08 DIAGNOSIS — Z3A23 23 weeks gestation of pregnancy: Secondary | ICD-10-CM | POA: Diagnosis not present

## 2017-07-08 DIAGNOSIS — O10912 Unspecified pre-existing hypertension complicating pregnancy, second trimester: Secondary | ICD-10-CM | POA: Diagnosis not present

## 2017-07-08 DIAGNOSIS — O10919 Unspecified pre-existing hypertension complicating pregnancy, unspecified trimester: Secondary | ICD-10-CM

## 2017-07-27 ENCOUNTER — Encounter: Payer: 59 | Admitting: Obstetrics & Gynecology

## 2017-07-28 ENCOUNTER — Encounter: Payer: Self-pay | Admitting: Obstetrics & Gynecology

## 2017-07-28 ENCOUNTER — Ambulatory Visit (INDEPENDENT_AMBULATORY_CARE_PROVIDER_SITE_OTHER): Payer: 59 | Admitting: Family Medicine

## 2017-07-28 VITALS — BP 150/75 | HR 91 | Wt 204.0 lb

## 2017-07-28 DIAGNOSIS — Z3480 Encounter for supervision of other normal pregnancy, unspecified trimester: Secondary | ICD-10-CM | POA: Diagnosis not present

## 2017-07-28 DIAGNOSIS — O10019 Pre-existing essential hypertension complicating pregnancy, unspecified trimester: Secondary | ICD-10-CM

## 2017-07-28 NOTE — Progress Notes (Signed)
Patient will do her glucose testing today. Patient complaining of hip and leg pain. Armandina StammerJennifer Howard RNBSN

## 2017-07-28 NOTE — Progress Notes (Signed)
   PRENATAL VISIT NOTE  Subjective:  Mariadel Genene ChurnM Mccreadie is a 32 y.o. [phone] at 1533w2d being seen today for ongoing prenatal care.  She is currently monitored for the following issues for this high-risk pregnancy and has HYPERCHOLESTEROLEMIA; ANXIETY DEPRESSION; Essential hypertension; Allergic rhinitis; Asthma, mild intermittent; Constipation; Backache; SOMATIC DYSFUNCTION; CHEST PAIN, ATYPICAL; Migraine; Hemorrhoids, external; Fibromyalgia; Sinusitis, acute maxillary; Fatigue; Insomnia; Supervision of other normal pregnancy, antepartum; Depression affecting pregnancy, antepartum; Asthma affecting pregnancy, antepartum; and Benign essential hypertension affecting pregnancy, antepartum on their problem list.  Patient reports pain in back of leg.  Contractions: Not present. Vag. Bleeding: None.  Movement: Present. Denies leaking of fluid.   The following portions of the patient's history were reviewed and updated as appropriate: allergies, current medications, past family history, past medical history, past social history, past surgical history and problem list. Problem list updated.  Objective:   Vitals:   07/28/17 0830  BP: (!) 150/75  Pulse: 91  Weight: 204 lb (92.5 kg)    Fetal Status: Fetal Heart Rate (bpm): 154   Movement: Present     General:  Alert, oriented and cooperative. Patient is in no acute distress.  Skin: Skin is warm and dry. No rash noted.   Cardiovascular: Normal heart rate noted  Respiratory: Normal respiratory effort, no problems with respiration noted  Abdomen: Soft, gravid, appropriate for gestational age.  Pain/Pressure: Present     Pelvic: Cervical exam deferred        Extremities: Normal range of motion.  Edema: None  Mental Status:  Normal mood and affect. Normal behavior. Normal judgment and thought content.   Assessment and Plan:  Pregnancy: G4P3003 at 4633w2d  1. Supervision of other normal pregnancy, antepartum FHT and FH normal. Discussed contraception.  DIscussed LARC vs PP BTL - Glucose Tolerance, 2 Hours w/1 Hour - RPR - CBC - HIV antibody (with reflex) - US MFM OB FOLLOW UP; Future  2. Benign essential hypertension affecting pregnancy, antepartum F?U US next week Antenatal testing at 32 weeks. Delivery between 39 and 40 wks. - US MFM OB FOLLOW UP; Future  Preterm labor symptoms and general obstetric precautions including but not limited to vaginal bleeding, contractions, leaking of fluid and fetal movement were reviewed in detail with the patient. Please refer to After Visit Summary for other counseling recommendations.  Return in about 1 month (around 08/25/2017).   Levie HeritageJacob J Stinson, DO

## 2017-07-29 ENCOUNTER — Encounter: Payer: Self-pay | Admitting: Family Medicine

## 2017-07-29 LAB — GLUCOSE TOLERANCE, 2 HOURS W/ 1HR
GLUCOSE, 2 HOUR: 118 mg/dL (ref 65–152)
Glucose, 1 hour: 118 mg/dL (ref 65–179)
Glucose, Fasting: 78 mg/dL (ref 65–91)

## 2017-07-29 LAB — RPR: RPR: NONREACTIVE

## 2017-07-29 LAB — HIV ANTIBODY (ROUTINE TESTING W REFLEX): HIV SCREEN 4TH GENERATION: NONREACTIVE

## 2017-07-29 LAB — CBC
HEMOGLOBIN: 11 g/dL — AB (ref 11.1–15.9)
Hematocrit: 32.8 % — ABNORMAL LOW (ref 34.0–46.6)
MCH: 27.5 pg (ref 26.6–33.0)
MCHC: 33.5 g/dL (ref 31.5–35.7)
MCV: 82 fL (ref 79–97)
PLATELETS: 216 10*3/uL (ref 150–379)
RBC: 4 x10E6/uL (ref 3.77–5.28)
RDW: 14.6 % (ref 12.3–15.4)
WBC: 12.4 10*3/uL — ABNORMAL HIGH (ref 3.4–10.8)

## 2017-08-03 ENCOUNTER — Encounter: Payer: Self-pay | Admitting: Family Medicine

## 2017-08-10 ENCOUNTER — Ambulatory Visit (HOSPITAL_COMMUNITY)
Admission: RE | Admit: 2017-08-10 | Discharge: 2017-08-10 | Disposition: A | Discharge status: Home / Self Care | Payer: 59 | Source: Ambulatory Visit | Attending: Family Medicine | Admitting: Family Medicine

## 2017-08-10 ENCOUNTER — Other Ambulatory Visit: Payer: Self-pay | Admitting: Family Medicine

## 2017-08-10 DIAGNOSIS — Z3480 Encounter for supervision of other normal pregnancy, unspecified trimester: Secondary | ICD-10-CM

## 2017-08-10 DIAGNOSIS — Z362 Encounter for other antenatal screening follow-up: Secondary | ICD-10-CM | POA: Insufficient documentation

## 2017-08-10 DIAGNOSIS — O10013 Pre-existing essential hypertension complicating pregnancy, third trimester: Secondary | ICD-10-CM | POA: Insufficient documentation

## 2017-08-10 DIAGNOSIS — Z3A28 28 weeks gestation of pregnancy: Secondary | ICD-10-CM | POA: Diagnosis not present

## 2017-08-10 DIAGNOSIS — O0932 Supervision of pregnancy with insufficient antenatal care, second trimester: Secondary | ICD-10-CM | POA: Diagnosis not present

## 2017-08-10 DIAGNOSIS — O10019 Pre-existing essential hypertension complicating pregnancy, unspecified trimester: Secondary | ICD-10-CM

## 2017-08-16 ENCOUNTER — Encounter: Payer: 59 | Admitting: Family Medicine

## 2017-08-31 ENCOUNTER — Ambulatory Visit (INDEPENDENT_AMBULATORY_CARE_PROVIDER_SITE_OTHER): Payer: 59 | Admitting: Obstetrics & Gynecology

## 2017-08-31 VITALS — BP 134/63 | HR 88 | Wt 207.0 lb

## 2017-08-31 DIAGNOSIS — K644 Residual hemorrhoidal skin tags: Secondary | ICD-10-CM

## 2017-08-31 DIAGNOSIS — O99340 Other mental disorders complicating pregnancy, unspecified trimester: Secondary | ICD-10-CM

## 2017-08-31 DIAGNOSIS — Z3480 Encounter for supervision of other normal pregnancy, unspecified trimester: Secondary | ICD-10-CM

## 2017-08-31 DIAGNOSIS — F32A Depression, unspecified: Secondary | ICD-10-CM

## 2017-08-31 DIAGNOSIS — O10019 Pre-existing essential hypertension complicating pregnancy, unspecified trimester: Secondary | ICD-10-CM

## 2017-08-31 DIAGNOSIS — F329 Major depressive disorder, single episode, unspecified: Secondary | ICD-10-CM

## 2017-08-31 NOTE — Patient Instructions (Signed)

## 2017-08-31 NOTE — Progress Notes (Signed)
   PRENATAL VISIT NOTE  Subjective:  Michaele Genene ChurnM Lyness is a 32 y.o. [phone] at [redacted]w[redacted]d being seen today for ongoing prenatal care.  She is currently monitored for the following issues for this high-risk pregnancy and has HYPERCHOLESTEROLEMIA; ANXIETY DEPRESSION; Essential hypertension; Allergic rhinitis; Asthma, mild intermittent; Constipation; Backache; SOMATIC DYSFUNCTION; CHEST PAIN, ATYPICAL; Migraine; Hemorrhoids, external; Fibromyalgia; Sinusitis, acute maxillary; Fatigue; Insomnia; Supervision of other normal pregnancy, antepartum; Depression affecting pregnancy, antepartum; Asthma affecting pregnancy, antepartum; and Benign essential hypertension affecting pregnancy, antepartum on their problem list.  Patient reports no complaints.  Contractions: Not present. Vag. Bleeding: None.  Movement: Present. Denies leaking of fluid.   The following portions of the patient's history were reviewed and updated as appropriate: allergies, current medications, past family history, past medical history, past social history, past surgical history and problem list. Problem list updated.  Objective:   Vitals:   08/31/17 1531  BP: 134/63  Pulse: 88  Weight: 207 lb (93.9 kg)    Fetal Status:     Movement: Present     General:  Alert, oriented and cooperative. Patient is in no acute distress.  Skin: Skin is warm and dry. No rash noted.   Cardiovascular: Normal heart rate noted  Respiratory: Normal respiratory effort, no problems with respiration noted  Abdomen: Soft, gravid, appropriate for gestational age.  Pain/Pressure: Present     Pelvic: Cervical exam deferred        Extremities: Normal range of motion.  Edema: None  Mental Status: Normal mood and affect. Normal behavior. Normal judgment and thought content.   Assessment and Plan:  Pregnancy: G4P3003 at [redacted]w[redacted]d  1. Supervision of other normal pregnancy, antepartum Swelling- d/w pt limiting sodium  Begin antenatal testing next visit  2.  Hemorrhoids, external  3. Benign essential hypertension affecting pregnancy, antepartum  4. Depression affecting pregnancy, antepartum  Preterm labor symptoms and general obstetric precautions including but not limited to vaginal bleeding, contractions, leaking of fluid and fetal movement were reviewed in detail with the patient. Please refer to After Visit Summary for other counseling recommendations.  Return in about 2 weeks (around 09/14/2017).  Future Appointments  Date Time Provider Department Center  11/24/2017  1:30 PM Bradd CanaryBlyth, Stacey A, MD [facility]    Willodean Rosenthalarolyn Harraway-Smith, MD

## 2017-09-08 ENCOUNTER — Ambulatory Visit (INDEPENDENT_AMBULATORY_CARE_PROVIDER_SITE_OTHER): Payer: 59

## 2017-09-08 ENCOUNTER — Ambulatory Visit (HOSPITAL_COMMUNITY): Payer: 59

## 2017-09-08 VITALS — BP 144/79 | HR 80

## 2017-09-08 DIAGNOSIS — O1013 Pre-existing hypertensive heart disease complicating the puerperium: Secondary | ICD-10-CM

## 2017-09-08 DIAGNOSIS — O10919 Unspecified pre-existing hypertension complicating pregnancy, unspecified trimester: Secondary | ICD-10-CM

## 2017-09-08 NOTE — Progress Notes (Signed)
NST only visit. Patient to go for follow ultrasound on Monday. Added BPP order so she can just go to MFM on Monday May 6th. Megan Hunt

## 2017-09-12 ENCOUNTER — Ambulatory Visit (HOSPITAL_COMMUNITY)
Admission: RE | Admit: 2017-09-12 | Discharge: 2017-09-12 | Disposition: A | Discharge status: Home / Self Care | Payer: 59 | Source: Ambulatory Visit | Attending: Obstetrics & Gynecology | Admitting: Obstetrics & Gynecology

## 2017-09-12 ENCOUNTER — Other Ambulatory Visit: Payer: Self-pay | Admitting: Family Medicine

## 2017-09-12 ENCOUNTER — Other Ambulatory Visit: Payer: Self-pay | Admitting: Obstetrics & Gynecology

## 2017-09-12 ENCOUNTER — Other Ambulatory Visit: Payer: 59

## 2017-09-12 DIAGNOSIS — Z362 Encounter for other antenatal screening follow-up: Secondary | ICD-10-CM | POA: Diagnosis not present

## 2017-09-12 DIAGNOSIS — O0933 Supervision of pregnancy with insufficient antenatal care, third trimester: Secondary | ICD-10-CM

## 2017-09-12 DIAGNOSIS — O10013 Pre-existing essential hypertension complicating pregnancy, third trimester: Secondary | ICD-10-CM | POA: Insufficient documentation

## 2017-09-12 DIAGNOSIS — Z3A32 32 weeks gestation of pregnancy: Secondary | ICD-10-CM | POA: Insufficient documentation

## 2017-09-12 DIAGNOSIS — O10019 Pre-existing essential hypertension complicating pregnancy, unspecified trimester: Secondary | ICD-10-CM

## 2017-09-12 DIAGNOSIS — Z3480 Encounter for supervision of other normal pregnancy, unspecified trimester: Secondary | ICD-10-CM

## 2017-09-12 DIAGNOSIS — O10919 Unspecified pre-existing hypertension complicating pregnancy, unspecified trimester: Secondary | ICD-10-CM

## 2017-09-15 ENCOUNTER — Ambulatory Visit (INDEPENDENT_AMBULATORY_CARE_PROVIDER_SITE_OTHER): Payer: 59 | Admitting: Obstetrics & Gynecology

## 2017-09-15 VITALS — BP 131/76 | HR 85

## 2017-09-15 DIAGNOSIS — Z3483 Encounter for supervision of other normal pregnancy, third trimester: Secondary | ICD-10-CM

## 2017-09-15 DIAGNOSIS — Z3480 Encounter for supervision of other normal pregnancy, unspecified trimester: Secondary | ICD-10-CM

## 2017-09-15 DIAGNOSIS — F329 Major depressive disorder, single episode, unspecified: Secondary | ICD-10-CM

## 2017-09-15 DIAGNOSIS — G43009 Migraine without aura, not intractable, without status migrainosus: Secondary | ICD-10-CM

## 2017-09-15 DIAGNOSIS — O10013 Pre-existing essential hypertension complicating pregnancy, third trimester: Secondary | ICD-10-CM

## 2017-09-15 DIAGNOSIS — F32A Depression, unspecified: Secondary | ICD-10-CM

## 2017-09-15 DIAGNOSIS — O99340 Other mental disorders complicating pregnancy, unspecified trimester: Secondary | ICD-10-CM

## 2017-09-15 DIAGNOSIS — M797 Fibromyalgia: Secondary | ICD-10-CM

## 2017-09-15 DIAGNOSIS — E7800 Pure hypercholesterolemia, unspecified: Secondary | ICD-10-CM

## 2017-09-15 DIAGNOSIS — O99343 Other mental disorders complicating pregnancy, third trimester: Secondary | ICD-10-CM

## 2017-09-15 DIAGNOSIS — O10019 Pre-existing essential hypertension complicating pregnancy, unspecified trimester: Secondary | ICD-10-CM

## 2017-09-15 NOTE — Progress Notes (Signed)
Pt c/o headaches and visual changes. Pt also c/o  Increased vaginal pressure

## 2017-09-15 NOTE — Progress Notes (Signed)
   PRENATAL VISIT NOTE  Subjective:  Megan Hunt is a 32 y.o. [phone] at [redacted]w[redacted]d being seen today for ongoing prenatal care.  She is currently monitored for the following issues for this high-risk pregnancy and has HYPERCHOLESTEROLEMIA; ANXIETY DEPRESSION; Essential hypertension; Allergic rhinitis; Asthma, mild intermittent; Constipation; Backache; SOMATIC DYSFUNCTION; CHEST PAIN, ATYPICAL; Migraine; Hemorrhoids, external; Fibromyalgia; Sinusitis, acute maxillary; Fatigue; Insomnia; Supervision of other normal pregnancy, antepartum; Depression affecting pregnancy, antepartum; Asthma affecting pregnancy, antepartum; and Benign essential hypertension affecting pregnancy, antepartum on their problem list.  Patient reports lower ext edema.  Contractions: Not present. Vag. Bleeding: None.  Movement: Present. Denies leaking of fluid.   The following portions of the patient's history were reviewed and updated as appropriate: allergies, current medications, past family history, past medical history, past social history, past surgical history and problem list. Problem list updated.  Objective:  There were no vitals filed for this visit.  Fetal Status:     Movement: Present     General:  Alert, oriented and cooperative. Patient is in no acute distress.  Skin: Skin is warm and dry. No rash noted.   Cardiovascular: Normal heart rate noted  Respiratory: Normal respiratory effort, no problems with respiration noted  Abdomen: Soft, gravid, appropriate for gestational age.  Pain/Pressure: Present     Pelvic: Cervical exam deferred        Extremities: Normal range of motion.  Edema: Trace  Mental Status: Normal mood and affect. Normal behavior. Normal judgment and thought content.   Assessment and Plan:  Pregnancy: G4P3003 at [redacted]w[redacted]d  1. Supervision of other normal pregnancy, antepartum NST reviewed and reactive.  2. 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  HYPERCHOLESTEROLEMIA  4. Benign essential hypertension affecting pregnancy, antepartum  5. Depression affecting pregnancy, antepartum  6. Fibromyalgia  Preterm labor symptoms and general obstetric precautions including but not limited to vaginal bleeding, contractions, leaking of fluid and fetal movement were reviewed in detail with the patient. Please refer to After Visit Summary for other counseling recommendations.  Return in about 2 weeks (around 09/29/2017).  Future Appointments  Date Time Provider Department Center  09/19/2017  9:30 AM [facility] NURSE [facility] None  09/22/2017  9:30 AM Levie Heritage, DO [facility] None  09/26/2017  9:30 AM [facility] NURSE [facility] None  09/29/2017  9:15 AM Willodean Rosenthal, MD [facility] None  11/24/2017  1:30 PM Bradd Canary, MD [facility]    Willodean Rosenthal, MD

## 2017-09-19 ENCOUNTER — Ambulatory Visit (INDEPENDENT_AMBULATORY_CARE_PROVIDER_SITE_OTHER): Payer: 59

## 2017-09-19 VITALS — BP 135/74 | HR 91

## 2017-09-19 DIAGNOSIS — O10013 Pre-existing essential hypertension complicating pregnancy, third trimester: Secondary | ICD-10-CM | POA: Diagnosis not present

## 2017-09-19 DIAGNOSIS — O10019 Pre-existing essential hypertension complicating pregnancy, unspecified trimester: Secondary | ICD-10-CM

## 2017-09-19 NOTE — Progress Notes (Signed)
NST only visit at 33.6weeks Armandina Stammer RN

## 2017-09-22 ENCOUNTER — Ambulatory Visit (INDEPENDENT_AMBULATORY_CARE_PROVIDER_SITE_OTHER): Payer: 59 | Admitting: Family Medicine

## 2017-09-22 VITALS — BP 138/76 | HR 93 | Wt 211.0 lb

## 2017-09-22 DIAGNOSIS — Z3480 Encounter for supervision of other normal pregnancy, unspecified trimester: Secondary | ICD-10-CM

## 2017-09-22 DIAGNOSIS — O10013 Pre-existing essential hypertension complicating pregnancy, third trimester: Secondary | ICD-10-CM

## 2017-09-22 DIAGNOSIS — O10019 Pre-existing essential hypertension complicating pregnancy, unspecified trimester: Secondary | ICD-10-CM

## 2017-09-22 NOTE — Progress Notes (Signed)
Repeat BP:138/76

## 2017-09-22 NOTE — Progress Notes (Signed)
   PRENATAL VISIT NOTE  Subjective:  Megan Hunt is a 32 y.o. [phone] at [redacted]w[redacted]d being seen today for ongoing prenatal care.  She is currently monitored for the following issues for this high-risk pregnancy and has HYPERCHOLESTEROLEMIA; ANXIETY DEPRESSION; Essential hypertension; Allergic rhinitis; Asthma, mild intermittent; Constipation; Backache; SOMATIC DYSFUNCTION; CHEST PAIN, ATYPICAL; Migraine; Hemorrhoids, external; Fibromyalgia; Sinusitis, acute maxillary; Fatigue; Insomnia; Supervision of other normal pregnancy, antepartum; Depression affecting pregnancy, antepartum; Asthma affecting pregnancy, antepartum; and Benign essential hypertension affecting pregnancy, antepartum on their problem list.  Patient reports no complaints.  Contractions: Not present. Vag. Bleeding: None.  Movement: Present. Denies leaking of fluid.   The following portions of the patient's history were reviewed and updated as appropriate: allergies, current medications, past family history, past medical history, past social history, past surgical history and problem list. Problem list updated.  Objective:   Vitals:   09/22/17 0940 09/22/17 1014  BP: (!) 147/74 138/76  Pulse: 98 93  Weight: 211 lb (95.7 kg)     Fetal Status: Fetal Heart Rate (bpm): NST-R Fundal Height: 35 cm Movement: Present     General:  Alert, oriented and cooperative. Patient is in no acute distress.  Skin: Skin is warm and dry. No rash noted.   Cardiovascular: Normal heart rate noted  Respiratory: Normal respiratory effort, no problems with respiration noted  Abdomen: Soft, gravid, appropriate for gestational age.  Pain/Pressure: Present     Pelvic: Cervical exam deferred        Extremities: Normal range of motion.  Edema: Trace  Mental Status: Normal mood and affect. Normal behavior. Normal judgment and thought content.   Assessment and Plan:  Pregnancy: G4P3003 at [redacted]w[redacted]d  1. Supervision of other normal pregnancy, antepartum FHT and  FH normal  2. Benign essential hypertension affecting pregnancy, antepartum NST reactive - [place] MFM OB FOLLOW UP; Future  Preterm labor symptoms and general obstetric precautions including but not limited to vaginal bleeding, contractions, leaking of fluid and fetal movement were reviewed in detail with the patient. Please refer to After Visit Summary for other counseling recommendations.  No follow-ups on file.  Future Appointments  Date Time Provider Department Center  09/26/2017  9:30 AM [facility] NURSE [facility] None  09/29/2017  9:15 AM Willodean Rosenthal, MD [facility] None  10/05/2017  3:45 PM [facility] [place] 2 [facility]  11/24/2017  1:30 PM Bradd Canary, MD [facility]    Levie Heritage, DO

## 2017-09-26 ENCOUNTER — Ambulatory Visit (INDEPENDENT_AMBULATORY_CARE_PROVIDER_SITE_OTHER): Payer: 59

## 2017-09-26 VITALS — BP 132/74 | HR 92

## 2017-09-26 DIAGNOSIS — O10913 Unspecified pre-existing hypertension complicating pregnancy, third trimester: Secondary | ICD-10-CM

## 2017-09-26 DIAGNOSIS — O10019 Pre-existing essential hypertension complicating pregnancy, unspecified trimester: Secondary | ICD-10-CM

## 2017-09-26 NOTE — Progress Notes (Addendum)
NST only visit. Armandina Stammer RN  NST reviewed and reactive.  Carolyn L. Harraway-Smith, M.D., Evern Core

## 2017-09-29 ENCOUNTER — Ambulatory Visit (INDEPENDENT_AMBULATORY_CARE_PROVIDER_SITE_OTHER): Payer: 59 | Admitting: Obstetrics & Gynecology

## 2017-09-29 VITALS — BP 131/73 | HR 97 | Wt 210.0 lb

## 2017-09-29 DIAGNOSIS — O99340 Other mental disorders complicating pregnancy, unspecified trimester: Secondary | ICD-10-CM

## 2017-09-29 DIAGNOSIS — F329 Major depressive disorder, single episode, unspecified: Secondary | ICD-10-CM

## 2017-09-29 DIAGNOSIS — O10013 Pre-existing essential hypertension complicating pregnancy, third trimester: Secondary | ICD-10-CM | POA: Diagnosis not present

## 2017-09-29 DIAGNOSIS — I10 Essential (primary) hypertension: Secondary | ICD-10-CM

## 2017-09-29 DIAGNOSIS — O99343 Other mental disorders complicating pregnancy, third trimester: Secondary | ICD-10-CM | POA: Diagnosis not present

## 2017-09-29 DIAGNOSIS — Z3480 Encounter for supervision of other normal pregnancy, unspecified trimester: Secondary | ICD-10-CM

## 2017-09-29 DIAGNOSIS — O10019 Pre-existing essential hypertension complicating pregnancy, unspecified trimester: Secondary | ICD-10-CM

## 2017-09-29 DIAGNOSIS — F32A Depression, unspecified: Secondary | ICD-10-CM

## 2017-09-29 NOTE — Progress Notes (Signed)
   PRENATAL VISIT NOTE  Subjective:  Megan Hunt is a 32 y.o. [phone] at [redacted]w[redacted]d being seen today for ongoing prenatal care.  She is currently monitored for the following issues for this high-risk pregnancy and has HYPERCHOLESTEROLEMIA; ANXIETY DEPRESSION; Essential hypertension; Allergic rhinitis; Asthma, mild intermittent; Constipation; Backache; SOMATIC DYSFUNCTION; CHEST PAIN, ATYPICAL; Migraine; Hemorrhoids, external; Fibromyalgia; Sinusitis, acute maxillary; Fatigue; Insomnia; Supervision of other normal pregnancy, antepartum; Depression affecting pregnancy, antepartum; Asthma affecting pregnancy, antepartum; and Benign essential hypertension affecting pregnancy, antepartum on their problem list.  Patient reports no complaints.  Contractions: Not present. Vag. Bleeding: None.  Movement: Present. Denies leaking of fluid.   The following portions of the patient's history were reviewed and updated as appropriate: allergies, current medications, past family history, past medical history, past social history, past surgical history and problem list. Problem list updated.  Objective:   Vitals:   09/29/17 0921  BP: 131/73  Pulse: 97  Weight: 210 lb (95.3 kg)    Fetal Status: Fetal Heart Rate (bpm): NST   Movement: Present     General:  Alert, oriented and cooperative. Patient is in no acute distress.  Skin: Skin is warm and dry. No rash noted.   Cardiovascular: Normal heart rate noted  Respiratory: Normal respiratory effort, no problems with respiration noted  Abdomen: Soft, gravid, appropriate for gestational age.  Pain/Pressure: Present     Pelvic: Cervical exam deferred        Extremities: Normal range of motion.  Edema: Trace  Mental Status: Normal mood and affect. Normal behavior. Normal judgment and thought content.   Assessment and Plan:  Pregnancy: G4P3003 at [redacted]w[redacted]d  1. Supervision of other normal pregnancy, antepartum NST reviewed and reactive.  2. Essential  hypertension  3. Depression affecting pregnancy, antepartum stable 4. Benign essential hypertension affecting pregnancy, antepartum Stopped taking baby ASA. Will restart and stop at 36 weeks.   Preterm labor symptoms and general obstetric precautions including but not limited to vaginal bleeding, contractions, leaking of fluid and fetal movement were reviewed in detail with the patient. Please refer to After Visit Summary for other counseling recommendations.  Return in about 1 week (around 10/06/2017).  Future Appointments  Date Time Provider Department Center  10/05/2017  3:45 PM [facility] [place] 2 [facility]  11/24/2017  1:30 PM Bradd Canary, MD [facility]    Willodean Rosenthal, MD

## 2017-10-03 ENCOUNTER — Encounter: Payer: Self-pay | Admitting: Obstetrics & Gynecology

## 2017-10-05 ENCOUNTER — Ambulatory Visit (HOSPITAL_COMMUNITY)
Admission: RE | Admit: 2017-10-05 | Discharge: 2017-10-05 | Disposition: A | Discharge status: Home / Self Care | Payer: 59 | Source: Ambulatory Visit | Attending: Family Medicine | Admitting: Family Medicine

## 2017-10-05 DIAGNOSIS — Z3A36 36 weeks gestation of pregnancy: Secondary | ICD-10-CM | POA: Diagnosis not present

## 2017-10-05 DIAGNOSIS — O10013 Pre-existing essential hypertension complicating pregnancy, third trimester: Secondary | ICD-10-CM | POA: Insufficient documentation

## 2017-10-05 DIAGNOSIS — O10019 Pre-existing essential hypertension complicating pregnancy, unspecified trimester: Secondary | ICD-10-CM | POA: Diagnosis not present

## 2017-10-06 ENCOUNTER — Encounter (HOSPITAL_COMMUNITY): Payer: Self-pay

## 2017-10-06 ENCOUNTER — Ambulatory Visit (INDEPENDENT_AMBULATORY_CARE_PROVIDER_SITE_OTHER): Payer: 59 | Admitting: Family Medicine

## 2017-10-06 ENCOUNTER — Inpatient Hospital Stay (HOSPITAL_COMMUNITY)
Admission: AD | Admit: 2017-10-06 | Discharge: 2017-10-06 | Disposition: A | Discharge status: Home / Self Care | Payer: 59 | Source: Ambulatory Visit | Attending: Obstetrics and Gynecology | Admitting: Obstetrics and Gynecology

## 2017-10-06 VITALS — BP 146/78 | HR 99 | Wt 210.0 lb

## 2017-10-06 DIAGNOSIS — Z8261 Family history of arthritis: Secondary | ICD-10-CM | POA: Insufficient documentation

## 2017-10-06 DIAGNOSIS — O10013 Pre-existing essential hypertension complicating pregnancy, third trimester: Secondary | ICD-10-CM | POA: Insufficient documentation

## 2017-10-06 DIAGNOSIS — O10019 Pre-existing essential hypertension complicating pregnancy, unspecified trimester: Secondary | ICD-10-CM

## 2017-10-06 DIAGNOSIS — O99340 Other mental disorders complicating pregnancy, unspecified trimester: Secondary | ICD-10-CM

## 2017-10-06 DIAGNOSIS — Z3A36 36 weeks gestation of pregnancy: Secondary | ICD-10-CM | POA: Diagnosis not present

## 2017-10-06 DIAGNOSIS — O99343 Other mental disorders complicating pregnancy, third trimester: Secondary | ICD-10-CM

## 2017-10-06 DIAGNOSIS — F329 Major depressive disorder, single episode, unspecified: Secondary | ICD-10-CM | POA: Insufficient documentation

## 2017-10-06 DIAGNOSIS — Z3483 Encounter for supervision of other normal pregnancy, third trimester: Secondary | ICD-10-CM

## 2017-10-06 DIAGNOSIS — Z3480 Encounter for supervision of other normal pregnancy, unspecified trimester: Secondary | ICD-10-CM

## 2017-10-06 DIAGNOSIS — Z113 Encounter for screening for infections with a predominantly sexual mode of transmission: Secondary | ICD-10-CM | POA: Diagnosis not present

## 2017-10-06 DIAGNOSIS — J45909 Unspecified asthma, uncomplicated: Secondary | ICD-10-CM | POA: Diagnosis not present

## 2017-10-06 DIAGNOSIS — F32A Depression, unspecified: Secondary | ICD-10-CM

## 2017-10-06 DIAGNOSIS — Z8249 Family history of ischemic heart disease and other diseases of the circulatory system: Secondary | ICD-10-CM | POA: Insufficient documentation

## 2017-10-06 DIAGNOSIS — O99519 Diseases of the respiratory system complicating pregnancy, unspecified trimester: Secondary | ICD-10-CM

## 2017-10-06 DIAGNOSIS — O10913 Unspecified pre-existing hypertension complicating pregnancy, third trimester: Secondary | ICD-10-CM | POA: Diagnosis not present

## 2017-10-06 DIAGNOSIS — Z3490 Encounter for supervision of normal pregnancy, unspecified, unspecified trimester: Secondary | ICD-10-CM

## 2017-10-06 DIAGNOSIS — O99513 Diseases of the respiratory system complicating pregnancy, third trimester: Secondary | ICD-10-CM | POA: Diagnosis not present

## 2017-10-06 DIAGNOSIS — I10 Essential (primary) hypertension: Secondary | ICD-10-CM

## 2017-10-06 DIAGNOSIS — Z7982 Long term (current) use of aspirin: Secondary | ICD-10-CM | POA: Insufficient documentation

## 2017-10-06 LAB — URINALYSIS, ROUTINE W REFLEX MICROSCOPIC
BILIRUBIN URINE: NEGATIVE
Bacteria, UA: NONE SEEN
Glucose, UA: NEGATIVE mg/dL
HGB URINE DIPSTICK: NEGATIVE
Ketones, ur: NEGATIVE mg/dL
NITRITE: NEGATIVE
PH: 7 (ref 5.0–8.0)
Protein, ur: NEGATIVE mg/dL
SPECIFIC GRAVITY, URINE: 1.015 (ref 1.005–1.030)

## 2017-10-06 LAB — PROTEIN / CREATININE RATIO, URINE
Creatinine, Urine: 180 mg/dL
Protein Creatinine Ratio: 0.07 mg/mg{Cre} (ref 0.00–0.15)
Total Protein, Urine: 12 mg/dL

## 2017-10-06 LAB — CBC
HCT: 33.7 % — ABNORMAL LOW (ref 36.0–46.0)
Hemoglobin: 11 g/dL — ABNORMAL LOW (ref 12.0–15.0)
MCH: 27.4 pg (ref 26.0–34.0)
MCHC: 32.6 g/dL (ref 30.0–36.0)
MCV: 83.8 fL (ref 78.0–100.0)
Platelets: 223 10*3/uL (ref 150–400)
RBC: 4.02 MIL/uL (ref 3.87–5.11)
RDW: 13.4 % (ref 11.5–15.5)
WBC: 12.8 10*3/uL — ABNORMAL HIGH (ref 4.0–10.5)

## 2017-10-06 LAB — COMPREHENSIVE METABOLIC PANEL
ALT: 15 U/L (ref 14–54)
ANION GAP: 9 (ref 5–15)
AST: 17 U/L (ref 15–41)
Albumin: 3.3 g/dL — ABNORMAL LOW (ref 3.5–5.0)
Alkaline Phosphatase: 302 U/L — ABNORMAL HIGH (ref 38–126)
BILIRUBIN TOTAL: 0.3 mg/dL (ref 0.3–1.2)
BUN: 7 mg/dL (ref 6–20)
CHLORIDE: 107 mmol/L (ref 101–111)
CO2: 19 mmol/L — ABNORMAL LOW (ref 22–32)
Calcium: 8.8 mg/dL — ABNORMAL LOW (ref 8.9–10.3)
Creatinine, Ser: 0.59 mg/dL (ref 0.44–1.00)
GFR calc Af Amer: >60 mL/min (ref >60)
GLUCOSE: 91 mg/dL (ref 65–99)
POTASSIUM: 4.1 mmol/L (ref 3.5–5.1)
SODIUM: 135 mmol/L (ref 135–145)
TOTAL PROTEIN: 7.3 g/dL (ref 6.5–8.1)

## 2017-10-06 LAB — OB RESULTS CONSOLE GBS: GBS: NEGATIVE

## 2017-10-06 MED ORDER — BETAMETHASONE SOD PHOS & ACET 6 (3-3) MG/ML IJ SUSP
12.0000 mg | INTRAMUSCULAR | Status: DC
  Administered 2017-10-06: 12 mg via INTRAMUSCULAR
  Filled 2017-10-06 (×2): qty 2

## 2017-10-06 MED ORDER — BETAMETHASONE SOD PHOS & ACET 6 (3-3) MG/ML IJ SUSP: 12.0000 mg | INTRAMUSCULAR | 0 refills | Status: DC

## 2017-10-06 MED ORDER — LACTATED RINGERS IV SOLN
INTRAVENOUS | Status: DC
  Administered 2017-10-06: 14:00:00 via INTRAVENOUS

## 2017-10-06 MED ORDER — BUTALBITAL-APAP-CAFFEINE 50-325-40 MG PO TABS
2.0000 | ORAL_TABLET | Freq: Once | ORAL | Status: AC
  Administered 2017-10-06: 2 via ORAL
  Filled 2017-10-06: qty 2

## 2017-10-06 MED ORDER — HYDRALAZINE HCL 20 MG/ML IJ SOLN: 10.0000 mg | Freq: Once | INTRAMUSCULAR | Status: DC | PRN

## 2017-10-06 MED ORDER — ACETAMINOPHEN 325 MG PO TABS
650.0000 mg | ORAL_TABLET | Freq: Once | ORAL | Status: AC
  Administered 2017-10-06: 650 mg via ORAL
  Filled 2017-10-06: qty 2

## 2017-10-06 MED ORDER — LABETALOL HCL 200 MG PO TABS: 200.0000 mg | ORAL_TABLET | Freq: Two times a day (BID) | ORAL | 0 refills | Status: DC

## 2017-10-06 MED ORDER — LABETALOL HCL 5 MG/ML IV SOLN
20.0000 mg | INTRAVENOUS | Status: DC | PRN
  Administered 2017-10-06: 20 mg via INTRAVENOUS
  Filled 2017-10-06: qty 4

## 2017-10-06 NOTE — MAU Provider Note (Addendum)
Chief Complaint:  Hypertension   None     HPI: Megan Hunt is a 32 y.o. [phone] at [redacted]w[redacted]d who presents to MAU from clinic visit for high blood pressure.  She also started having a HA and seeing black spots approximately 2 weeks ago that has gradually worsened.  Today her HA is at a pain level of 7/10 and she is currently seeing black spots.  She did not take any medication for her HA because she was concerned it would affect her baby.  She currently feels hot.  She did not eat anything yesterday because she was too busy, but she ate this morning at 7:45am and drank a cup of tea and bottle of water.  Patient will be having a baby girl.  Endorses nausea, lower back and pelvic area pain, irregular contractions (Braxton Hicks), +FM, irregular BM (last BM day before yesterday), normal vaginal discharge, occasional SOB, intermittent hand and foot swelling  Denies fever, cough, congestion, CP, RUQ pain, abd pain, vomiting, dysuria, vaginal bleeding, LOF  PMH Hypertension - Patient was on medication for hypertension 4-5 years ago with atenolol.  She has not been on medication since or during any of her pregnancies.  She takes a baby aspirin irregularly.  Anxiety - not on medication  Depression - not on medication  Allergies: none  Prior pregnancies: 1st pregnancy, term, vaginal, no complications 2nd pregnancy, term, vaginal, no complications 3rd pregnancy, term, vaginal, no complications, placenta previa resolved  Pregnancy Course:   Past Medical History: Past Medical History:  Diagnosis Date  . Allergic rhinitis   . Allergy   . Anemia   . Anxiety   . Anxiety and depression   . Asthma   . Asthma, mild intermittent 10/27/2008   Qualifier: Diagnosis of  By: Kriste Basque MD, Lonzo Cloud   . Atypical chest pain   . Back pain   . Constipation   . History of migraines   . Hypercholesteremia   . Hypertension    no longer taking meds  . Insomnia 05/15/2016  . Somatic dysfunction     Past  obstetric history: OB History  Gravida Para Term Preterm AB Living  SAB TAB Ectopic Multiple Live Births          3    # Outcome Date GA Lbr Len/2nd Weight Sex Delivery Anes PTL Lv  4 Current           3 Term 03/22/13 [redacted]w[redacted]d 18:01 / 00:24 6 lb 11.9 oz (3.06 kg) M Vag-Spont EPI  LIV     Birth Comments: WNL  2 Term 06/28/09 [redacted]w[redacted]d   F Vag-Spont   LIV  1 Term 02/18/08    [name]   LIV    Past Surgical History: Past Surgical History:  Procedure Laterality Date  . WISDOM TOOTH EXTRACTION       Family History: Family History  Problem Relation Age of Onset  . Hypertension Father   . Hypertension Mother   . Arthritis Brother   . Stroke Maternal Grandfather   . Hypertension Maternal Grandfather     Social History: Social History   Tobacco Use  . Smoking status: Never Smoker  . Smokeless tobacco: Never Used  Substance Use Topics  . Alcohol use: No  . Drug use: No    Allergies: No Known Allergies  Meds:  No medications prior to admission.    I have reviewed patient's Past Medical Hx, Surgical Hx, Family  Hx, Social Hx, medications and allergies.   ROS:  All systems reviewed and are negative for acute change except as noted in the HPI. Review of Systems  Constitutional: Negative for fever.  HENT: Negative for hearing loss and sore throat.   Eyes:       Positive for floating black spots   Respiratory: Positive for shortness of breath. Negative for cough.   Gastrointestinal: Positive for nausea. Negative for abdominal pain and vomiting.  Genitourinary: Negative for dysuria, flank pain, frequency, hematuria and urgency.  Musculoskeletal: Negative for myalgias.  Skin: Negative for itching and rash.  Neurological: Positive for headaches. Negative for dizziness.     Physical Exam   Patient Vitals for the past 24 hrs:  BP Temp Pulse Height Weight  10/06/17 1501 138/83 - 90 - -  10/06/17 1446 (!) 153/87 - 86 - -  10/06/17 1431 (!) 151/84 - 88 - -   10/06/17 1416 (!) 141/79 - 88 - -  10/06/17 1402 (!) 145/91 - 89 - -  10/06/17 1346 (!) 142/93 - 85 - -  10/06/17 1315 (!) 154/95 - 86 - -  10/06/17 1301 (!) 163/92 - 88 - -  10/06/17 1245 (!) 164/79 - 100 - -  10/06/17 1230 (!) 154/85 - 93 - -  10/06/17 1215 (!) 150/95 - 95 - -  10/06/17 1201 (!) 157/88 - 89 - -  10/06/17 1140 (!) 147/92 - 89 - -  10/06/17 1128 - 97.6 F (36.4 C) - - -  10/06/17 1126 (!) 145/88 - 85 5\' 4"  (1.626 m) 210 lb (95.3 kg)   Constitutional: Well-developed, well-nourished female in no acute distress.  Cardiovascular: normal rate and rhythm, pulses intact Respiratory: normal rate and effort.  GI: Abd soft, non-tender, gravid appropriate for gestational age.  MS: Extremities nontender, no edema, normal ROM Neurologic: Alert and oriented x 4.  GU: Neg CVAT. Pelvic Exam: Deferred Psych: normal mood and affect  Labs: Results for orders placed or performed during the hospital encounter of 10/06/17 (from the past 24 hour(s))  Urinalysis, Routine w reflex microscopic     Status: Abnormal   Collection Time: 10/06/17 11:20 AM  Result Value Ref Range   Color, Urine YELLOW YELLOW   APPearance CLEAR CLEAR   Specific Gravity, Urine 1.015 1.005 - 1.030   pH 7.0 5.0 - 8.0   Glucose, UA NEGATIVE NEGATIVE mg/dL   Hgb urine dipstick NEGATIVE NEGATIVE   Bilirubin Urine NEGATIVE NEGATIVE   Ketones, ur NEGATIVE NEGATIVE mg/dL   Protein, ur NEGATIVE NEGATIVE mg/dL   Nitrite NEGATIVE NEGATIVE   Leukocytes, UA TRACE (A) NEGATIVE   RBC / HPF 0-5 0 - 5 RBC/hpf   WBC, UA 0-5 0 - 5 WBC/hpf   Bacteria, UA NONE SEEN NONE SEEN   Squamous Epithelial / LPF 0-5 0 - 5   Mucus PRESENT   Protein / creatinine ratio, urine     Status: None   Collection Time: 10/06/17 11:20 AM  Result Value Ref Range   Creatinine, Urine 180.00 mg/dL   Total Protein, Urine 12 mg/dL   Protein Creatinine Ratio 0.07 0.00 - 0.15 mg/mg[Cre]  CBC     Status: Abnormal   Collection Time: 10/06/17 11:48  AM  Result Value Ref Range   WBC 12.8 (H) 4.0 - 10.5 K/uL   RBC 4.02 3.87 - 5.11 MIL/uL   Hemoglobin 11.0 (L) 12.0 - 15.0 g/dL   HCT 16.1 (L) 09.6 - 04.5 %   MCV 83.8 78.0 -  100.0 fL   MCH 27.4 26.0 - 34.0 pg   MCHC 32.6 30.0 - 36.0 g/dL   RDW 16.1 09.6 - 04.5 %   Platelets 223 150 - 400 K/uL  Comprehensive metabolic panel     Status: Abnormal   Collection Time: 10/06/17 11:48 AM  Result Value Ref Range   Sodium 135 135 - 145 mmol/L   Potassium 4.1 3.5 - 5.1 mmol/L   Chloride 107 101 - 111 mmol/L   CO2 19 (L) 22 - 32 mmol/L   Glucose, Bld 91 65 - 99 mg/dL   BUN 7 6 - 20 mg/dL   Creatinine, Ser 4.09 0.44 - 1.00 mg/dL   Calcium 8.8 (L) 8.9 - 10.3 mg/dL   Total Protein 7.3 6.5 - 8.1 g/dL   Albumin 3.3 (L) 3.5 - 5.0 g/dL   AST 17 15 - 41 U/L   ALT 15 14 - 54 U/L   Alkaline Phosphatase 302 (H) 38 - 126 U/L   Total Bilirubin 0.3 0.3 - 1.2 mg/dL   GFR calc non Af Amer >60 >60 mL/min   GFR calc Af Amer >60 >60 mL/min   Anion gap 9 5 - 15    Imaging:  [place] Mfm Ob Follow Up  Result Date: 10/06/2017 ----------------------------------------------------------------------  OBSTETRICS REPORT                       (Signed Final 10/06/2017 12:19 am) ---------------------------------------------------------------------- Patient Info  ID #:       [id number]                          D.O.B.:  08/13/1985 (31 yrs)  Name:       Wallie Renshaw Rayman                  Visit Date: 10/05/2017 04:11 pm ---------------------------------------------------------------------- Performed By  Performed By:     Marcellina Millin          Ref. Address:      [address] [place],                                                              [place] [address]  Attending:        Particia Nearing MD       Location:          [facility]  Referred By:      Willodean Rosenthal MD  ---------------------------------------------------------------------- Orders   #  Description  Code   1  [place] MFM OB FOLLOW UP                         [id number]  ----------------------------------------------------------------------   #  Ordered By               Order #        Accession #    Episode #   1  Candelaria Celeste            [id number]      [id number]     [id number]  ---------------------------------------------------------------------- Indications   [redacted] weeks gestation of pregnancy                Z3A.36   Hypertension - Chronic/Pre-existing (ASA)      O10.019   Encounter for other antenatal screening        Z36.2   follow-up  ---------------------------------------------------------------------- OB History  Gravidity:    4         Term:   3        Prem:   0         SAB:   0  TOP:          0       Ectopic:  0        Living: 3 ---------------------------------------------------------------------- Fetal Evaluation  Num Of Fetuses:     1  Fetal Heart         161  Rate(bpm):  Cardiac Activity:   Observed  Presentation:       Cephalic  Placenta:           Posterior, above cervical os  P. Cord Insertion:  Previously Visualized  Amniotic Fluid  AFI FV:      Subjectively within normal limits  AFI Sum(cm)     %Tile       Largest Pocket(cm)  11.21           31          4.23  RUQ(cm)       RLQ(cm)       LUQ(cm)        LLQ(cm)  4.23          2.23          2.89           1.86 ---------------------------------------------------------------------- Biometry  BPD:      79.9  mm     G. Age:  32w 0d          0  %    CI:         77.03  %    70 - 86                                                          FL/HC:       24.4  %    20.1 - 22.1  HC:      288.3  mm     G. Age:  31w 5d        < 3  %    HC/AC:       0.90       0.93 - 1.11  AC:      319.1  mm     G.  Age:  35w 6d         52  %    FL/BPD:      88.0  %    71 - 87  FL:       70.3  mm     G. Age:  36w 0d         43  %    FL/AC:       22.0  %    20 -  24  Est. FW:    2570   gm    5 lb 11 oz     36  % ---------------------------------------------------------------------- Gestational Age  LMP:           36w 1d        Date:  01/25/17                 EDD:    11/01/17  U/S Today:     33w 6d                                        EDD:    11/17/17  Best:          36w 1d     Det. By:  LMP  (01/25/17)          EDD:    11/01/17 ---------------------------------------------------------------------- Anatomy  Cranium:               Appears normal         Aortic Arch:            Previously seen  Cavum:                 Previously seen        Ductal Arch:            Previously seen  Ventricles:            Appears normal         Diaphragm:              Appears normal  Choroid Plexus:        Previously seen        Stomach:                Appears normal, left                                                                        sided  Cerebellum:            Previously seen        Abdomen:                Appears normal  Posterior Fossa:       Previously seen        Abdominal Wall:         Previously seen  Nuchal Fold:           Not applicable (>20    Cord Vessels:           Previously seen  wks GA)  Face:                  Orbits and profile     Kidneys:                Appear normal                         previously seen  Lips:                  Previously seen        Bladder:                Appears normal  Thoracic:              Appears normal         Spine:                  Previously seen  Heart:                 Appears normal         Upper Extremities:      Previously seen                         (4CH, axis, and situs  RVOT:                  Previously seen        Lower Extremities:      Previously seen  LVOT:                  Previously seen ---------------------------------------------------------------------- Cervix Uterus Adnexa  Cervix  Not visualized (advanced GA >29wks) ----------------------------------------------------------------------  Impression  SIUP at 36+1 weeks  Cephalic presentation  Normal interval anatomy; anatomic survey complete  Normal amniotic fluid volume  Appropriate interval growth with EFW at the 36th %tile ---------------------------------------------------------------------- Recommendations  Continue antenatal testing ----------------------------------------------------------------------                 Particia Nearing, MD Electronically Signed Final Report   10/06/2017 12:19 am ----------------------------------------------------------------------  MAU Management/MDM: Vitals and nursing notes reviewed Orders Placed This Encounter  Procedures  . Urinalysis, Routine w reflex microscopic  . Protein / creatinine ratio, urine  . CBC  . Comprehensive metabolic panel  . Vital signs  . Check blood pressure 20 minutes after giving hydrALAZINE 10 mg IV dose. Call MD if SBP >/= 160 and/or DBP >/= 110.  . Once BP goal is reached, repeat BP every 10 minutes for 1 hour, then every 15 minutes for 1 hours, then per policy for antepartum labor or post-partum.  Marland Kitchen Discharge patient Discharge disposition: 01-Home or Self Care; Discharge patient date: 10/06/2017    Meds ordered this encounter  Medications  . butalbital-acetaminophen-caffeine (FIORICET, ESGIC) 50-325-40 MG per tablet 2 tablet  . betamethasone acetate-betamethasone sodium phosphate (CELESTONE) injection 12 mg  . labetalol (NORMODYNE,TRANDATE) injection 20-80 mg  . hydrALAZINE (APRESOLINE) injection 10 mg  . lactated ringers infusion  . acetaminophen (TYLENOL) tablet 650 mg  . betamethasone acetate-betamethasone sodium phosphate (CELESTONE) 6 (3-3) MG/ML injection    Sig: Inject 2 mLs (12 mg total) into the muscle every 24 hours x 2 doses.    Dispense:  5 mL    Refill:  0    Order Specific Question:   Supervising Provider    Answer:   Reva Bores [[id number]]  . labetalol (NORMODYNE) 200 MG  tablet    Sig: Take 1 tablet (200 mg total) by mouth 2 (two) times  daily.    Dispense:  60 tablet    Refill:  0    Order Specific Question:   Supervising Provider    Answer:   Reva Bores [[id number]]   Megan Hunt is a 32 y.o. [phone] at [redacted]w[redacted]d with PMH of HTN who presents to MAU from clinic visit for high blood pressure to 150s/90s, HA (7/10), and vision changes (seeing spots) worsening over the past two weeks which are concerning for SIPE with severe features.  Evaluated patient with [facility] labs which were reassuring. UA was neg for protein, CBC nml, creatinine 0.59, UPC 0.07, AST 17, ALT 15.   HA improved to a pain level of 4/10 after Fioricet. There was improvement in seeing spots.  During MAU admission, patient had two severe range blood pressures: 163/92 at 1301 and 164/79 at 1245.  Patient received 1x labetalol  and  1x betamethasone.  Her headache improved to pain level of 2/10 with Tylenol.  Repeat blood pressure improved to 138/83.  - UA - UPC - CBC - CMP - GC/Chlamydia - Group B Strep culture   Plan of care reviewed with patient, including labs and tests ordered and medical treatment.  Consult none.  Treatments in MAU included Fioricet & Tylenol for HA, BMZ x1, and labetalol  1x for BP, LR.   I personally reviewed the patient's NST today, found to be REACTIVE. 140 bpm, mod var, +accels, no decels. CTX: irregular.  ASSESSMENT cHTN  PLAN Patient discharged home in stable condition. Follow-up in [facility] clinic tomorrow morning for BP check and 2nd dose of BMZ. Counseled patient on return precautions. Start taking labetalol  BID.  Allergies as of 10/06/2017   No Known Allergies     Medication List    TAKE these medications   betamethasone acetate-betamethasone sodium phosphate 6 (3-3) MG/ML injection Commonly known as:  CELESTONE Inject 2 mLs (12 mg total) into the muscle every 24 hours x 2 doses. Start taking on:  10/07/2017   labetalol 200 MG tablet Commonly known as:  NORMODYNE Take 1 tablet (200 mg total) by mouth 2  (two) times daily.      Christel Mormon, Medical Student 10/06/2017, 4:38 PM   CNM attestation:  I have seen and examined this patient and agree with above documentation in the med students's note.   Megan Hunt is a 32 y.o. [phone] at [redacted]w[redacted]d here after her pre-natal visit at [facility].  She has a history of chronic hypertension; not on medications. Taking baba ASA irregularly. No history of pre-e. She informed her provider today that she had a HA for two weeks and had frequent black spots in her vision. She has not taken anything for her headache. She is unable to quantify how frequently she is seeing black spots; she says she doesn't see them at this moment but they just come and go.   +FM, denies LOF, VB, occasional contractions, vaginal discharge.   PE: Patient Vitals for the past 24 hrs:  BP Temp Pulse Height Weight  10/06/17 1501 138/83 - 90 - -  10/06/17 1446 (!) 153/87 - 86 - -  10/06/17 1431 (!) 151/84 - 88 - -  10/06/17 1416 (!) 141/79 - 88 - -  10/06/17 1402 (!) 145/91 - 89 - -  10/06/17 1346 (!) 142/93 - 85 - -  10/06/17 1315 (!) 154/95 - 86 - -  10/06/17  1301 (!) 163/92 - 88 - -  10/06/17 1245 (!) 164/79 - 100 - -  10/06/17 1230 (!) 154/85 - 93 - -  10/06/17 1215 (!) 150/95 - 95 - -  10/06/17 1201 (!) 157/88 - 89 - -  10/06/17 1140 (!) 147/92 - 89 - -  10/06/17 1128 - 97.6 F (36.4 C) - - -  10/06/17 1126 (!) 145/88 - 85 5\' 4"  (1.626 m) 210 lb (95.3 kg)   Gen: calm comfortable, NAD Resp: normal effort, no distress Heart: Regular rate Abd: Soft, NT, gravid, S=D  FHR: Baseline 130, present Variability, pos accels, no decels Toco: UC's irregular   ROS, labs, PMH reviewed  Orders Placed This Encounter  Procedures  . Urinalysis, Routine w reflex microscopic  . Protein / creatinine ratio, urine  . CBC  . Comprehensive metabolic panel  . Vital signs  . Check blood pressure 20 minutes after giving hydrALAZINE 10 mg IV dose. Call MD if SBP >/= 160 and/or DBP >/= 110.   . Once BP goal is reached, repeat BP every 10 minutes for 1 hour, then every 15 minutes for 1 hours, then per policy for antepartum labor or post-partum.  Marland Kitchen Discharge patient Discharge disposition: 01-Home or Self Care; Discharge patient date: 10/06/2017   Meds ordered this encounter  Medications  . butalbital-acetaminophen-caffeine (FIORICET, ESGIC) 50-325-40 MG per tablet 2 tablet  . betamethasone acetate-betamethasone sodium phosphate (CELESTONE) injection 12 mg  . labetalol (NORMODYNE,TRANDATE) injection 20-80 mg  . hydrALAZINE (APRESOLINE) injection 10 mg  . lactated ringers infusion  . acetaminophen (TYLENOL) tablet 650 mg  . betamethasone acetate-betamethasone sodium phosphate (CELESTONE) 6 (3-3) MG/ML injection    Sig: Inject 2 mLs (12 mg total) into the muscle every 24 hours x 2 doses.    Dispense:  5 mL    Refill:  0    Order Specific Question:   Supervising Provider    Answer:   Reva Bores [[id number]]  . labetalol (NORMODYNE) 200 MG tablet    Sig: Take 1 tablet (200 mg total) by mouth 2 (two) times daily.    Dispense:  60 tablet    Refill:  0    Order Specific Question:   Supervising Provider    Answer:   Samara Snide    MDM -NST reassuring -HA now 2/10 with Tylenol, 2 Fioricet earlier did not help.  -no black spots now, patient says they haven't been there for a while. Patient cannot quantify the last time she had had black spots.   -patient received 20 mg of labetalol IV after two severe range pressures; although she had a BP of 154/95 before receiving her dose of labetalol.   -Case discussed extensively with Dr. Vergie Living, who recommends discharge now and follow-up BP visit tomorrow as well as start labetalol.   Assessment: 1. Supervision of other normal pregnancy, antepartum   2. Depression affecting pregnancy, antepartum   3. Asthma affecting pregnancy, antepartum   4. Benign essential hypertension affecting pregnancy, antepartum     Plan:  - Discharge  home in stable condition. Begin labatalol 200 mg BID tonight at 8 opm.  No att. providers found. - Pre-eclampsia precautions reviewed; precautions. - labor precautions and fetal kick counts. - Call made to [facility] office for patient to have nurse visit tomorrow and 2nd dose of BMZ. CMA made aware of need for 2nd dose of beta-methasone on May 31st.  Return to maternity admissions symptoms worsen  Marylene Land, CNM 10/06/2017 4:38  PM

## 2017-10-06 NOTE — Progress Notes (Signed)
Provider instructed patient to go to MAU for evaluation of increased BP. Armandina Stammer RN

## 2017-10-06 NOTE — Progress Notes (Signed)
   PRENATAL VISIT NOTE  Subjective:  Megan Hunt is a 32 y.o. [phone] at [redacted]w[redacted]d being seen today for ongoing prenatal care.  She is currently monitored for the following issues for this high-risk pregnancy and has HYPERCHOLESTEROLEMIA; ANXIETY DEPRESSION; Essential hypertension; Allergic rhinitis; Asthma, mild intermittent; Constipation; Backache; SOMATIC DYSFUNCTION; CHEST PAIN, ATYPICAL; Migraine; Hemorrhoids, external; Fibromyalgia; Sinusitis, acute maxillary; Fatigue; Insomnia; Supervision of other normal pregnancy, antepartum; Depression affecting pregnancy, antepartum; Asthma affecting pregnancy, antepartum; and Benign essential hypertension affecting pregnancy, antepartum on their problem list.  Patient reports headache and spots in the vision over past week.  Contractions: Irritability. Vag. Bleeding: None.  Movement: Present. Denies leaking of fluid.   The following portions of the patient's history were reviewed and updated as appropriate: allergies, current medications, past family history, past medical history, past social history, past surgical history and problem list. Problem list updated.  Objective:   Vitals:   10/06/17 0906 10/06/17 0945  BP: (!) 151/72 (!) 146/78  Pulse: 96 99  Weight: 210 lb (95.3 kg)     Fetal Status: Fetal Heart Rate (bpm): 131 Fundal Height: 36 cm Movement: Present  Presentation: Vertex  General:  Alert, oriented and cooperative. Patient is in no acute distress.  Skin: Skin is warm and dry. No rash noted.   Cardiovascular: Normal heart rate noted  Respiratory: Normal respiratory effort, no problems with respiration noted  Abdomen: Soft, gravid, appropriate for gestational age.  Pain/Pressure: Present     Pelvic: Cervical exam performed Dilation: Fingertip Effacement (%): Thick Station: -3  Extremities: Normal range of motion.  Edema: Trace  Mental Status: Normal mood and affect. Normal behavior. Normal judgment and thought content.   Assessment  and Plan:  Pregnancy: G4P3003 at [redacted]w[redacted]d  1. Prenatal care, antepartum - Culture, beta strep (group b only) - GC/Chlamydia probe amp ([place])not at [facility]  2. Essential hypertension BP elevated. ? Superimposed preeclampsia.  NST Reactive. Will send to MAU.  3. Supervision of other normal pregnancy, antepartum  4. Depression affecting pregnancy, antepartum controlled  Preterm labor symptoms and general obstetric precautions including but not limited to vaginal bleeding, contractions, leaking of fluid and fetal movement were reviewed in detail with the patient. Please refer to After Visit Summary for other counseling recommendations.  No follow-ups on file.  Future Appointments  Date Time Provider Department Center  10/10/2017  9:30 AM [facility] NURSE [facility] None  10/13/2017  9:30 AM Constant, Peggy, MD [facility] None  10/17/2017  9:30 AM [facility] NURSE [facility] None  10/20/2017  9:30 AM Allie Bossier, MD [facility] None  11/24/2017  1:30 PM Bradd Canary, MD [facility]    Levie Heritage, DO

## 2017-10-06 NOTE — Discharge Instructions (Signed)
Take labetal   Hypertension During Pregnancy Hypertension is also called high blood pressure. High blood pressure means that the force of your blood moving in your body is too strong. When you are pregnant, this condition should be watched carefully. It can cause problems for you and your baby. Follow these instructions at home: Eating and drinking  Drink enough fluid to keep your pee (urine) clear or pale yellow.  Eat healthy foods that are low in salt (sodium). ? Do not add salt to your food. ? Check labels on foods and drinks to see much salt is in them. Look on the label where you see "Sodium." Lifestyle  Do not use any products that contain nicotine or tobacco, such as cigarettes and e-cigarettes. If you need help quitting, ask your doctor.  Do not use alcohol.  Avoid caffeine.  Avoid stress. Rest and get plenty of sleep. General instructions  Take over-the-counter and prescription medicines only as told by your doctor.  While lying down, lie on your left side. This keeps pressure off your baby.  While sitting or lying down, raise (elevate) your feet. Try putting some pillows under your lower legs.  Exercise regularly. Ask your doctor what kinds of exercise are best for you.  Keep all prenatal and follow-up visits as told by your doctor. This is important. Contact a doctor if:  You have symptoms that your doctor told you to watch for, such as: ? Fever. ? Throwing up (vomiting). ? Headache. Get help right away if:  You have very bad pain in your belly (abdomen).  You are throwing up, and this does not get better with treatment.  You suddenly get swelling in your hands, ankles, or face.  You gain 4 lb (1.8 kg) or more in 1 week.  You get bleeding from your vagina.  You have blood in your pee.  You do not feel your baby moving as much as normal.  You have a change in vision.  You have muscle twitching or sudden tightening (spasms).  You have trouble  breathing.  Your lips or fingernails turn blue. This information is not intended to replace advice given to you by your health care provider. Make sure you discuss any questions you have with your health care provider. Document Released: 05/29/2010 Document Revised: 01/06/2016 Document Reviewed: 01/06/2016 Elsevier Interactive Patient Education  [employer].

## 2017-10-06 NOTE — MAU Note (Signed)
Pt sent from office for elevated b/p. C/o headache and seeing spots. Good fetal movement felt.

## 2017-10-07 ENCOUNTER — Other Ambulatory Visit (INDEPENDENT_AMBULATORY_CARE_PROVIDER_SITE_OTHER): Payer: 59

## 2017-10-07 VITALS — BP 125/80 | HR 95

## 2017-10-07 DIAGNOSIS — O0930 Supervision of pregnancy with insufficient antenatal care, unspecified trimester: Secondary | ICD-10-CM | POA: Diagnosis not present

## 2017-10-07 LAB — GC/CHLAMYDIA PROBE AMP (~~LOC~~) NOT AT ARMC
CHLAMYDIA, DNA PROBE: NEGATIVE
Neisseria Gonorrhea: NEGATIVE

## 2017-10-07 MED ORDER — BETAMETHASONE SOD PHOS & ACET 6 (3-3) MG/ML IJ SUSP
12.0000 mg | Freq: Once | INTRAMUSCULAR | Status: AC
  Administered 2017-10-07: 12 mg via INTRAMUSCULAR

## 2017-10-07 NOTE — Progress Notes (Signed)
Megan Hunt here for Betamethasone  Injection.  Injection administered without complication. Patient will return in treatment completed for next injection. Pt also had BP check. BP was 125/80.   chiquita l wilson, CMA 10/07/2017  10:29 AM

## 2017-10-10 ENCOUNTER — Ambulatory Visit (INDEPENDENT_AMBULATORY_CARE_PROVIDER_SITE_OTHER): Payer: 59

## 2017-10-10 VITALS — BP 132/79 | HR 89

## 2017-10-10 DIAGNOSIS — O10019 Pre-existing essential hypertension complicating pregnancy, unspecified trimester: Secondary | ICD-10-CM

## 2017-10-10 DIAGNOSIS — O10013 Pre-existing essential hypertension complicating pregnancy, third trimester: Secondary | ICD-10-CM

## 2017-10-10 LAB — CULTURE, BETA STREP (GROUP B ONLY): STREP GP B CULTURE: NEGATIVE

## 2017-10-10 NOTE — Progress Notes (Addendum)
NST only visit. Patient states she did start the labatalol 200mg  BID. Patient states headaches have ease some. Armandina StammerJennifer Trayonna Bachmeier RN  NST reviewed and reactive.  Carolyn L. Harraway-Smith, M.D., Evern CoreFACOG

## 2017-10-13 ENCOUNTER — Ambulatory Visit (INDEPENDENT_AMBULATORY_CARE_PROVIDER_SITE_OTHER): Payer: 59 | Admitting: Obstetrics and Gynecology

## 2017-10-13 ENCOUNTER — Telehealth (HOSPITAL_COMMUNITY): Payer: Self-pay | Admitting: *Deleted

## 2017-10-13 VITALS — BP 143/78 | HR 87 | Wt 211.0 lb

## 2017-10-13 DIAGNOSIS — O10013 Pre-existing essential hypertension complicating pregnancy, third trimester: Secondary | ICD-10-CM | POA: Diagnosis not present

## 2017-10-13 DIAGNOSIS — Z3480 Encounter for supervision of other normal pregnancy, unspecified trimester: Secondary | ICD-10-CM

## 2017-10-13 DIAGNOSIS — F329 Major depressive disorder, single episode, unspecified: Secondary | ICD-10-CM

## 2017-10-13 DIAGNOSIS — F32A Depression, unspecified: Secondary | ICD-10-CM

## 2017-10-13 DIAGNOSIS — O99340 Other mental disorders complicating pregnancy, unspecified trimester: Secondary | ICD-10-CM

## 2017-10-13 DIAGNOSIS — O10019 Pre-existing essential hypertension complicating pregnancy, unspecified trimester: Secondary | ICD-10-CM

## 2017-10-13 DIAGNOSIS — O99343 Other mental disorders complicating pregnancy, third trimester: Secondary | ICD-10-CM

## 2017-10-13 NOTE — Progress Notes (Signed)
   PRENATAL VISIT NOTE  Subjective:  Megan Hunt is a 32 y.o. [phone] at 5485w2d being seen today for ongoing prenatal care.  She is currently monitored for the following issues for this high-risk pregnancy and has HYPERCHOLESTEROLEMIA; ANXIETY DEPRESSION; Essential hypertension; Allergic rhinitis; Asthma, mild intermittent; Constipation; Backache; SOMATIC DYSFUNCTION; CHEST PAIN, ATYPICAL; Migraine; Hemorrhoids, external; Fibromyalgia; Sinusitis, acute maxillary; Fatigue; Insomnia; Supervision of other normal pregnancy, antepartum; Depression affecting pregnancy, antepartum; Asthma affecting pregnancy, antepartum; and Benign essential hypertension affecting pregnancy, antepartum on their problem list.  Patient reports occasional dizziness and some mild headaches which self resolve without medication.  Contractions: Irritability. Vag. Bleeding: None.  Movement: Present. Denies leaking of fluid.   The following portions of the patient's history were reviewed and updated as appropriate: allergies, current medications, past family history, past medical history, past social history, past surgical history and problem list. Problem list updated.  Objective:   Vitals:   10/13/17 0937  BP: (!) 143/78  Pulse: 87    Fetal Status: Fetal Heart Rate (bpm): NST   Movement: Present     General:  Alert, oriented and cooperative. Patient is in no acute distress.  Skin: Skin is warm and dry. No rash noted.   Cardiovascular: Normal heart rate noted  Respiratory: Normal respiratory effort, no problems with respiration noted  Abdomen: Soft, gravid, appropriate for gestational age.  Pain/Pressure: Present     Pelvic: Cervical exam deferred        Extremities: Normal range of motion.  Edema: Trace  Mental Status: Normal mood and affect. Normal behavior. Normal judgment and thought content.   Assessment and Plan:  Pregnancy: G4P3003 at 7485w2d  1. Supervision of other normal pregnancy, antepartum Patient is  doing well Encouraged hydration and taking tylenol as needed  2. Benign essential hypertension affecting pregnancy, antepartum BP well controlled on labetalol Continue antenatal testing Normal growth 5/30 NST reviewed and reactive with baseline 140, mod variability, + accels, no decels AFI 10.8 Patient will be scheduled for IOL at 39 weeks  3. Depression affecting pregnancy, antepartum Well controlled without medication  Preterm labor symptoms and general obstetric precautions including but not limited to vaginal bleeding, contractions, leaking of fluid and fetal movement were reviewed in detail with the patient. Please refer to After Visit Summary for other counseling recommendations.  Return in about 1 week (around 10/20/2017) for ROB, NST.  Future Appointments  Date Time Provider Department Center  10/17/2017  9:30 AM [facility] NURSE [facility] None  10/20/2017  9:30 AM Allie Bossierove, Myra C, MD [facility] None  10/25/2017  7:30 AM [facility] None  11/24/2017  1:30 PM Bradd CanaryBlyth, Stacey A, MD [facility]    Catalina AntiguaPeggy Ansen Sayegh, MD

## 2017-10-13 NOTE — Telephone Encounter (Signed)
Preadmission screen  

## 2017-10-13 NOTE — Progress Notes (Signed)
Patient complaining of dizziness yesterday. IOL scheduled for 10-25-17 . Armandina StammerJennifer Mayelin Panos RN

## 2017-10-17 ENCOUNTER — Ambulatory Visit (INDEPENDENT_AMBULATORY_CARE_PROVIDER_SITE_OTHER): Payer: 59

## 2017-10-17 VITALS — BP 142/75 | HR 93

## 2017-10-17 DIAGNOSIS — O10019 Pre-existing essential hypertension complicating pregnancy, unspecified trimester: Secondary | ICD-10-CM

## 2017-10-17 DIAGNOSIS — O10013 Pre-existing essential hypertension complicating pregnancy, third trimester: Secondary | ICD-10-CM | POA: Diagnosis not present

## 2017-10-17 NOTE — Progress Notes (Signed)
NST only visit. Patient states she had headache and seeing spots over the weekend. Armandina StammerJennifer Adaleigh Warf RN

## 2017-10-18 ENCOUNTER — Inpatient Hospital Stay (HOSPITAL_COMMUNITY)
Admission: AD | Admit: 2017-10-18 | Discharge: 2017-10-19 | Disposition: A | Discharge status: Home / Self Care | Payer: 59 | Source: Ambulatory Visit | Attending: Obstetrics & Gynecology | Admitting: Obstetrics & Gynecology

## 2017-10-18 DIAGNOSIS — O26893 Other specified pregnancy related conditions, third trimester: Secondary | ICD-10-CM | POA: Insufficient documentation

## 2017-10-18 DIAGNOSIS — M549 Dorsalgia, unspecified: Secondary | ICD-10-CM

## 2017-10-18 DIAGNOSIS — O10913 Unspecified pre-existing hypertension complicating pregnancy, third trimester: Secondary | ICD-10-CM

## 2017-10-18 DIAGNOSIS — Z3A38 38 weeks gestation of pregnancy: Secondary | ICD-10-CM | POA: Insufficient documentation

## 2017-10-18 DIAGNOSIS — M545 Low back pain: Secondary | ICD-10-CM | POA: Insufficient documentation

## 2017-10-18 DIAGNOSIS — M533 Sacrococcygeal disorders, not elsewhere classified: Secondary | ICD-10-CM | POA: Insufficient documentation

## 2017-10-18 DIAGNOSIS — O10013 Pre-existing essential hypertension complicating pregnancy, third trimester: Secondary | ICD-10-CM | POA: Insufficient documentation

## 2017-10-18 NOTE — MAU Note (Signed)
Pt reports a lot of rectal and pelvic pressure that started today. Has been constant. Pt reports occasional braxton hicks contractions. Reports mucousy fluid coming down legs when she's walking down stairs that started Sunday. Denies vaginal bleeding. Reports good fetal movement.

## 2017-10-19 ENCOUNTER — Encounter (HOSPITAL_COMMUNITY): Payer: Self-pay | Admitting: *Deleted

## 2017-10-19 DIAGNOSIS — Z3A38 38 weeks gestation of pregnancy: Secondary | ICD-10-CM | POA: Diagnosis not present

## 2017-10-19 DIAGNOSIS — M533 Sacrococcygeal disorders, not elsewhere classified: Secondary | ICD-10-CM | POA: Diagnosis not present

## 2017-10-19 DIAGNOSIS — M545 Low back pain: Secondary | ICD-10-CM | POA: Diagnosis not present

## 2017-10-19 DIAGNOSIS — O26893 Other specified pregnancy related conditions, third trimester: Secondary | ICD-10-CM | POA: Diagnosis not present

## 2017-10-19 DIAGNOSIS — O10913 Unspecified pre-existing hypertension complicating pregnancy, third trimester: Secondary | ICD-10-CM | POA: Diagnosis not present

## 2017-10-19 DIAGNOSIS — M549 Dorsalgia, unspecified: Secondary | ICD-10-CM | POA: Diagnosis not present

## 2017-10-19 DIAGNOSIS — O10013 Pre-existing essential hypertension complicating pregnancy, third trimester: Secondary | ICD-10-CM | POA: Diagnosis not present

## 2017-10-19 MED ORDER — CYCLOBENZAPRINE HCL 10 MG PO TABS
10.0000 mg | ORAL_TABLET | Freq: Once | ORAL | Status: AC
  Administered 2017-10-19: 10 mg via ORAL
  Filled 2017-10-19: qty 1

## 2017-10-19 MED ORDER — NITROGLYCERIN 2 % TD OINT: 1.0000 [in_us] | TOPICAL_OINTMENT | Freq: Four times a day (QID) | TRANSDERMAL | Status: DC

## 2017-10-19 MED ORDER — NALBUPHINE HCL 10 MG/ML IJ SOLN
10.0000 mg | Freq: Once | INTRAMUSCULAR | Status: AC
  Administered 2017-10-19: 10 mg via INTRAMUSCULAR
  Filled 2017-10-19: qty 1

## 2017-10-19 MED ORDER — LABETALOL HCL 200 MG PO TABS: 200.0000 mg | ORAL_TABLET | Freq: Three times a day (TID) | ORAL | 0 refills | Status: DC

## 2017-10-19 NOTE — MAU Note (Signed)
PT SAYS SHE HAS PRESSURE IN RECTUM - STARTED TODAY.   . VE IN OFFICE -  2 WEEKS AGO - CLOSED.  FEELS  SOME IRREG UC'S.    DENIES HSV AND MRSA.    GBS- NEG

## 2017-10-19 NOTE — MAU Provider Note (Signed)
Chief Complaint:  Rectal Pain and Back Pain  HPI: Megan Hunt is a 32 y.o. [phone] at 2838w1dwho presents to maternity admissions reporting rectal pain and back pain. She reports low back pain has been occurring for months during this pregnancy and getting worse as she gets further into her pregnancy. She describes the pain as aching in her lower back, rates pain 8/10- has not taken any medication for back pain or tried any different positions to relieve back pain. She reports rectal pain started tonight around 2200- she reports the pain being below her back but above her rectum around her cheeks. She rates pain 6/10- has not taken any medication for pain. Describes rectal pain as pressure and aching. She denies constipation or hemorrhoids. She reports some irregular contractions that are not painful. She reports good fetal movement, denies LOF, vaginal bleeding, vaginal itching/burning, urinary symptoms, h/a, dizziness, n/v, or fever/chills. Has IOL scheduled for next Tuesday for Chronic Hypertension.   Past Medical History: Past Medical History:  Diagnosis Date  . Allergic rhinitis   . Allergy   . Anemia   . Anxiety   . Anxiety and depression   . Asthma   . Asthma, mild intermittent 10/27/2008   Qualifier: Diagnosis of  By: Kriste BasqueNadel MD, Lonzo CloudScott M   . Atypical chest pain   . Back pain   . Constipation   . History of migraines   . Hypercholesteremia   . Hypertension    no longer taking meds  . Insomnia 05/15/2016  . Somatic dysfunction     Past obstetric history: OB History  Gravida Para Term Preterm AB Living  4 3 3     3   SAB TAB Ectopic Multiple Live Births          3    # Outcome Date GA Lbr Len/2nd Weight Sex Delivery Anes PTL Lv  4 Current           3 Term 03/22/13 5868w6d 18:01 / 00:24 6 lb 11.9 oz (3.06 kg) M Vag-Spont EPI  LIV     Birth Comments: WNL  2 Term 06/28/09 7841w0d   F Vag-Spont   LIV  1 Term 02/18/08    Genella MechM Vag-Spont   LIV    Past Surgical History: Past Surgical  History:  Procedure Laterality Date  . WISDOM TOOTH EXTRACTION      Family History: Family History  Problem Relation Age of Onset  . Hypertension Father   . Hypertension Mother   . Arthritis Brother   . Stroke Maternal Grandfather   . Hypertension Maternal Grandfather     Social History: Social History   Tobacco Use  . Smoking status: Never Smoker  . Smokeless tobacco: Never Used  Substance Use Topics  . Alcohol use: No  . Drug use: No    Allergies: No Known Allergies  Meds:  Medications Prior to Admission  Medication Sig Dispense Refill Last Dose  . [DISCONTINUED] labetalol (NORMODYNE) 200 MG tablet Take 1 tablet (200 mg total) by mouth 2 (two) times daily. 60 tablet 0 10/19/2017 at Unknown time  . betamethasone acetate-betamethasone sodium phosphate (CELESTONE) 6 (3-3) MG/ML injection Inject 2 mLs (12 mg total) into the muscle every 24 hours x 2 doses. 5 mL 0 Taking    ROS:  Review of Systems  Respiratory: Negative.   Cardiovascular: Negative.   Gastrointestinal: Negative.   Genitourinary: Negative.   Musculoskeletal: Positive for back pain.       Rectal pain   I  have reviewed patient's Past Medical Hx, Surgical Hx, Family Hx, Social Hx, medications and allergies.   Physical Exam   Patient Vitals for the past 24 hrs:  BP Temp Temp src Pulse Resp SpO2 Height Weight  10/19/17 0235 (!) 152/86 - - - - - - -  10/19/17 0217 (!) 152/86 - - 78 - - - -  10/19/17 0202 (!) 151/89 - - 81 - - - -  10/19/17 0200 (!) 155/101 - - 92 - - - -  10/18/17 2348 (!) 159/99 98.1 F (36.7 C) Oral 94 20 100 % 5\' 4"  (1.626 m) 215 lb (97.5 kg)   Constitutional: Well-developed, obese female in no acute distress.  Cardiovascular: normal rate Respiratory: normal effort GI: Abd soft, non-tender, gravid appropriate for gestational age.  MS: Extremities nontender, no edema, normal ROM Neurologic: Alert and oriented x 4.  Rectal: small intractable hemorrhoid noted- 0.5cm not thrombosed.  Patient points that pain is above that area and is in the area of her coccyx bone.   CERVICAL EXAM: Dilation: 2.5 Effacement (%): 50 Station: Ballotable Presentation: Vertex Exam by:: Gionna Polak, CNM  FHT:  Baseline 140 , moderate variability, accelerations present, no decelerations Contractions: none  MAU Course/MDM:  Meds ordered this encounter  Medications  . cyclobenzaprine (FLEXERIL) tablet 10 mg  . nalbuphine (NUBAIN) injection 10 mg  . labetalol (NORMODYNE) 200 MG tablet    Sig: Take 1 tablet (200 mg total) by mouth every 8 (eight) hours.    Dispense:  60 tablet    Refill:  0    Order Specific Question:   Supervising Provider    Answer:   Jaynie Collins A [[id number]]   NST reviewed- reactive   Treatments in MAU included Flexeril 10mg  for back pain- patient reports slight decrease in back pain to 6/10 with medication treatment. Nubain 10mg  IM given prior to discharge- patient reports decreased back and rectal pain to 2/10 with medication. Patient declines getting repeat lab work for PreE due to lab work being obtained on 5/30 and was normal. Educated and discussed importance of monitoring s/s of PreE and return to MAU asap with any of the s/s. Discussed increased Labetalol to TID due to continued elevated BP with f/u BP check in the office tomorrow morning. Patient verbalizes understanding. Okay to discharge home.    Pt discharge with strict PreE precautions.  Assessment: 1. Maternal chronic hypertension in third trimester   2. Back pain during pregnancy in third trimester   3. Pain in the coccyx     Plan: Discharge home Labor precautions and fetal kick counts BP check in the office tomorrow morning  Follow up as scheduled for prenatal appointments  Return to MAU as needed for s/s of PreE and/or labor evaluation IOL scheduled for 6/19 for CHTN   Follow-up Information    [facility] Follow up.   Why:  Follow up as scheduled for prenatal appointments   Contact information: [address] [facility] [place] [address]          Allergies as of 10/19/2017   No Known Allergies     Medication List    STOP taking these medications   betamethasone acetate-betamethasone sodium phosphate 6 (3-3) MG/ML injection Commonly known as:  CELESTONE     TAKE these medications   labetalol 200 MG tablet Commonly known as:  NORMODYNE Take 1 tablet (200 mg total) by mouth every 8 (eight) hours. What changed:  when to take this  Steward Drone Certified Nurse-Midwife 10/19/2017 7:39 AM

## 2017-10-20 ENCOUNTER — Ambulatory Visit (INDEPENDENT_AMBULATORY_CARE_PROVIDER_SITE_OTHER): Payer: 59 | Admitting: Obstetrics & Gynecology

## 2017-10-20 VITALS — BP 138/89 | HR 97 | Wt 212.4 lb

## 2017-10-20 DIAGNOSIS — O10019 Pre-existing essential hypertension complicating pregnancy, unspecified trimester: Secondary | ICD-10-CM

## 2017-10-20 DIAGNOSIS — O10013 Pre-existing essential hypertension complicating pregnancy, third trimester: Secondary | ICD-10-CM

## 2017-10-20 DIAGNOSIS — Z3483 Encounter for supervision of other normal pregnancy, third trimester: Secondary | ICD-10-CM

## 2017-10-20 DIAGNOSIS — Z3480 Encounter for supervision of other normal pregnancy, unspecified trimester: Secondary | ICD-10-CM

## 2017-10-20 NOTE — Progress Notes (Signed)
   PRENATAL VISIT NOTE  Subjective:  Megan Hunt is a 32 y.o. [phone] at 6287w2d being seen today for ongoing prenatal care.  She is currently monitored for the following issues for this high-risk pregnancy and has HYPERCHOLESTEROLEMIA; ANXIETY DEPRESSION; Essential hypertension; Allergic rhinitis; Asthma, mild intermittent; Constipation; Backache; SOMATIC DYSFUNCTION; CHEST PAIN, ATYPICAL; Migraine; Hemorrhoids, external; Fibromyalgia; Sinusitis, acute maxillary; Fatigue; Insomnia; Supervision of other normal pregnancy, antepartum; Depression affecting pregnancy, antepartum; Asthma affecting pregnancy, antepartum; and Benign essential hypertension affecting pregnancy, antepartum on their problem list.  Patient reports no complaints.  Contractions: Irregular. Vag. Bleeding: Scant.  Movement: Present. Denies leaking of fluid.   The following portions of the patient's history were reviewed and updated as appropriate: allergies, current medications, past family history, past medical history, past social history, past surgical history and problem list. Problem list updated.  Objective:   Vitals:   10/20/17 0938  BP: 138/89  Pulse: 97  Weight: 212 lb 6.4 oz (96.3 kg)    Fetal Status: Fetal Heart Rate (bpm): NST   Movement: Present     General:  Alert, oriented and cooperative. Patient is in no acute distress.  Skin: Skin is warm and dry. No rash noted.   Cardiovascular: Normal heart rate noted  Respiratory: Normal respiratory effort, no problems with respiration noted  Abdomen: Soft, gravid, appropriate for gestational age.  Pain/Pressure: Present     Pelvic: Cervical exam performed        Extremities: Normal range of motion.  Edema: Trace  Mental Status: Normal mood and affect. Normal behavior. Normal judgment and thought content.   Assessment and Plan:  Pregnancy: G4P3003 at 8087w2d  1. Benign essential hypertension affecting pregnancy, antepartum - IOL at 39 weeks - NST next  Monday  2. Supervision of other normal pregnancy, antepartum   Term labor symptoms and general obstetric precautions including but not limited to vaginal bleeding, contractions, leaking of fluid and fetal movement were reviewed in detail with the patient. Please refer to After Visit Summary for other counseling recommendations.  Return in about 5 days (around 10/25/2017) for NST and then 4 weeks for PP exam.  Future Appointments  Date Time Provider Department Center  10/25/2017  7:30 AM [facility] RO[facility] None  11/24/2017  1:30 PM Bradd CanaryBlyth, Stacey A, MD [facility]    Allie BossierMyra C Greer Wainright, MD

## 2017-10-24 ENCOUNTER — Ambulatory Visit (INDEPENDENT_AMBULATORY_CARE_PROVIDER_SITE_OTHER): Payer: 59

## 2017-10-24 VITALS — BP 146/93 | HR 86

## 2017-10-24 DIAGNOSIS — O10913 Unspecified pre-existing hypertension complicating pregnancy, third trimester: Secondary | ICD-10-CM

## 2017-10-24 DIAGNOSIS — O10919 Unspecified pre-existing hypertension complicating pregnancy, unspecified trimester: Secondary | ICD-10-CM

## 2017-10-24 NOTE — Progress Notes (Signed)
NST only visit. Patient scheduled for induction tomorrow. Armandina StammerJennifer Hannahmarie Asberry RN

## 2017-10-25 ENCOUNTER — Inpatient Hospital Stay (HOSPITAL_COMMUNITY)
Admission: RE | Admit: 2017-10-25 | Discharge: 2017-10-27 | DRG: 806 | Disposition: A | Discharge status: Home / Self Care | Payer: 59 | Attending: Family Medicine | Admitting: Family Medicine

## 2017-10-25 ENCOUNTER — Inpatient Hospital Stay (HOSPITAL_COMMUNITY): Payer: 59 | Admitting: Anesthesiology

## 2017-10-25 ENCOUNTER — Encounter (HOSPITAL_COMMUNITY): Payer: Self-pay

## 2017-10-25 DIAGNOSIS — I10 Essential (primary) hypertension: Secondary | ICD-10-CM

## 2017-10-25 DIAGNOSIS — Z3480 Encounter for supervision of other normal pregnancy, unspecified trimester: Secondary | ICD-10-CM

## 2017-10-25 DIAGNOSIS — O99340 Other mental disorders complicating pregnancy, unspecified trimester: Secondary | ICD-10-CM

## 2017-10-25 DIAGNOSIS — O10019 Pre-existing essential hypertension complicating pregnancy, unspecified trimester: Secondary | ICD-10-CM

## 2017-10-25 DIAGNOSIS — Z3A39 39 weeks gestation of pregnancy: Secondary | ICD-10-CM | POA: Diagnosis not present

## 2017-10-25 DIAGNOSIS — O1002 Pre-existing essential hypertension complicating childbirth: Principal | ICD-10-CM | POA: Diagnosis present

## 2017-10-25 DIAGNOSIS — F32A Depression, unspecified: Secondary | ICD-10-CM

## 2017-10-25 DIAGNOSIS — O10919 Unspecified pre-existing hypertension complicating pregnancy, unspecified trimester: Secondary | ICD-10-CM | POA: Diagnosis present

## 2017-10-25 DIAGNOSIS — F329 Major depressive disorder, single episode, unspecified: Secondary | ICD-10-CM

## 2017-10-25 DIAGNOSIS — J45909 Unspecified asthma, uncomplicated: Secondary | ICD-10-CM

## 2017-10-25 DIAGNOSIS — O99519 Diseases of the respiratory system complicating pregnancy, unspecified trimester: Secondary | ICD-10-CM

## 2017-10-25 LAB — CBC
HCT: 32.5 % — ABNORMAL LOW (ref 36.0–46.0)
HCT: 31.6 % — ABNORMAL LOW (ref 36.0–46.0)
Hemoglobin: 11.1 g/dL — ABNORMAL LOW (ref 12.0–15.0)
Hemoglobin: 10.5 g/dL — ABNORMAL LOW (ref 12.0–15.0)
MCH: 27.9 pg (ref 26.0–34.0)
MCH: 27.1 pg (ref 26.0–34.0)
MCHC: 34.2 g/dL (ref 30.0–36.0)
MCHC: 33.2 g/dL (ref 30.0–36.0)
MCV: 81.7 fL (ref 78.0–100.0)
MCV: 81.7 fL (ref 78.0–100.0)
PLATELETS: 191 10*3/uL (ref 150–400)
PLATELETS: 189 10*3/uL (ref 150–400)
RBC: 3.98 MIL/uL (ref 3.87–5.11)
RBC: 3.87 MIL/uL (ref 3.87–5.11)
RDW: 13.8 % (ref 11.5–15.5)
RDW: 13.9 % (ref 11.5–15.5)
WBC: 10.8 10*3/uL — ABNORMAL HIGH (ref 4.0–10.5)
WBC: 11.1 10*3/uL — ABNORMAL HIGH (ref 4.0–10.5)

## 2017-10-25 LAB — COMPREHENSIVE METABOLIC PANEL
ALBUMIN: 3 g/dL — AB (ref 3.5–5.0)
ALT: 12 U/L — AB (ref 14–54)
AST: 20 U/L (ref 15–41)
Alkaline Phosphatase: 386 U/L — ABNORMAL HIGH (ref 38–126)
Anion gap: 12 (ref 5–15)
BILIRUBIN TOTAL: 0.2 mg/dL — AB (ref 0.3–1.2)
BUN: 7 mg/dL (ref 6–20)
CHLORIDE: 106 mmol/L (ref 101–111)
CO2: 17 mmol/L — ABNORMAL LOW (ref 22–32)
CREATININE: 0.53 mg/dL (ref 0.44–1.00)
Calcium: 8.7 mg/dL — ABNORMAL LOW (ref 8.9–10.3)
GFR calc Af Amer: >60 mL/min (ref >60)
GFR calc non Af Amer: >60 mL/min (ref >60)
GLUCOSE: 130 mg/dL — AB (ref 65–99)
POTASSIUM: 3.7 mmol/L (ref 3.5–5.1)
Sodium: 135 mmol/L (ref 135–145)
TOTAL PROTEIN: 6.4 g/dL — AB (ref 6.5–8.1)

## 2017-10-25 LAB — RPR: RPR: NONREACTIVE

## 2017-10-25 LAB — TYPE AND SCREEN
ABO/RH(D): A POS
Antibody Screen: NEGATIVE

## 2017-10-25 MED ORDER — LABETALOL HCL 200 MG PO TABS
200.0000 mg | ORAL_TABLET | Freq: Three times a day (TID) | ORAL | Status: DC
  Administered 2017-10-25 – 2017-10-27 (×5): 200 mg via ORAL
  Filled 2017-10-25 (×5): qty 1

## 2017-10-25 MED ORDER — ACETAMINOPHEN 325 MG PO TABS
650.0000 mg | ORAL_TABLET | ORAL | Status: DC | PRN
  Administered 2017-10-25 – 2017-10-27 (×6): 650 mg via ORAL
  Filled 2017-10-25 (×6): qty 2

## 2017-10-25 MED ORDER — LIDOCAINE HCL (PF) 1 % IJ SOLN
INTRAMUSCULAR | Status: DC | PRN
  Administered 2017-10-25 (×2): 5 mL via EPIDURAL

## 2017-10-25 MED ORDER — HYDRALAZINE HCL 20 MG/ML IJ SOLN: 10.0000 mg | Freq: Once | INTRAMUSCULAR | Status: DC | PRN

## 2017-10-25 MED ORDER — ONDANSETRON HCL 4 MG/2ML IJ SOLN: 4.0000 mg | INTRAMUSCULAR | Status: DC | PRN

## 2017-10-25 MED ORDER — LABETALOL HCL 5 MG/ML IV SOLN
INTRAVENOUS | Status: AC
  Filled 2017-10-25: qty 4

## 2017-10-25 MED ORDER — LACTATED RINGERS IV SOLN: 500.0000 mL | INTRAVENOUS | Status: DC | PRN

## 2017-10-25 MED ORDER — OXYCODONE-ACETAMINOPHEN 5-325 MG PO TABS: 2.0000 | ORAL_TABLET | ORAL | Status: DC | PRN

## 2017-10-25 MED ORDER — MISOPROSTOL 200 MCG PO TABS
ORAL_TABLET | ORAL | Status: AC
  Administered 2017-10-25: 800 ug via ORAL
  Filled 2017-10-25: qty 5

## 2017-10-25 MED ORDER — EPHEDRINE 5 MG/ML INJ
10.0000 mg | INTRAVENOUS | Status: DC | PRN
  Filled 2017-10-25: qty 2

## 2017-10-25 MED ORDER — COCONUT OIL OIL: 1.0000 "application " | TOPICAL_OIL | Status: DC | PRN

## 2017-10-25 MED ORDER — MISOPROSTOL 25 MCG QUARTER TABLET
25.0000 ug | ORAL_TABLET | ORAL | Status: DC | PRN
  Administered 2017-10-25: 25 ug via VAGINAL
  Filled 2017-10-25: qty 1

## 2017-10-25 MED ORDER — ZOLPIDEM TARTRATE 5 MG PO TABS: 5.0000 mg | ORAL_TABLET | Freq: Every evening | ORAL | Status: DC | PRN

## 2017-10-25 MED ORDER — ACETAMINOPHEN 325 MG PO TABS: 650.0000 mg | ORAL_TABLET | ORAL | Status: DC | PRN

## 2017-10-25 MED ORDER — DIBUCAINE 1 % RE OINT: 1.0000 "application " | TOPICAL_OINTMENT | RECTAL | Status: DC | PRN

## 2017-10-25 MED ORDER — DIPHENHYDRAMINE HCL 25 MG PO CAPS: 25.0000 mg | ORAL_CAPSULE | Freq: Four times a day (QID) | ORAL | Status: DC | PRN

## 2017-10-25 MED ORDER — OXYTOCIN 40 UNITS IN LACTATED RINGERS INFUSION - SIMPLE MED
1.0000 m[IU]/min | INTRAVENOUS | Status: DC
  Administered 2017-10-25: 2 m[IU]/min via INTRAVENOUS
  Filled 2017-10-25: qty 1000

## 2017-10-25 MED ORDER — FENTANYL 2.5 MCG/ML BUPIVACAINE 1/10 % EPIDURAL INFUSION (WH - ANES)
14.0000 mL/h | INTRAMUSCULAR | Status: DC | PRN
  Administered 2017-10-25: 14 mL/h via EPIDURAL
  Filled 2017-10-25: qty 100

## 2017-10-25 MED ORDER — DIPHENHYDRAMINE HCL 50 MG/ML IJ SOLN: 12.5000 mg | INTRAMUSCULAR | Status: DC | PRN

## 2017-10-25 MED ORDER — MISOPROSTOL 200 MCG PO TABS
800.0000 ug | ORAL_TABLET | Freq: Once | ORAL | Status: AC
  Administered 2017-10-25: 800 ug via ORAL

## 2017-10-25 MED ORDER — PHENYLEPHRINE 40 MCG/ML (10ML) SYRINGE FOR IV PUSH (FOR BLOOD PRESSURE SUPPORT)
80.0000 ug | PREFILLED_SYRINGE | INTRAVENOUS | Status: DC | PRN
  Filled 2017-10-25: qty 5

## 2017-10-25 MED ORDER — WITCH HAZEL-GLYCERIN EX PADS: 1.0000 "application " | MEDICATED_PAD | CUTANEOUS | Status: DC | PRN

## 2017-10-25 MED ORDER — LACTATED RINGERS IV SOLN
500.0000 mL | Freq: Once | INTRAVENOUS | Status: AC
  Administered 2017-10-25: 500 mL via INTRAVENOUS

## 2017-10-25 MED ORDER — SIMETHICONE 80 MG PO CHEW: 80.0000 mg | CHEWABLE_TABLET | ORAL | Status: DC | PRN

## 2017-10-25 MED ORDER — PRENATAL MULTIVITAMIN CH
1.0000 | ORAL_TABLET | Freq: Every day | ORAL | Status: DC
  Administered 2017-10-27: 1 via ORAL
  Filled 2017-10-25 (×2): qty 1

## 2017-10-25 MED ORDER — ONDANSETRON HCL 4 MG/2ML IJ SOLN
4.0000 mg | Freq: Four times a day (QID) | INTRAMUSCULAR | Status: DC | PRN
  Administered 2017-10-25: 4 mg via INTRAVENOUS
  Filled 2017-10-25: qty 2

## 2017-10-25 MED ORDER — LIDOCAINE HCL (PF) 1 % IJ SOLN
30.0000 mL | INTRAMUSCULAR | Status: DC | PRN
  Filled 2017-10-25: qty 30

## 2017-10-25 MED ORDER — LACTATED RINGERS IV SOLN
INTRAVENOUS | Status: DC
  Administered 2017-10-25: 08:00:00 via INTRAVENOUS

## 2017-10-25 MED ORDER — OXYCODONE-ACETAMINOPHEN 5-325 MG PO TABS: 1.0000 | ORAL_TABLET | ORAL | Status: DC | PRN

## 2017-10-25 MED ORDER — BENZOCAINE-MENTHOL 20-0.5 % EX AERO
1.0000 "application " | INHALATION_SPRAY | CUTANEOUS | Status: DC | PRN
  Administered 2017-10-25: 1 via TOPICAL
  Filled 2017-10-25: qty 56

## 2017-10-25 MED ORDER — FLEET ENEMA 7-19 GM/118ML RE ENEM: 1.0000 | ENEMA | RECTAL | Status: DC | PRN

## 2017-10-25 MED ORDER — FENTANYL CITRATE (PF) 100 MCG/2ML IJ SOLN
100.0000 ug | INTRAMUSCULAR | Status: DC | PRN
  Administered 2017-10-25: 100 ug via INTRAVENOUS
  Filled 2017-10-25: qty 2

## 2017-10-25 MED ORDER — SENNOSIDES-DOCUSATE SODIUM 8.6-50 MG PO TABS
2.0000 | ORAL_TABLET | ORAL | Status: DC
  Administered 2017-10-25 – 2017-10-27 (×2): 2 via ORAL
  Filled 2017-10-25 (×2): qty 2

## 2017-10-25 MED ORDER — OXYTOCIN BOLUS FROM INFUSION
500.0000 mL | Freq: Once | INTRAVENOUS | Status: DC
  Administered 2017-10-25: 500 mL via INTRAVENOUS

## 2017-10-25 MED ORDER — LABETALOL HCL 200 MG PO TABS
200.0000 mg | ORAL_TABLET | Freq: Three times a day (TID) | ORAL | Status: DC
  Administered 2017-10-25 (×2): 200 mg via ORAL
  Filled 2017-10-25 (×2): qty 1

## 2017-10-25 MED ORDER — OXYTOCIN 40 UNITS IN LACTATED RINGERS INFUSION - SIMPLE MED
2.5000 [IU]/h | INTRAVENOUS | Status: DC
  Filled 2017-10-25: qty 1000

## 2017-10-25 MED ORDER — LABETALOL HCL 5 MG/ML IV SOLN
20.0000 mg | INTRAVENOUS | Status: DC | PRN
  Administered 2017-10-25: 20 mg via INTRAVENOUS

## 2017-10-25 MED ORDER — TETANUS-DIPHTH-ACELL PERTUSSIS 5-2.5-18.5 LF-MCG/0.5 IM SUSP: 0.5000 mL | Freq: Once | INTRAMUSCULAR | Status: DC

## 2017-10-25 MED ORDER — ONDANSETRON HCL 4 MG PO TABS: 4.0000 mg | ORAL_TABLET | ORAL | Status: DC | PRN

## 2017-10-25 MED ORDER — PHENYLEPHRINE 40 MCG/ML (10ML) SYRINGE FOR IV PUSH (FOR BLOOD PRESSURE SUPPORT)
80.0000 ug | PREFILLED_SYRINGE | INTRAVENOUS | Status: DC | PRN
Start: 2017-10-25 — End: 2017-10-25
  Filled 2017-10-25: qty 10
  Filled 2017-10-25: qty 5

## 2017-10-25 MED ORDER — SOD CITRATE-CITRIC ACID 500-334 MG/5ML PO SOLN: 30.0000 mL | ORAL | Status: DC | PRN

## 2017-10-25 MED ORDER — TERBUTALINE SULFATE 1 MG/ML IJ SOLN
0.2500 mg | Freq: Once | INTRAMUSCULAR | Status: DC | PRN
  Filled 2017-10-25: qty 1

## 2017-10-25 MED ORDER — LABETALOL HCL 5 MG/ML IV SOLN
20.0000 mg | INTRAVENOUS | Status: DC | PRN
  Administered 2017-10-25: 20 mg via INTRAVENOUS
  Filled 2017-10-25: qty 4

## 2017-10-25 MED ORDER — IBUPROFEN 600 MG PO TABS
600.0000 mg | ORAL_TABLET | Freq: Four times a day (QID) | ORAL | Status: DC
  Administered 2017-10-25 – 2017-10-27 (×7): 600 mg via ORAL
  Filled 2017-10-25 (×8): qty 1

## 2017-10-25 NOTE — H&P (Addendum)
LABOR AND DELIVERY ADMISSION HISTORY AND PHYSICAL NOTE  Megan Hunt is a 32 y.o. female [phone] with IUP at [redacted]w[redacted]d by LMP presenting for IOL for cHTN.  She reports positive fetal movement. She denies leakage of fluid or vaginal bleeding.  Prenatal History/Complications: PNC at [facility] Pregnancy complications:  - cHTN on labetalol 200 mg TID - anxiety/depression - mild intermittent asthma  Past Medical History: Past Medical History:  Diagnosis Date  . Allergic rhinitis   . Allergy   . Anemia   . Anxiety   . Anxiety and depression   . Asthma   . Asthma, mild intermittent 10/27/2008   Qualifier: Diagnosis of  By: Kriste Basque MD, Lonzo Cloud   . Atypical chest pain   . Back pain   . Constipation   . History of migraines   . Hypercholesteremia   . Hypertension    no longer taking meds  . Insomnia 05/15/2016  . Somatic dysfunction     Past Surgical History: Past Surgical History:  Procedure Laterality Date  . WISDOM TOOTH EXTRACTION      Obstetrical History: OB History    Gravida  4   Para  3   Term  3   Preterm      AB      Living  3     SAB      TAB      Ectopic      Multiple      Live Births  3           Social History: Social History   Socioeconomic History  . Marital status: Married    Spouse name: Not on file  . Number of children: 2  . Years of education: Not on file  . Highest education level: Not on file  Occupational History  . 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 Financial resource strain: Not on file  . Food insecurity:    Worry: Not on file    Inability: Not on file  . Transportation needs:    Medical: Not on file    Non-medical: Not on file  Tobacco Use  . Smoking status: Never Smoker  . Smokeless tobacco: Never Used  Substance and Sexual Activity  . Alcohol use: No  . Drug use: No  . Sexual activity: Not Currently    Birth  control/protection: None    Comment: lives with husband and 3 small children, no dietary restrictions, stays at home  Lifestyle  . Physical activity:    Days per week: Not on file    Minutes per session: Not on file  . Stress: Not on file  Relationships  . Social connections:    Talks on phone: Not on file    Gets together: Not on file    Attends religious service: Not on file    Active member of club or organization: Not on file    Attends meetings of clubs or organizations: Not on file    Relationship status: Not on file  Other Topics Concern  . Not on file  Social History Narrative   Son [name] born 02/2008 and daughter [name] born 06/2009   Ivin Booty born 03/22/2013    Family History: Family History  Problem Relation Age of Onset  . Hypertension Father   . Hypertension Mother   . Arthritis Brother   . Stroke Maternal Grandfather   .  Hypertension Maternal Grandfather     Allergies: No Known Allergies  Medications Prior to Admission  Medication Sig Dispense Refill Last Dose  . labetalol (NORMODYNE) 200 MG tablet Take 1 tablet (200 mg total) by mouth every 8 (eight) hours. (Patient taking differently: Take 200 mg by mouth 3 (three) times daily. ) 60 tablet 0 10/24/2017 at 1800     Review of Systems  All systems reviewed and negative except as stated in HPI  Physical Exam Blood pressure (!) 142/81, pulse 86, temperature 97.7 F (36.5 C), temperature source Oral, resp. rate 18, height 5\' 4"  (1.626 m), weight 96.2 kg (212 lb), last menstrual period 01/25/2017. General appearance: alert, oriented, NAD Lungs: normal respiratory effort Heart: regular rate Abdomen: soft, non-tender; gravid, FH appropriate for GA Extremities: No calf swelling or tenderness Presentation: cephalic Fetal monitoring: 130 bpm/moderate variability/+ accel, no decel Uterine activity: irregular Dilation: 2 Effacement (%): 40 Station: -3 Exam by:: Marcelino Duster, RN   Prenatal labs: ABO, Rh: --/--/A  POS (06/18 0815) Antibody: NEG (06/18 0815) Rubella: 2.51 (01/23 1541) RPR: Non Reactive (03/21 0935)  HBsAg: Negative (01/23 1541)  HIV: Non Reactive (03/21 0935)  GC/Chlamydia: negative GBS: Negative (05/30 0000)  2-hr GTT: negative (78/118/118) Genetic screening:  declined Anatomy US: normal   Prenatal Transfer Tool  Maternal Diabetes: No Genetic Screening: Declined Maternal Ultrasounds/Referrals: Normal Fetal Ultrasounds or other Referrals:  None Maternal Substance Abuse:  No Significant Maternal Medications:  Meds include: Other: Labetalol  Significant Maternal Lab Results: None  Results for orders placed or performed during the hospital encounter of 10/25/17 (from the past 24 hour(s))  CBC   Collection Time: 10/25/17  8:15 AM  Result Value Ref Range   WBC 10.8 (H) 4.0 - 10.5 K/uL   RBC 3.98 3.87 - 5.11 MIL/uL   Hemoglobin 11.1 (L) 12.0 - 15.0 g/dL   HCT 40.9 (L) 81.1 - 91.4 %   MCV 81.7 78.0 - 100.0 fL   MCH 27.9 26.0 - 34.0 pg   MCHC 34.2 30.0 - 36.0 g/dL   RDW 78.2 95.6 - 21.3 %   Platelets 191 150 - 400 K/uL  Type and screen [facility]   Collection Time: 10/25/17  8:15 AM  Result Value Ref Range   ABO/RH(D) A POS    Antibody Screen NEG    Sample Expiration      10/28/2017 Performed at [facility], [address] [place], [place] [address]     Patient Active Problem List   Diagnosis Date Noted  . Chronic hypertension affecting pregnancy 10/25/2017  . Supervision of other normal pregnancy, antepartum 06/01/2017  . Depression affecting pregnancy, antepartum 06/01/2017  . Asthma affecting pregnancy, antepartum 06/01/2017  . Benign essential hypertension affecting pregnancy, antepartum 06/01/2017  . Insomnia 05/15/2016  . Sinusitis, acute maxillary 08/08/2014  . Fatigue 08/08/2014  . Hemorrhoids, external 01/05/2011  . Fibromyalgia 01/05/2011  . Constipation 01/03/2010  . Essential hypertension 09/29/2009  . HYPERCHOLESTEROLEMIA  10/27/2008  . ANXIETY DEPRESSION 10/27/2008  . Allergic rhinitis 10/27/2008  . Asthma, mild intermittent 10/27/2008  . Backache 10/27/2008  . SOMATIC DYSFUNCTION 10/27/2008  . CHEST PAIN, ATYPICAL 10/27/2008  . Migraine 03/29/2007    Assessment: Megan Hunt is a 32 y.o. G4P3003 at [redacted]w[redacted]d here for IOL for cHTN.  #Labor: SVE on admission 2/40/-3. Cytotec placed x 1. Plan for pitocin when next dose due. Anticipate SVD.  #cHTN: on labetolol 200 TID; continue while in latent labor, IV ordered prn.   #Pain: Per  patient request #FWB: Cat 1FHT #ID:  GBS negative #MOF: breast #MOC:partner vasectomy #Circ:  N/a - female  GrenadaBrittany P Hipkins 10/25/2017, 10:08 AM  I confirm that I have verified the information documented in the resident's note and that I have also personally reperformed the physical exam and all medical decision making activities.  Sharyon CableVeronica C Syed Zukas, CNM 10/25/17, 1:51 PM

## 2017-10-25 NOTE — Anesthesia Preprocedure Evaluation (Signed)
Anesthesia Evaluation  Patient identified by MRN, date of birth, ID band Patient awake    Reviewed: Allergy & Precautions, NPO status , Patient's Chart, lab work & pertinent test results  Airway Mallampati: II  TM Distance: >3 FB Neck ROM: Full    Dental no notable dental hx.    Pulmonary asthma ,    Pulmonary exam normal breath sounds clear to auscultation       Cardiovascular hypertension, Pt. on medications Normal cardiovascular exam Rhythm:Regular Rate:Normal     Neuro/Psych negative neurological ROS  negative psych ROS   GI/Hepatic negative GI ROS, Neg liver ROS,   Endo/Other  negative endocrine ROS  Renal/GU negative Renal ROS  negative genitourinary   Musculoskeletal negative musculoskeletal ROS (+)   Abdominal   Peds negative pediatric ROS (+)  Hematology negative hematology ROS (+)   Anesthesia Other Findings   Reproductive/Obstetrics (+) Pregnancy                             Anesthesia Physical Anesthesia Plan  ASA: II  Anesthesia Plan: Epidural   Post-op Pain Management:    Induction:   PONV Risk Score and Plan:   Airway Management Planned:   Additional Equipment:   Intra-op Plan:   Post-operative Plan:   Informed Consent: I have reviewed the patients History and Physical, chart, labs and discussed the procedure including the risks, benefits and alternatives for the proposed anesthesia with the patient or authorized representative who has indicated his/her understanding and acceptance.     Plan Discussed with:   Anesthesia Plan Comments:         Anesthesia Quick Evaluation

## 2017-10-25 NOTE — Progress Notes (Signed)
S: Called by RN upon transfer to mother-baby unit that Ms. Dada felt rectal pressure upon arrival at the floor and several large clots were expelled.   O: HR 84, BP 153/87, RR 18 General: no acute distress Abdomen: fundus firm below the umbilicus GU: several large clots on pad with slow trickle coming from vagina  A/P: Patient with continued postpartum bleeding - she is a G4 now P4004 s/p SVD earlier today that wa precipitous. No history of PPH. Hgb on admission was 10.5. EBL during delivery was 150 cc.  -after manual sweep and fundal pressure, total EBL from clots and continued bleeding = 896 cc -after manual sweep and 800 mcg buccal cytotec bleeding slowed and no further clots, continue to monitor -will repeat Hgb tomorrow AM

## 2017-10-25 NOTE — Anesthesia Procedure Notes (Signed)
Epidural Patient location during procedure: OB  Staffing Anesthesiologist: Almadelia Looman, MD Performed: anesthesiologist   Preanesthetic Checklist Completed: patient identified, site marked, surgical consent, pre-op evaluation, timeout performed, IV checked, risks and benefits discussed and monitors and equipment checked  Epidural Patient position: sitting Prep: DuraPrep Patient monitoring: heart rate, continuous pulse ox and blood pressure Approach: right paramedian Location: L3-L4 Injection technique: LOR saline  Needle:  Needle type: Tuohy  Needle gauge: 17 G Needle length: 9 cm and 9 Needle insertion depth: 7 cm Catheter type: closed end flexible Catheter size: 20 Guage Catheter at skin depth: 12 cm Test dose: negative  Assessment Events: blood not aspirated, injection not painful, no injection resistance, negative IV test and no paresthesia  Additional Notes Patient identified. Risks/Benefits/Options discussed with patient including but not limited to bleeding, infection, nerve damage, paralysis, failed block, incomplete pain control, headache, blood pressure changes, nausea, vomiting, reactions to medication both or allergic, itching and postpartum back pain. Confirmed with bedside nurse the patient's most recent platelet count. Confirmed with patient that they are not currently taking any anticoagulation, have any bleeding history or any family history of bleeding disorders. Patient expressed understanding and wished to proceed. All questions were answered. Sterile technique was used throughout the entire procedure. Please see nursing notes for vital signs. Test dose was given through epidural needle and negative prior to continuing to dose epidural or start infusion. Warning signs of high block given to the patient including shortness of breath, tingling/numbness in hands, complete motor block, or any concerning symptoms with instructions to call for help. Patient was given  instructions on fall risk and not to get out of bed. All questions and concerns addressed with instructions to call with any issues.     

## 2017-10-25 NOTE — Progress Notes (Addendum)
Received pt to room 120 at 1530. Pt was cramping pain of 10. Noted FF @U  large clots. Dr Thad RangerHipkin was notified and came to evaluate pt. RN Martyn MalayMichelle Moore L&D stayed with pt for evaluation and transition. Totol EBL 839, Cytotec 800mcg bucal given. Pitocin IV bolus infusing. Labetalol 200 mg PO for BP 154/95, 151/10/, 163/90. At 1630 pt became shivery and cold BP 165/92 HR 100 Oxygen sat 100-FF@U  small lochia. Truddie CrumbleVeronica Roger CNM notified of BP and shivering. Labetalol order set to follow.

## 2017-10-25 NOTE — Lactation Note (Signed)
This note was copied from a baby's chart. Lactation Consultation Note  Patient Name: Megan Hunt Gotts JXBJY'NToday's Date: 10/25/2017 Reason for consult: Initial assessment;Term  P4 mother whose infant is now 448 hours old.    Upon reviewing flowsheet I noticed that mother's preference upon admission was to breast/bottle feed.  However, she has only bottle fed since delivery.  I wanted to obtain more information so I could best assist her with her feeding plan.  Mother did not seem very interested at all in breastfeeding.  We discussed this possibility and the importance of putting baby to breast on a regular basis to initiate milk production.  I wanted to let her know I would support whatever decision she made, but, it may be best to attempt breastfeeding here where I can assist as needed.  Mother reluctantly agreed and stated she would try breastfeeding the next time baby shows cues.  Encouraged feeding 8-12 times/24 hours or more if baby shows cues.  Mother will call me for assistance if needed.  Advised her to let me know if she decides to exclusively bottle feed.  Mother verbalized importance of beginning breastfeeding.                Consult Status Consult Status: Follow-up Date: 10/26/17 Follow-up type: In-patient    Megan Hunt 10/25/2017, 9:54 PM

## 2017-10-25 NOTE — Anesthesia Postprocedure Evaluation (Signed)
Anesthesia Post Note  Patient: Megan Hunt  Procedure(s) Performed: AN AD HOC LABOR EPIDURAL     Patient location during evaluation: Mother Baby Anesthesia Type: Epidural Level of consciousness: awake and alert and oriented Pain management: satisfactory to patient Vital Signs Assessment: post-procedure vital signs reviewed and stable Respiratory status: respiratory function stable Cardiovascular status: stable Postop Assessment: no headache, no backache, epidural receding, patient able to bend at knees, no signs of nausea or vomiting and adequate PO intake Anesthetic complications: no    Last Vitals:  Vitals:   10/25/17 1800 10/25/17 1814  BP: (!) 143/78 136/68  Pulse: 89 89  Resp: 18 18  Temp:    SpO2: 100% 100%    Last Pain:  Vitals:   10/25/17 1739  TempSrc: Oral  PainSc:    Pain Goal: Patients Stated Pain Goal: 3 (10/25/17 1615)               Karleen DolphinFUSSELL,Tecla Mailloux

## 2017-10-25 NOTE — Progress Notes (Signed)
MOB was referred for history of depression/anxiety. * Referral screened out by Clinical Social Worker because none of the following criteria appear to apply: ~ History of anxiety/depression during this pregnancy, or of post-partum depression. ~ Diagnosis of anxiety and/or depression within last 3 years OR * MOB's symptoms currently being treated with medication and/or therapy. Prior to pregnancy MOB symptoms were managed with Rx. Please contact the Clinical Social Worker if needs arise, by MOB request, or if MOB scores greater than 9/yes to question 10 on Edinburgh Postpartum Depression Screen.  Megan Hunt, MSW, LCSW Clinical Social Work [phone] 

## 2017-10-26 LAB — HEMOGLOBIN AND HEMATOCRIT, BLOOD
HCT: 25.7 % — ABNORMAL LOW (ref 36.0–46.0)
Hemoglobin: 8.5 g/dL — ABNORMAL LOW (ref 12.0–15.0)

## 2017-10-26 MED ORDER — OXYCODONE-ACETAMINOPHEN 5-325 MG PO TABS
1.0000 | ORAL_TABLET | Freq: Four times a day (QID) | ORAL | Status: DC | PRN
  Administered 2017-10-26: 1 via ORAL
  Filled 2017-10-26: qty 1

## 2017-10-27 MED ORDER — AMLODIPINE BESYLATE 5 MG PO TABS: 5.0000 mg | ORAL_TABLET | Freq: Every day | ORAL | 3 refills | Status: DC

## 2017-10-27 MED ORDER — IBUPROFEN 600 MG PO TABS: 600.0000 mg | ORAL_TABLET | Freq: Four times a day (QID) | ORAL | 0 refills | Status: DC

## 2017-10-27 MED ORDER — FERROUS SULFATE 300 (60 FE) MG/5ML PO SYRP: 300.0000 mg | ORAL_SOLUTION | Freq: Two times a day (BID) | ORAL | 3 refills | Status: DC

## 2017-10-27 NOTE — Progress Notes (Signed)
Post Partum Day 2 Subjective: no complaints, up ad lib, voiding, tolerating PO and + flatus  Objective: Blood pressure (!) 147/87, pulse 86, temperature 97.8 F (36.6 C), resp. rate (!) 22, height 5\' 4"  (1.626 m), weight 212 lb (96.2 kg), last menstrual period 01/25/2017, SpO2 100 %, unknown if currently breastfeeding.  Physical Exam:  General: alert, cooperative and no distress Lochia: appropriate Uterine Fundus: firm DVT Evaluation: No evidence of DVT seen on physical exam. Negative Homan's sign. No cords or calf tenderness. No significant calf/ankle edema.  Recent Labs    10/25/17 1459 10/26/17 0535  HGB 10.5* 8.5*  HCT 31.6* 25.7*    Assessment/Plan: Discharge home   LOS: 2 days   Raelyn Moraolitta Maisen Schmit, CNM 10/27/2017, 10:48 AM

## 2017-10-27 NOTE — Discharge Summary (Addendum)
OB Discharge Summary     Patient Name: Megan Hunt DOB: [birth year] MRN: [id number]  Date of admission: 10/25/2017 Delivering MD: Levie Heritage   Date of discharge: 10/27/2017  Admitting diagnosis: INDUCTION Intrauterine pregnancy: [redacted]w[redacted]d     Secondary diagnosis:  Active Problems:   Chronic hypertension affecting pregnancy   Spontaneous vaginal delivery  Additional problems: none     Discharge diagnosis: Postpartum Hemorhage                                                                                                Post partum procedures:manual removal of 896 ml worth of blood and large clots, cytotec 800 mcg buccally  Augmentation: none  Complications: None  Hospital course:  Onset of Labor With Vaginal Delivery     32 y.o. yo [id number] at [redacted]w[redacted]d was admitted in Active Labor on 10/25/2017. Patient had an uncomplicated labor course as follows:  Membrane Rupture Time/Date: 1:42 PM ,10/25/2017   Intrapartum Procedures: Episiotomy: None [1]                                         Lacerations:  None [1]  Patient had a delivery of a Viable infant. 10/25/2017  Information for the patient's newborn:  Arien, Morine Girl Toneka [[id number]]  Delivery Method: Vaginal, Spontaneous(Filed from Delivery Summary)    Pateint had an uncomplicated postpartum course.  She is ambulating, tolerating a regular diet, passing flatus, and urinating well. Patient is discharged home in stable condition on 10/27/17.   Physical exam  Vitals:   10/27/17 0043 10/27/17 0145 10/27/17 0504 10/27/17 0846  BP: (!) 144/79 138/72 138/70 (!) 147/87  Pulse: 92 90 82 86  Resp:      Temp:   97.8 F (36.6 C)   TempSrc:      SpO2:   100%   Weight:      Height:       General: alert, cooperative and no distress Lochia: appropriate Uterine Fundus: firm, U-2 DVT Evaluation: No evidence of DVT seen on physical exam. Negative Homan's sign. No cords or calf tenderness. No significant calf/ankle edema. Labs: Lab  Results  Component Value Date   WBC 11.1 (H) 10/25/2017   HGB 8.5 (L) 10/26/2017   HCT 25.7 (L) 10/26/2017   MCV 81.7 10/25/2017   PLT 189 10/25/2017   CMP Latest Ref Rng & Units 10/25/2017  Glucose 65 - 99 mg/dL 063(K)  BUN 6 - 20 mg/dL 7  Creatinine 1.60 - 1.09 mg/dL 3.23  Sodium 557 - 322 mmol/L 135  Potassium 3.5 - 5.1 mmol/L 3.7  Chloride 101 - 111 mmol/L 106  CO2 22 - 32 mmol/L 17(L)  Calcium 8.9 - 10.3 mg/dL 0.2(R)  Total Protein 6.5 - 8.1 g/dL 6.4(L)  Total Bilirubin 0.3 - 1.2 mg/dL 4.2(H)  Alkaline Phos 38 - 126 U/L 386(H)  AST 15 - 41 U/L 20  ALT 14 - 54 U/L 12(L)    Discharge instruction: per After Visit Summary and "Baby and  Me Booklet".  After visit meds:  Allergies as of 10/27/2017   No Known Allergies     Medication List    STOP taking these medications   labetalol 200 MG tablet Commonly known as:  NORMODYNE     TAKE these medications   amLODipine 5 MG tablet Commonly known as:  NORVASC Take 1 tablet (5 mg total) by mouth daily.   ferrous sulfate 300 (60 Fe) MG/5ML syrup Take 5 mLs (300 mg total) by mouth 2 (two) times daily with a meal.   ibuprofen 600 MG tablet Commonly known as:  ADVIL,MOTRIN Take 1 tablet (600 mg total) by mouth every 6 (six) hours.       Diet: routine diet  Activity: Advance as tolerated. Pelvic rest for 6 weeks.   Outpatient follow up:1 week for BP check and 6 weeks for PP visit Follow up Appt: Future Appointments  Date Time Provider Department Center  11/24/2017  1:30 PM Bradd CanaryBlyth, Stacey A, MD [facility]  11/28/2017  9:30 AM Levie HeritageStinson, Jacob J, DO [facility] None   Follow up Visit:No follow-ups on file.  Postpartum contraception: Vasectomy  Newborn Data: Live born female "Joi" Birth Weight: 6 lb 6.5 oz (2906 g) APGAR: 9, 9  Newborn Delivery   Birth date/time:  10/25/2017 13:43:00 Delivery type:  Vaginal, Spontaneous     Baby Feeding: Breast Disposition:home with mother   10/27/2017 Megan Hunt, CNM

## 2017-11-02 ENCOUNTER — Ambulatory Visit (INDEPENDENT_AMBULATORY_CARE_PROVIDER_SITE_OTHER): Payer: 59 | Admitting: Family Medicine

## 2017-11-02 ENCOUNTER — Encounter (HOSPITAL_COMMUNITY): Payer: Self-pay

## 2017-11-02 ENCOUNTER — Other Ambulatory Visit: Payer: Self-pay

## 2017-11-02 ENCOUNTER — Inpatient Hospital Stay (HOSPITAL_COMMUNITY)
Admission: AD | Admit: 2017-11-02 | Discharge: 2017-11-02 | Disposition: A | Discharge status: Home / Self Care | Payer: 59 | Source: Ambulatory Visit | Attending: Obstetrics & Gynecology | Admitting: Obstetrics & Gynecology

## 2017-11-02 DIAGNOSIS — O1495 Unspecified pre-eclampsia, complicating the puerperium: Secondary | ICD-10-CM | POA: Insufficient documentation

## 2017-11-02 DIAGNOSIS — O165 Unspecified maternal hypertension, complicating the puerperium: Secondary | ICD-10-CM | POA: Diagnosis not present

## 2017-11-02 DIAGNOSIS — F419 Anxiety disorder, unspecified: Secondary | ICD-10-CM | POA: Diagnosis not present

## 2017-11-02 DIAGNOSIS — Z79899 Other long term (current) drug therapy: Secondary | ICD-10-CM | POA: Diagnosis not present

## 2017-11-02 DIAGNOSIS — F329 Major depressive disorder, single episode, unspecified: Secondary | ICD-10-CM | POA: Diagnosis not present

## 2017-11-02 DIAGNOSIS — Z8249 Family history of ischemic heart disease and other diseases of the circulatory system: Secondary | ICD-10-CM | POA: Insufficient documentation

## 2017-11-02 DIAGNOSIS — O99345 Other mental disorders complicating the puerperium: Secondary | ICD-10-CM | POA: Insufficient documentation

## 2017-11-02 DIAGNOSIS — R51 Headache: Secondary | ICD-10-CM | POA: Diagnosis present

## 2017-11-02 LAB — COMPREHENSIVE METABOLIC PANEL
ALT: 34 U/L (ref 0–44)
AST: 23 U/L (ref 15–41)
Albumin: 3.4 g/dL — ABNORMAL LOW (ref 3.5–5.0)
Alkaline Phosphatase: 217 U/L — ABNORMAL HIGH (ref 38–126)
Anion gap: 11 (ref 5–15)
BUN: 11 mg/dL (ref 6–20)
CHLORIDE: 104 mmol/L (ref 98–111)
CO2: 21 mmol/L — AB (ref 22–32)
Calcium: 8.6 mg/dL — ABNORMAL LOW (ref 8.9–10.3)
Creatinine, Ser: 0.7 mg/dL (ref 0.44–1.00)
GFR calc Af Amer: >60 mL/min (ref >60)
Glucose, Bld: 103 mg/dL — ABNORMAL HIGH (ref 70–99)
POTASSIUM: 4.1 mmol/L (ref 3.5–5.1)
SODIUM: 136 mmol/L (ref 135–145)
Total Bilirubin: 0.6 mg/dL (ref 0.3–1.2)
Total Protein: 6.9 g/dL (ref 6.5–8.1)

## 2017-11-02 LAB — CBC
HCT: 31.2 % — ABNORMAL LOW (ref 36.0–46.0)
Hemoglobin: 10 g/dL — ABNORMAL LOW (ref 12.0–15.0)
MCH: 26.8 pg (ref 26.0–34.0)
MCHC: 32.1 g/dL (ref 30.0–36.0)
MCV: 83.6 fL (ref 78.0–100.0)
PLATELETS: 368 10*3/uL (ref 150–400)
RBC: 3.73 MIL/uL — AB (ref 3.87–5.11)
RDW: 13.8 % (ref 11.5–15.5)
WBC: 10.5 10*3/uL (ref 4.0–10.5)

## 2017-11-02 LAB — PROTEIN / CREATININE RATIO, URINE
Creatinine, Urine: 105 mg/dL
Protein Creatinine Ratio: 0.13 mg/mg{Cre} (ref 0.00–0.15)
TOTAL PROTEIN, URINE: 14 mg/dL

## 2017-11-02 MED ORDER — LACTATED RINGERS IV SOLN
INTRAVENOUS | Status: DC
  Administered 2017-11-02: 19:00:00 via INTRAVENOUS

## 2017-11-02 MED ORDER — HYDRALAZINE HCL 20 MG/ML IJ SOLN
5.0000 mg | INTRAMUSCULAR | Status: DC | PRN
  Administered 2017-11-02: 5 mg via INTRAVENOUS
  Filled 2017-11-02: qty 1

## 2017-11-02 MED ORDER — LABETALOL HCL 5 MG/ML IV SOLN: 20.0000 mg | INTRAVENOUS | Status: DC | PRN

## 2017-11-02 MED ORDER — AMLODIPINE BESYLATE 10 MG PO TABS: 10.0000 mg | ORAL_TABLET | Freq: Every day | ORAL | 3 refills | Status: DC

## 2017-11-02 MED ORDER — HYDROCHLOROTHIAZIDE 25 MG PO TABS: 25.0000 mg | ORAL_TABLET | Freq: Every day | ORAL | 30 refills | Status: DC

## 2017-11-02 NOTE — Progress Notes (Signed)
   Subjective:    Patient ID: Megan Hunt is a 32 y.o. female presenting with Blood Pressure Check  on 11/02/2017  HPI: Here today for BP check. Reports headache with spots in vision x 2 days. Has not resolved with Ibuprofen or tylenol. Taking Iron and Norvasc. Has diagnosis of CHTN. Did not receive Magnesium sulfate prophylaxis during delivery. Has h/o headaches, but do not feel like this and usually go away.  Review of Systems  Constitutional: Negative for chills and fever.  Respiratory: Negative for shortness of breath.   Cardiovascular: Negative for chest pain.  Gastrointestinal: Negative for abdominal pain, nausea and vomiting.  Genitourinary: Negative for dysuria.  Skin: Negative for rash.  Neurological: Positive for headaches.       Spots in vision      Objective:    BP 140/89   Pulse 93   Wt 199 lb 12.8 oz (90.6 kg)   LMP 01/25/2017   BMI 34.30 kg/m  Physical Exam  Constitutional: She is oriented to person, place, and time. She appears well-developed and well-nourished. No distress.  HENT:  Head: Normocephalic and atraumatic.  Eyes: No scleral icterus.  Neck: Neck supple.  Cardiovascular: Normal rate.  Pulmonary/Chest: Effort normal.  Abdominal: Soft.  Neurological: She is alert and oriented to person, place, and time.  Skin: Skin is warm and dry.  Psychiatric: She has a normal mood and affect.        Assessment & Plan:   Problem List Items Addressed This Visit      Unprioritized   Pre-eclampsia in postpartum period    Have advised need for [facility] eval due to headache and vision changes. Check labs and give meds. If headache persists, may need inpatient with magnesium. Patient reluctant to go to hospital due to cost. Advised risk of seizure and stroke. Spoke to [facility] and they are expecting her.         Total face-to-face time with patient: 15 minutes. Over 50% of encounter was spent on counseling and coordination of care. Return in about 2 days (around  11/04/2017) for BP check.  Reva Boresanya S Abdo Denault 11/02/2017 4:01 PM

## 2017-11-02 NOTE — MAU Note (Signed)
Pt states that she was in the office today and they sent her here because she is having a headache, seeing spots, and feeling dizzy

## 2017-11-02 NOTE — Discharge Instructions (Signed)
Postpartum Hypertension °Postpartum hypertension is high blood pressure after pregnancy that remains higher than normal for more than two days after delivery. You may not realize that you have postpartum hypertension if your blood pressure is not being checked regularly. In some cases, postpartum hypertension will go away on its own, usually within a week of delivery. However, for some women, medical treatment is required to prevent serious complications, such as seizures or stroke. °The following things can affect your blood pressure: °· The type of delivery you had. °· Having received IV fluids or other medicines during or after delivery. ° °What are the causes? °Postpartum hypertension may be caused by any of the following or by a combination of any of the following: °· Hypertension that existed before pregnancy (chronic hypertension). °· Gestational hypertension. °· Preeclampsia or eclampsia. °· Receiving a lot of fluid through an IV during or after delivery. °· Medicines. °· HELLP syndrome. °· Hyperthyroidism. °· Stroke. °· Other rare neurological or blood disorders. ° °In some cases, the cause may not be known. °What increases the risk? °Postpartum hypertension can be related to one or more risk factors, such as: °· Chronic hypertension. In some cases, this may not have been diagnosed before pregnancy. °· Obesity. °· Type 2 diabetes. °· Kidney disease. °· Family history of preeclampsia. °· Other medical conditions that cause hormonal imbalances. ° °What are the signs or symptoms? °As with all types of hypertension, postpartum hypertension may not have any symptoms. Depending on how high your blood pressure is, you may experience: °· Headaches. These may be mild, moderate, or severe. They may also be steady, constant, or sudden in onset (thunderclap headache). °· Visual changes. °· Dizziness. °· Shortness of breath. °· Swelling of your hands, feet, lower legs, or face. In some cases, you may have swelling in  more than one of these locations. °· Heart palpitations or a racing heartbeat. °· Difficulty breathing while lying down. °· Decreased urination. ° °Other rare signs and symptoms may include: °· Sweating more than usual. This lasts longer than a few days after delivery. °· Chest pain. °· Sudden dizziness when you get up from sitting or lying down. °· Seizures. °· Nausea or vomiting. °· Abdominal pain. ° °How is this diagnosed? °The diagnosis of postpartum hypertension is made through a combination of physical examination findings and testing of your blood and urine. You may also have additional tests, such as a CT scan or an MRI, to check for other complications of postpartum hypertension. °How is this treated? °When blood pressure is high enough to require treatment, your options may include: °· Medicines to reduce blood pressure (antihypertensives). Tell your health care provider if you are breastfeeding or if you plan to breastfeed. There are many antihypertensive medicines that are safe to take while breastfeeding. °· Stopping medicines that may be causing hypertension. °· Treating medical conditions that are causing hypertension. °· Treating the complications of hypertension, such as seizures, stroke, or kidney problems. ° °Your health care provider will also continue to monitor your blood pressure closely and repeatedly until it is within a safe range for you. °Follow these instructions at home: °· Take medicines only as directed by your health care provider. °· Get regular exercise after your health care provider tells you that it is safe. °· Follow your health care provider’s recommendations on fluid and salt restrictions. °· Do not use any tobacco products, including cigarettes, chewing tobacco, or electronic cigarettes. If you need help quitting, ask your health care provider. °·   Keep all follow-up visits as directed by your health care provider. This is important. °Contact a health care provider  if: °· Your symptoms get worse. °· You have new symptoms, such as: °? Headache. °? Dizziness. °? Visual changes. °Get help right away if: °· You develop a severe or sudden headache. °· You have seizures. °· You develop numbness or weakness on one side of your body. °· You have difficulty thinking, speaking, or swallowing. °· You develop severe abdominal pain. °· You develop difficulty breathing, chest pain, a racing heartbeat, or heart palpitations. °These symptoms may represent a serious problem that is an emergency. Do not wait to see if the symptoms will go away. Get medical help right away. Call your local emergency services (911 in the [place] Do not drive yourself to the hospital. °This information is not intended to replace advice given to you by your health care provider. Make sure you discuss any questions you have with your health care provider. °Document Released: 12/28/2013 Document Revised: 09/29/2015 Document Reviewed: 11/08/2013 °[employer] Interactive Patient Education © 2018 [employer] ° °

## 2017-11-02 NOTE — Patient Instructions (Signed)
Preeclampsia and Eclampsia °Preeclampsia is a serious condition that develops only during pregnancy. It is also called toxemia of pregnancy. This condition causes high blood pressure along with other symptoms, such as swelling and headaches. These symptoms may develop as the condition gets worse. Preeclampsia may occur at 20 weeks of pregnancy or later. °Diagnosing and treating preeclampsia early is very important. If not treated early, it can cause serious problems for you and your baby. One problem it can lead to is eclampsia, which is a condition that causes muscle jerking or shaking (convulsions or seizures) in the mother. Delivering your baby is the best treatment for preeclampsia or eclampsia. Preeclampsia and eclampsia symptoms usually go away after your baby is born. °What are the causes? °The cause of preeclampsia is not known. °What increases the risk? °The following risk factors make you more likely to develop preeclampsia: °· Being pregnant for the first time. °· Having had preeclampsia during a past pregnancy. °· Having a family history of preeclampsia. °· Having high blood pressure. °· Being pregnant with twins or triplets. °· Being 35 or older. °· Being African-American. °· Having kidney disease or diabetes. °· Having medical conditions such as lupus or blood diseases. °· Being very overweight (obese). ° °What are the signs or symptoms? °The earliest signs of preeclampsia are: °· High blood pressure. °· Increased protein in your urine. Your health care provider will check for this at every visit before you give birth (prenatal visit). ° °Other symptoms that may develop as the condition gets worse include: °· Severe headaches. °· Sudden weight gain. °· Swelling of the hands, face, legs, and feet. °· Nausea and vomiting. °· Vision problems, such as blurred or double vision. °· Numbness in the face, arms, legs, and feet. °· Urinating less than usual. °· Dizziness. °· Slurred speech. °· Abdominal pain,  especially upper abdominal pain. °· Convulsions or seizures. ° °Symptoms generally go away after giving birth. °How is this diagnosed? °There are no screening tests for preeclampsia. Your health care provider will ask you about symptoms and check for signs of preeclampsia during your prenatal visits. You may also have tests that include: °· Urine tests. °· Blood tests. °· Checking your blood pressure. °· Monitoring your baby’s heart rate. °· Ultrasound. ° °How is this treated? °You and your health care provider will determine the treatment approach that is best for you. Treatment may include: °· Having more frequent prenatal exams to check for signs of preeclampsia, if you have an increased risk for preeclampsia. °· Bed rest. °· Reducing how much salt (sodium) you eat. °· Medicine to lower your blood pressure. °· Staying in the hospital, if your condition is severe. There, treatment will focus on controlling your blood pressure and the amount of fluids in your body (fluid retention). °· You may need to take medicine (magnesium sulfate) to prevent seizures. This medicine may be given as an injection or through an IV tube. °· Delivering your baby early, if your condition gets worse. You may have your labor started with medicine (induced), or you may have a cesarean delivery. ° °Follow these instructions at home: °Eating and drinking ° °· Drink enough fluid to keep your urine clear or pale yellow. °· Eat a healthy diet that is low in sodium. Do not add salt to your food. Check nutrition labels to see how much sodium a food or beverage contains. °· Avoid caffeine. °Lifestyle °· Do not use any products that contain nicotine or tobacco, such as cigarettes   and e-cigarettes. If you need help quitting, ask your health care provider. °· Do not use alcohol or drugs. °· Avoid stress as much as possible. Rest and get plenty of sleep. °General instructions °· Take over-the-counter and prescription medicines only as told by your  health care provider. °· When lying down, lie on your side. This keeps pressure off of your baby. °· When sitting or lying down, raise (elevate) your feet. Try putting some pillows underneath your lower legs. °· Exercise regularly. Ask your health care provider what kinds of exercise are best for you. °· Keep all follow-up and prenatal visits as told by your health care provider. This is important. °How is this prevented? °To prevent preeclampsia or eclampsia from developing during another pregnancy: °· Get proper medical care during pregnancy. Your health care provider may be able to prevent preeclampsia or diagnose and treat it early. °· Your health care provider may have you take a low-dose aspirin or a calcium supplement during your next pregnancy. °· You may have tests of your blood pressure and kidney function after giving birth. °· Maintain a healthy weight. Ask your health care provider for help managing weight gain during pregnancy. °· Work with your health care provider to manage any long-term (chronic) health conditions you have, such as diabetes or kidney problems. ° °Contact a health care provider if: °· You gain more weight than expected. °· You have headaches. °· You have nausea or vomiting. °· You have abdominal pain. °· You feel dizzy or light-headed. °Get help right away if: °· You develop sudden or severe swelling anywhere in your body. This usually happens in the legs. °· You gain 5 lbs (2.3 kg) or more during one week. °· You have severe: °? Abdominal pain. °? Headaches. °? Dizziness. °? Vision problems. °? Confusion. °? Nausea or vomiting. °· You have a seizure. °· You have trouble moving any part of your body. °· You develop numbness in any part of your body. °· You have trouble speaking. °· You have any abnormal bleeding. °· You pass out. °This information is not intended to replace advice given to you by your health care provider. Make sure you discuss any questions you have with your health  care provider. °Document Released: 04/23/2000 Document Revised: 12/23/2015 Document Reviewed: 12/01/2015 °Elsevier Interactive Patient Education © 2018 [employer] ° °

## 2017-11-02 NOTE — Assessment & Plan Note (Signed)
Have advised need for MAU eval due to headache and vision changes. Check labs and give meds. If headache persists, may need inpatient with magnesium. Patient reluctant to go to hospital due to cost. Advised risk of seizure and stroke. Spoke to MAU and they are expecting her.

## 2017-11-02 NOTE — MAU Provider Note (Signed)
History     CSN: [id number]  Arrival date and time: 11/02/17 1807   None     Chief Complaint  Patient presents with  . Headache  . Blurred Vision  . Postpartum Complications   HPI  Megan Hunt is a 32 y.o. [id number] nonpregnant patient sent to MAU from clinic for postpartum preeclampsia workup. Diagnosis of chronic hypertension. Patient is taking Norvasc and Iron. Reports headache and spots in visual field x two days, not resolved by Ibuprofen, Tylenol or rest. Describes headache as location as behind her eyes, rated 6-9/10. Denies abdominal pain.  S/p SVD over intact perineum 10/25/2017. Did not receive Magnesium Sulfate prophylaxis during delivery.  OB History    Gravida  4   Para  4   Term  4   Preterm      AB      Living  4     SAB      TAB      Ectopic      Multiple  0   Live Births  4           Past Medical History:  Diagnosis Date  . Allergic rhinitis   . Allergy   . Anemia   . Anxiety   . Anxiety and depression   . Asthma   . Asthma, mild intermittent 10/27/2008   Qualifier: Diagnosis of  By: Kriste BasqueNadel MD, Lonzo CloudScott M   . Atypical chest pain   . Back pain   . Constipation   . History of migraines   . Hypercholesteremia   . Hypertension    no longer taking meds  . Insomnia 05/15/2016  . Somatic dysfunction     Past Surgical History:  Procedure Laterality Date  . WISDOM TOOTH EXTRACTION      Family History  Problem Relation Age of Onset  . Hypertension Father   . Hypertension Mother   . Arthritis Brother   . Stroke Maternal Grandfather   . Hypertension Maternal Grandfather     Social History   Tobacco Use  . Smoking status: Never Smoker  . Smokeless tobacco: Never Used  Substance Use Topics  . Alcohol use: No  . Drug use: No    Allergies: No Known Allergies  Medications Prior to Admission  Medication Sig Dispense Refill Last Dose  . amLODipine (NORVASC) 5 MG tablet Take 1 tablet (5 mg total) by mouth daily. 1 tablet 3  Taking  . ferrous sulfate 300 (60 Fe) MG/5ML syrup Take 5 mLs (300 mg total) by mouth 2 (two) times daily with a meal. 150 mL 3 Taking  . ibuprofen (ADVIL,MOTRIN) 600 MG tablet Take 1 tablet (600 mg total) by mouth every 6 (six) hours. 30 tablet 0 Taking    Review of Systems  Gastrointestinal: Negative for nausea and vomiting.  Genitourinary: Negative for difficulty urinating, dyspareunia, dysuria, flank pain, vaginal bleeding, vaginal discharge and vaginal pain.  Neurological: Positive for headaches.   Physical Exam   Last menstrual period 01/25/2017, unknown if currently breastfeeding.  Physical Exam  Nursing note and vitals reviewed. Constitutional: She is oriented to person, place, and time. She appears well-developed and well-nourished.  Cardiovascular: Normal rate, regular rhythm, normal heart sounds and intact distal pulses.  Respiratory: Effort normal and breath sounds normal.  GI: Soft. Normal appearance and bowel sounds are normal. There is no tenderness. There is no CVA tenderness.  Musculoskeletal: Normal range of motion.  Neurological: She is alert and oriented to person, place, and time. She  has normal reflexes.  Skin: Skin is warm and dry.  Psychiatric: She has a normal mood and affect. Her behavior is normal. Judgment and thought content normal.    MAU Course  Procedures  MDM Orders Placed This Encounter  Procedures  . CBC    Standing Status:   Standing    Number of Occurrences:   1  . Comprehensive metabolic panel    Standing Status:   Standing    Number of Occurrences:   1  . Protein / creatinine ratio, urine    Standing Status:   Standing    Number of Occurrences:   1    Results for orders placed or performed during the hospital encounter of 11/02/17 (from the past 24 hour(s))  Protein / creatinine ratio, urine     Status: None   Collection Time: 11/02/17  6:23 PM  Result Value Ref Range   Creatinine, Urine 105.00 mg/dL   Total Protein, Urine 14 mg/dL    Protein Creatinine Ratio 0.13 0.00 - 0.15 mg/mg[Cre]  CBC     Status: Abnormal   Collection Time: 11/02/17  6:33 PM  Result Value Ref Range   WBC 10.5 4.0 - 10.5 K/uL   RBC 3.73 (L) 3.87 - 5.11 MIL/uL   Hemoglobin 10.0 (L) 12.0 - 15.0 g/dL   HCT 16.1 (L) 09.6 - 04.5 %   MCV 83.6 78.0 - 100.0 fL   MCH 26.8 26.0 - 34.0 pg   MCHC 32.1 30.0 - 36.0 g/dL   RDW 40.9 81.1 - 91.4 %   Platelets 368 150 - 400 K/uL  Comprehensive metabolic panel     Status: Abnormal   Collection Time: 11/02/17  6:33 PM  Result Value Ref Range   Sodium 136 135 - 145 mmol/L   Potassium 4.1 3.5 - 5.1 mmol/L   Chloride 104 98 - 111 mmol/L   CO2 21 (L) 22 - 32 mmol/L   Glucose, Bld 103 (H) 70 - 99 mg/dL   BUN 11 6 - 20 mg/dL   Creatinine, Ser 7.82 0.44 - 1.00 mg/dL   Calcium 8.6 (L) 8.9 - 10.3 mg/dL   Total Protein 6.9 6.5 - 8.1 g/dL   Albumin 3.4 (L) 3.5 - 5.0 g/dL   AST 23 15 - 41 U/L   ALT 34 0 - 44 U/L   Alkaline Phosphatase 217 (H) 38 - 126 U/L   Total Bilirubin 0.6 0.3 - 1.2 mg/dL   GFR calc non Af Amer >60 >60 mL/min   GFR calc Af Amer >60 >60 mL/min   Anion gap 11 5 - 15    Chronic hypertensive patient on Norvasc and Iron Severe range pressures in MAU responsive to IV Hydralazine Headache improving as patient returns to baseline BP, now rating 3/10 Normal labs  Assessment and Plan  --Postpartum hypertension  Meds ordered this encounter  Medications  . hydrALAZINE (APRESOLINE) injection 5-10 mg  . labetalol (NORMODYNE,TRANDATE) injection 20-40 mg  . lactated ringers infusion  . amLODipine (NORVASC) 10 MG tablet    Sig: Take 1 tablet (10 mg total) by mouth daily.    Dispense:  30 tablet    Refill:  3    Order Specific Question:   Supervising Provider    Answer:   Reva Bores [[id number]]  . hydrochlorothiazide (HYDRODIURIL) 25 MG tablet    Sig: Take 1 tablet (25 mg total) by mouth daily.    Dispense:  30 tablet    Refill:  30  Order Specific Question:   Supervising Provider    Answer:    Samara Snide   --Patient to have blood pressure checked in clinic 11/07/2017 --Reviewed general preeclampsia precautions and signs to return to MAU before Monday (AVS) --Discharge home in stable condition   HPI, labs, and plan of care discussed with Dr. Despina Hidden, who agrees with my treatment plan  Calvert Cantor, CNM 11/02/2017, 7:56 PM

## 2017-11-07 ENCOUNTER — Ambulatory Visit: Payer: 59

## 2017-11-07 VITALS — BP 138/83 | HR 89 | Resp 16 | Ht 64.0 in | Wt 198.0 lb

## 2017-11-07 DIAGNOSIS — O10919 Unspecified pre-existing hypertension complicating pregnancy, unspecified trimester: Secondary | ICD-10-CM

## 2017-11-07 NOTE — Progress Notes (Addendum)
Subjective:  Greer Genene ChurnM Frieling is a 32 y.o. female here for BP check.   Hypertension ROS: taking medications as instructed, no medication side effects noted, no TIA's, no chest pain on exertion, no dyspnea on exertion and no swelling of ankles.    Objective:  There were no vitals taken for this visit.  Appearance alert, well appearing, and in no distress. General exam BP noted to be well controlled today in office.    Assessment:   Blood Pressure improved.   Plan:  Current treatment plan is effective, no change in therapy.Flonnie Hailstone.  Attestation of Attending Supervision of RN: Evaluation and management procedures were performed by the nurse under my supervision and collaboration.  I have reviewed the nursing note and chart, and I agree with the management and plan.  Carolyn L. Harraway-Smith, M.D., Evern CoreFACOG

## 2017-11-15 ENCOUNTER — Other Ambulatory Visit: Payer: Self-pay | Admitting: Student

## 2017-11-24 ENCOUNTER — Ambulatory Visit (INDEPENDENT_AMBULATORY_CARE_PROVIDER_SITE_OTHER): Payer: 59 | Admitting: Family Medicine

## 2017-11-24 ENCOUNTER — Encounter: Payer: Self-pay | Admitting: Family Medicine

## 2017-11-24 VITALS — BP 120/72 | HR 76 | Temp 97.6°F | Resp 18 | Ht 64.0 in | Wt 191.2 lb

## 2017-11-24 DIAGNOSIS — E7800 Pure hypercholesterolemia, unspecified: Secondary | ICD-10-CM

## 2017-11-24 DIAGNOSIS — Z0000 Encounter for general adult medical examination without abnormal findings: Secondary | ICD-10-CM | POA: Diagnosis not present

## 2017-11-24 DIAGNOSIS — I10 Essential (primary) hypertension: Secondary | ICD-10-CM

## 2017-11-24 DIAGNOSIS — G43009 Migraine without aura, not intractable, without status migrainosus: Secondary | ICD-10-CM

## 2017-11-24 DIAGNOSIS — F341 Dysthymic disorder: Secondary | ICD-10-CM | POA: Diagnosis not present

## 2017-11-24 DIAGNOSIS — D649 Anemia, unspecified: Secondary | ICD-10-CM

## 2017-11-24 LAB — COMPREHENSIVE METABOLIC PANEL
ALBUMIN: 4.3 g/dL (ref 3.5–5.2)
ALT: 21 U/L (ref 0–35)
AST: 19 U/L (ref 0–37)
Alkaline Phosphatase: 171 U/L — ABNORMAL HIGH (ref 39–117)
BILIRUBIN TOTAL: 0.4 mg/dL (ref 0.2–1.2)
BUN: 9 mg/dL (ref 6–23)
CALCIUM: 9.1 mg/dL (ref 8.4–10.5)
CHLORIDE: 102 meq/L (ref 96–112)
CO2: 30 mEq/L (ref 19–32)
CREATININE: 0.83 mg/dL (ref 0.40–1.20)
GFR: 102.04 mL/min (ref >60.00)
Glucose, Bld: 81 mg/dL (ref 70–99)
Potassium: 4 mEq/L (ref 3.5–5.1)
Sodium: 141 mEq/L (ref 135–145)
Total Protein: 7.1 g/dL (ref 6.0–8.3)

## 2017-11-24 LAB — VITAMIN D 25 HYDROXY (VIT D DEFICIENCY, FRACTURES): VITD: 12.21 ng/mL — ABNORMAL LOW (ref 30.00–100.00)

## 2017-11-24 LAB — TSH: TSH: 0.52 u[IU]/mL (ref 0.35–4.50)

## 2017-11-24 NOTE — Assessment & Plan Note (Signed)
Well controlled, no changes to meds. Encouraged heart healthy diet such as the DASH diet and exercise as tolerated.  °

## 2017-11-24 NOTE — Progress Notes (Signed)
Subjective:  I acted as a Neurosurgeon for Dr. Abner Greenspan. [place], [place]  Patient ID: Megan Hunt, female    DOB: 05-20-1985, 32 y.o.   MRN: [id number]  Chief Complaint  Patient presents with  . Annual Exam    HPI  Patient is in today for an annual exam and follow up on chronic medical concerns including migraine headaches, anemia, depression and hypercholesterolemia. She has recently had a baby and is nursing. She is having occasional headaches and is unsure what to take. She is managing her fatigue well but is not getting adequate sleep. She denies any significant flare in depression or anxiety. She is trying to maintain a heart healthy diet. Denies CP/palp/SOB/HA/congestion/fevers/GI or GU c/o. Taking meds as prescribed Patient Care Team: Bradd Canary, MD as PCP - General (Family Medicine) Kathreen Cosier, MD (Inactive) as Consulting Physician (Obstetrics and Gynecology)   Past Medical History:  Diagnosis Date  . Allergic rhinitis   . Allergy   . Anemia   . Anxiety   . Anxiety and depression   . Asthma   . Asthma, mild intermittent 10/27/2008   Qualifier: Diagnosis of  By: Kriste Basque MD, Lonzo Cloud   . Atypical chest pain   . Back pain   . Constipation   . History of migraines   . Hypercholesteremia   . Hypertension    no longer taking meds  . Insomnia 05/15/2016  . Somatic dysfunction     Past Surgical History:  Procedure Laterality Date  . WISDOM TOOTH EXTRACTION      Family History  Problem Relation Age of Onset  . Hypertension Father   . Hypertension Mother   . Arthritis Brother   . Stroke Maternal Grandfather   . Hypertension Maternal Grandfather     Social History   Socioeconomic History  . Marital status: Married    Spouse name: Not on file  . Number of children: 2  . Years of education: Not on file  . Highest education level: Not on file  Occupational History  . 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 Financial resource strain: Not on file  . Food insecurity:    Worry: Not on file    Inability: Not on file  . Transportation needs:    Medical: Not on file    Non-medical: Not on file  Tobacco Use  . Smoking status: Never Smoker  . Smokeless tobacco: Never Used  Substance and Sexual Activity  . Alcohol use: No  . Drug use: No  . Sexual activity: Not Currently    Birth control/protection: None    Comment: lives with husband and 3 small children, no dietary restrictions, stays at home  Lifestyle  . Physical activity:    Days per week: Not on file    Minutes per session: Not on file  . Stress: Not on file  Relationships  . Social connections:    Talks on phone: Not on file    Gets together: Not on file    Attends religious service: Not on file    Active member of club or organization: Not on file    Attends meetings of clubs or organizations: Not on file    Relationship status: Not on file  . Intimate partner violence:    Fear of current or ex partner: Not on file    Emotionally abused: Not on file  Physically abused: Not on file    Forced sexual activity: Not on file  Other Topics Concern  . Not on file  Social History Narrative   Son Swazilandjordan born 02/2008 and daughter Penne Lashjamaya born 06/2009   Ivin BootyJoshua born 03/22/2013    Outpatient Medications Prior to Visit  Medication Sig Dispense Refill  . amLODipine (NORVASC) 10 MG tablet Take 1 tablet (10 mg total) by mouth daily. 30 tablet 3  . ferrous sulfate 300 (60 Fe) MG/5ML syrup Take 5 mLs (300 mg total) by mouth 2 (two) times daily with a meal. 150 mL 3  . hydrochlorothiazide (HYDRODIURIL) 25 MG tablet Take 1 tablet (25 mg total) by mouth daily. 30 tablet 30  . ibuprofen (ADVIL,MOTRIN) 600 MG tablet Take 1 tablet (600 mg total) by mouth every 6 (six) hours. 30 tablet 0   No facility-administered medications prior to visit.     No Known Allergies  Review of Systems  Constitutional:  Positive for malaise/fatigue. Negative for chills and fever.  HENT: Negative for congestion and hearing loss.   Eyes: Negative for discharge.  Respiratory: Negative for cough, sputum production and shortness of breath.   Cardiovascular: Negative for chest pain, palpitations and leg swelling.  Gastrointestinal: Negative for abdominal pain, blood in stool, constipation, diarrhea, heartburn, nausea and vomiting.  Genitourinary: Negative for dysuria, frequency, hematuria and urgency.  Musculoskeletal: Negative for back pain, falls and myalgias.  Skin: Negative for rash.  Neurological: Positive for headaches. Negative for dizziness, sensory change, loss of consciousness and weakness.  Endo/Heme/Allergies: Negative for environmental allergies. Does not bruise/bleed easily.  Psychiatric/Behavioral: Negative for depression and suicidal ideas. The patient is not nervous/anxious and does not have insomnia.        Objective:    Physical Exam  Constitutional: She is oriented to person, place, and time. She appears well-developed and well-nourished. No distress.  HENT:  Head: Normocephalic and atraumatic.  Eyes: Conjunctivae are normal.  Neck: Neck supple. No thyromegaly present.  Cardiovascular: Normal rate, regular rhythm and normal heart sounds.  No murmur heard. Pulmonary/Chest: Effort normal and breath sounds normal. No respiratory distress.  Abdominal: Soft. Bowel sounds are normal. She exhibits no distension and no mass. There is no tenderness.  Musculoskeletal: She exhibits no edema.  Lymphadenopathy:    She has no cervical adenopathy.  Neurological: She is alert and oriented to person, place, and time.  Skin: Skin is warm and dry.  Psychiatric: She has a normal mood and affect. Her behavior is normal.    BP 120/72 (BP Location: Left Arm, Patient Position: Sitting, Cuff Size: Normal)   Pulse 76   Temp 97.6 F (36.4 C) (Oral)   Resp 18   Ht 5\' 4"  (1.626 m)   Wt 191 lb 3.2 oz (86.7  kg)   SpO2 97%   BMI 32.82 kg/m  Wt Readings from Last 3 Encounters:  11/24/17 191 lb 3.2 oz (86.7 kg)  11/07/17 198 lb 0.6 oz (89.8 kg)  11/02/17 200 lb 7 oz (90.9 kg)   BP Readings from Last 3 Encounters:  11/24/17 120/72  11/07/17 138/83  11/02/17 (!) 147/93     Immunization History  Administered Date(s) Administered  . DTaP 01/01/2011  . Influenza Whole 06/24/2009  . Influenza,inj,Quad PF,6+ Mos 03/23/2013, 03/11/2015, 03/18/2016, 02/15/2017  . Tdap 03/23/2013    Health Maintenance  Topic Date Due  . INFLUENZA VACCINE  12/08/2017  . PAP SMEAR  06/01/2020  . TETANUS/TDAP  03/24/2023  . HIV Screening  Completed  Lab Results  Component Value Date   WBC 10.5 11/02/2017   HGB 10.0 (L) 11/02/2017   HCT 31.2 (L) 11/02/2017   PLT 368 11/02/2017   GLUCOSE 81 11/24/2017   CHOL 209 (H) 12/07/2016   TRIG 136.0 12/07/2016   HDL 68.90 12/07/2016   LDLDIRECT 177.4 06/15/2013   LDLCALC 113 (H) 12/07/2016   ALT 21 11/24/2017   AST 19 11/24/2017   NA 141 11/24/2017   K 4.0 11/24/2017   CL 102 11/24/2017   CREATININE 0.83 11/24/2017   BUN 9 11/24/2017   CO2 30 11/24/2017   TSH 0.52 11/24/2017    Lab Results  Component Value Date   TSH 0.52 11/24/2017   Lab Results  Component Value Date   WBC 10.5 11/02/2017   HGB 10.0 (L) 11/02/2017   HCT 31.2 (L) 11/02/2017   MCV 83.6 11/02/2017   PLT 368 11/02/2017   Lab Results  Component Value Date   NA 141 11/24/2017   K 4.0 11/24/2017   CO2 30 11/24/2017   GLUCOSE 81 11/24/2017   BUN 9 11/24/2017   CREATININE 0.83 11/24/2017   BILITOT 0.4 11/24/2017   ALKPHOS 171 (H) 11/24/2017   AST 19 11/24/2017   ALT 21 11/24/2017   PROT 7.1 11/24/2017   ALBUMIN 4.3 11/24/2017   CALCIUM 9.1 11/24/2017   ANIONGAP 11 11/02/2017   GFR 102.04 11/24/2017   Lab Results  Component Value Date   CHOL 209 (H) 12/07/2016   Lab Results  Component Value Date   HDL 68.90 12/07/2016   Lab Results  Component Value Date    LDLCALC 113 (H) 12/07/2016   Lab Results  Component Value Date   TRIG 136.0 12/07/2016   Lab Results  Component Value Date   CHOLHDL 3 12/07/2016   No results found for: HGBA1C       Assessment & Plan:   Problem List Items Addressed This Visit    HYPERCHOLESTEROLEMIA    Encouraged heart healthy diet, increase exercise, avoid trans fats, consider a krill oil cap daily      ANXIETY DEPRESSION    She has chosen not to take medications and she feels she is doing well at the present time.       Essential hypertension     Well controlled, no changes to meds. Encouraged heart healthy diet such as the DASH diet and exercise as tolerated.       Relevant Orders   TSH (Completed)   Migraine    She is breastfeeding so she really has to rely on tylenol prn. Encouraged increased hydration, 64 ounces of clear fluids daily. Minimize alcohol and caffeine. Eat small frequent meals with lean proteins and complex carbs. Avoid high and low blood sugars. Get adequate sleep, 7-8 hours a night. Needs exercise daily preferably in the morning.      Preventative health care    Patient encouraged to maintain heart healthy diet, regular exercise, adequate sleep. Consider daily probiotics. Take medications as prescribed. Labs reviewed      Anemia    Increase leafy greens, consider increased lean red meat and using cast iron cookware. Continue to monitor, report any concerns       Other Visit Diagnoses    Hypocalcemia    -  Primary   Relevant Orders   VITAMIN D 25 Hydroxy (Vit-D Deficiency, Fractures) (Completed)   Comprehensive metabolic panel (Completed)      I have discontinued Sherene M. Endsley's ibuprofen. I am also having her start  on Vitamin D (Ergocalciferol). Additionally, I am having her maintain her ferrous sulfate, amLODipine, and hydrochlorothiazide.  Meds ordered this encounter  Medications  . Vitamin D, Ergocalciferol, (DRISDOL) 50000 units CAPS capsule    Sig: Take 1 capsule  (50,000 Units total) by mouth every 7 (seven) days.    Dispense:  4 capsule    Refill:  4    CMA served as scribe during this visit. History, Physical and Plan performed by medical provider. Documentation and orders reviewed and attested to.  Danise Edge, MD

## 2017-11-24 NOTE — Patient Instructions (Signed)
65 to 80 oz clear fluids daily More fruits and vegetables, dried fruit Preventive Care 18-39 Years, Female Preventive care refers to lifestyle choices and visits with your health care provider that can promote health and wellness. What does preventive care include?  A yearly physical exam. This is also called an annual well check.  Dental exams once or twice a year.  Routine eye exams. Ask your health care provider how often you should have your eyes checked.  Personal lifestyle choices, including: ? Daily care of your teeth and gums. ? Regular physical activity. ? Eating a healthy diet. ? Avoiding tobacco and drug use. ? Limiting alcohol use. ? Practicing safe sex. ? Taking vitamin and mineral supplements as recommended by your health care provider. What happens during an annual well check? The services and screenings done by your health care provider during your annual well check will depend on your age, overall health, lifestyle risk factors, and family history of disease. Counseling Your health care provider may ask you questions about your:  Alcohol use.  Tobacco use.  Drug use.  Emotional well-being.  Home and relationship well-being.  Sexual activity.  Eating habits.  Work and work Statistician.  Method of birth control.  Menstrual cycle.  Pregnancy history.  Screening You may have the following tests or measurements:  Height, weight, and BMI.  Diabetes screening. This is done by checking your blood sugar (glucose) after you have not eaten for a while (fasting).  Blood pressure.  Lipid and cholesterol levels. These may be checked every 5 years starting at age 5.  Skin check.  Hepatitis C blood test.  Hepatitis B blood test.  Sexually transmitted disease (STD) testing.  BRCA-related cancer screening. This may be done if you have a family history of breast, ovarian, tubal, or peritoneal cancers.  Pelvic exam and Pap test. This may be done every  3 years starting at age 71. Starting at age 6, this may be done every 5 years if you have a Pap test in combination with an HPV test.  Discuss your test results, treatment options, and if necessary, the need for more tests with your health care provider. Vaccines Your health care provider may recommend certain vaccines, such as:  Influenza vaccine. This is recommended every year.  Tetanus, diphtheria, and acellular pertussis (Tdap, Td) vaccine. You may need a Td booster every 10 years.  Varicella vaccine. You may need this if you have not been vaccinated.  HPV vaccine. If you are 63 or younger, you may need three doses over 6 months.  Measles, mumps, and rubella (MMR) vaccine. You may need at least one dose of MMR. You may also need a second dose.  Pneumococcal 13-valent conjugate (PCV13) vaccine. You may need this if you have certain conditions and were not previously vaccinated.  Pneumococcal polysaccharide (PPSV23) vaccine. You may need one or two doses if you smoke cigarettes or if you have certain conditions.  Meningococcal vaccine. One dose is recommended if you are age 71-21 years and a first-year college student living in a residence hall, or if you have one of several medical conditions. You may also need additional booster doses.  Hepatitis A vaccine. You may need this if you have certain conditions or if you travel or work in places where you may be exposed to hepatitis A.  Hepatitis B vaccine. You may need this if you have certain conditions or if you travel or work in places where you may be exposed to hepatitis  B.  Haemophilus influenzae type b (Hib) vaccine. You may need this if you have certain risk factors.  Talk to your health care provider about which screenings and vaccines you need and how often you need them. This information is not intended to replace advice given to you by your health care provider. Make sure you discuss any questions you have with your health  care provider. Document Released: 06/22/2001 Document Revised: 01/14/2016 Document Reviewed: 02/25/2015 Elsevier Interactive Patient Education  2018 [employer]. ds,  Tylenol and Benadryl for headaches

## 2017-11-25 MED ORDER — VITAMIN D (ERGOCALCIFEROL) 1.25 MG (50000 UNIT) PO CAPS: 50000.0000 [IU] | ORAL_CAPSULE | ORAL | 4 refills | Status: DC

## 2017-11-27 DIAGNOSIS — Z0000 Encounter for general adult medical examination without abnormal findings: Secondary | ICD-10-CM | POA: Insufficient documentation

## 2017-11-27 DIAGNOSIS — D649 Anemia, unspecified: Secondary | ICD-10-CM | POA: Insufficient documentation

## 2017-11-27 NOTE — Assessment & Plan Note (Signed)
Encouraged heart healthy diet, increase exercise, avoid trans fats, consider a krill oil cap daily 

## 2017-11-27 NOTE — Assessment & Plan Note (Signed)
Patient encouraged to maintain heart healthy diet, regular exercise, adequate sleep. Consider daily probiotics. Take medications as prescribed. Labs reviewed 

## 2017-11-27 NOTE — Assessment & Plan Note (Signed)
She has chosen not to take medications and she feels she is doing well at the present time.

## 2017-11-27 NOTE — Assessment & Plan Note (Signed)
Increase leafy greens, consider increased lean red meat and using cast iron cookware. Continue to monitor, report any concerns 

## 2017-11-27 NOTE — Assessment & Plan Note (Signed)
She is breastfeeding so she really has to rely on tylenol prn. Encouraged increased hydration, 64 ounces of clear fluids daily. Minimize alcohol and caffeine. Eat small frequent meals with lean proteins and complex carbs. Avoid high and low blood sugars. Get adequate sleep, 7-8 hours a night. Needs exercise daily preferably in the morning.

## 2017-11-28 ENCOUNTER — Ambulatory Visit: Payer: 59 | Admitting: Family Medicine

## 2017-11-29 ENCOUNTER — Ambulatory Visit (INDEPENDENT_AMBULATORY_CARE_PROVIDER_SITE_OTHER): Payer: 59 | Admitting: Advanced Practice Midwife

## 2017-11-29 ENCOUNTER — Encounter: Payer: Self-pay | Admitting: Advanced Practice Midwife

## 2017-11-29 ENCOUNTER — Other Ambulatory Visit: Payer: Self-pay

## 2017-11-29 VITALS — BP 134/86 | HR 87 | Temp 97.6°F | Ht 64.0 in | Wt 192.0 lb

## 2017-11-29 DIAGNOSIS — Z1389 Encounter for screening for other disorder: Secondary | ICD-10-CM | POA: Diagnosis not present

## 2017-11-29 DIAGNOSIS — D649 Anemia, unspecified: Secondary | ICD-10-CM

## 2017-11-29 DIAGNOSIS — O10919 Unspecified pre-existing hypertension complicating pregnancy, unspecified trimester: Secondary | ICD-10-CM

## 2017-11-29 NOTE — Progress Notes (Signed)
Post Partum Exam  Megan Hunt is a 32 y.o. [phone] [id number] female who presents for a postpartum visit. She is 5 weeks postpartum following a spontaneous vaginal delivery. I have fully reviewed the prenatal and intrapartum course. The delivery was at 39 gestational weeks.  Anesthesia: epidural. Postpartum course has been uneventful- did have some . Baby's course has been uneventful. Baby is feeding by breast. Bleeding thin lochia. Bowel function is normal. Bladder function is normal. Patient is not sexually active. Contraception method is none. Postpartum depression screening:neg  The following portions of the patient's history were reviewed and updated as appropriate: allergies, current medications, past family history, past medical history, past social history, past surgical history and problem list.  Having some perineal pain at times.  No dysuria or frequency. No abnormal bleeding, just light lochia. Just saw primary doctor who is keeping her on the same meds.    Last pap smear done 06/01/2017 and was Normal  Review of Systems Constitutional: negative for anorexia, chills, fatigue and fevers Gastrointestinal: negative for abdominal pain, constipation, diarrhea and nausea Genitourinary:positive for pain in vagina/perineum at times Integument/breast: negative for breast lump and breast tenderness    Objective:  Blood pressure 138/80, pulse 81, temperature 97.6 F (36.4 C), temperature source Oral, height 5\' 4"  (1.626 m), weight 192 lb (87.1 kg), currently breastfeeding.  General:  alert, cooperative and no distress   Breasts:  inspection negative, no nipple discharge or bleeding, no masses or nodularity palpable and + milk  Lungs: clear to auscultation bilaterally  Heart:  regular rate and rhythm, S1, S2 normal, no murmur, click, rub or gallop  Abdomen: soft, non-tender; bowel sounds normal; no masses,  no organomegaly   Vulva:  normal  Vagina: vagina negative for atrophic mucosa, erythema,  hematoma  and mucosal lacerations  Cervix:  no cervical motion tenderness  Corpus: normal  Adnexa:  normal adnexa  Rectal Exam: Not performed.        Assessment:    Normal postpartum exam. Pap smear not done at today's visit.  Perineal pain is likely post birth tenderness.  If it persists beyond 2-3 more weeks, come back in Breast feeding well Continue BP meds, manage by primary doctor Anemia post hemorrhage, recheck CBC today to see if needs to stay on iron   Plan:   1. Contraception: vasectomy 2. Husband scheduled for vas soon.  Discussed needs negative sperm count before unprotected sex 3. Follow up in: 1 year or as needed.  4.   CBC today 5.   Continue care with primary MD

## 2017-11-29 NOTE — Patient Instructions (Signed)
Anemia Anemia is a condition in which you do not have enough red blood cells or hemoglobin. Hemoglobin is a substance in red blood cells that carries oxygen. When you do not have enough red blood cells or hemoglobin (are anemic), your body cannot get enough oxygen and your organs may not work properly. As a result, you may feel very tired or have other problems. What are the causes? Common causes of anemia include:  Excessive bleeding. Anemia can be caused by excessive bleeding inside or outside the body, including bleeding from the intestine or from periods in women.  Poor nutrition.  Long-lasting (chronic) kidney, thyroid, and liver disease.  Bone marrow disorders.  Cancer and treatments for cancer.  HIV (human immunodeficiency virus) and AIDS (acquired immunodeficiency syndrome).  Treatments for HIV and AIDS.  Spleen problems.  Blood disorders.  Infections, medicines, and autoimmune disorders that destroy red blood cells.  What are the signs or symptoms? Symptoms of this condition include:  Minor weakness.  Dizziness.  Headache.  Feeling heartbeats that are irregular or faster than normal (palpitations).  Shortness of breath, especially with exercise.  Paleness.  Cold sensitivity.  Indigestion.  Nausea.  Difficulty sleeping.  Difficulty concentrating.  Symptoms may occur suddenly or develop slowly. If your anemia is mild, you may not have symptoms. How is this diagnosed? This condition is diagnosed based on:  Blood tests.  Your medical history.  A physical exam.  Bone marrow biopsy.  Your health care provider may also check your stool (feces) for blood and may do additional testing to look for the cause of your bleeding. You may also have other tests, including:  Imaging tests, such as a CT scan or MRI.  Endoscopy.  Colonoscopy.  How is this treated? Treatment for this condition depends on the cause. If you continue to lose a lot of blood,  you may need to be treated at a hospital. Treatment may include:  Taking supplements of iron, vitamin B12, or folic acid.  Taking a hormone medicine (erythropoietin) that can help to stimulate red blood cell growth.  Having a blood transfusion. This may be needed if you lose a lot of blood.  Making changes to your diet.  Having surgery to remove your spleen.  Follow these instructions at home:  Take over-the-counter and prescription medicines only as told by your health care provider.  Take supplements only as told by your health care provider.  Follow any diet instructions that you were given.  Keep all follow-up visits as told by your health care provider. This is important. Contact a health care provider if:  You develop new bleeding anywhere in the body. Get help right away if:  You are very weak.  You are short of breath.  You have pain in your abdomen or chest.  You are dizzy or feel faint.  You have trouble concentrating.  You have bloody or black, tarry stools.  You vomit repeatedly or you vomit up blood. Summary  Anemia is a condition in which you do not have enough red blood cells or enough of a substance in your red blood cells that carries oxygen (hemoglobin).  Symptoms may occur suddenly or develop slowly.  If your anemia is mild, you may not have symptoms.  This condition is diagnosed with blood tests as well as a medical history and physical exam. Other tests may be needed.  Treatment for this condition depends on the cause of the anemia. This information is not intended to replace advice   given to you by your health care provider. Make sure you discuss any questions you have with your health care provider. Document Released: 06/03/2004 Document Revised: 05/28/2016 Document Reviewed: 05/28/2016 Elsevier Interactive Patient Education  2018 [place] Maintenance, Female Adopting a healthy lifestyle and getting preventive care can go a long  way to promote health and wellness. Talk with your health care provider about what schedule of regular examinations is right for you. This is a good chance for you to check in with your provider about disease prevention and staying healthy. In between checkups, there are plenty of things you can do on your own. Experts have done a lot of research about which lifestyle changes and preventive measures are most likely to keep you healthy. Ask your health care provider for more information. Weight and diet Eat a healthy diet  Be sure to include plenty of vegetables, fruits, low-fat dairy products, and lean protein.  Do not eat a lot of foods high in solid fats, added sugars, or salt.  Get regular exercise. This is one of the most important things you can do for your health. ? Most adults should exercise for at least 150 minutes each week. The exercise should increase your heart rate and make you sweat (moderate-intensity exercise). ? Most adults should also do strengthening exercises at least twice a week. This is in addition to the moderate-intensity exercise.  Maintain a healthy weight  Body mass index (BMI) is a measurement that can be used to identify possible weight problems. It estimates body fat based on height and weight. Your health care provider can help determine your BMI and help you achieve or maintain a healthy weight.  For females 33 years of age and older: ? A BMI below 18.5 is considered underweight. ? A BMI of 18.5 to 24.9 is normal. ? A BMI of 25 to 29.9 is considered overweight. ? A BMI of 30 and above is considered obese.  Watch levels of cholesterol and blood lipids  You should start having your blood tested for lipids and cholesterol at 33 years of age, then have this test every 5 years.  You may need to have your cholesterol levels checked more often if: ? Your lipid or cholesterol levels are high. ? You are older than 33 years of age. ? You are at high risk for  heart disease.  Cancer screening Lung Cancer  Lung cancer screening is recommended for adults 33-33 years old who are at high risk for lung cancer because of a history of smoking.  A yearly low-dose CT scan of the lungs is recommended for people who: ? Currently smoke. ? Have quit within the past 15 years. ? Have at least a 30-pack-year history of smoking. A pack year is smoking an average of one pack of cigarettes a day for 1 year.  Yearly screening should continue until it has been 15 years since you quit.  Yearly screening should stop if you develop a health problem that would prevent you from having lung cancer treatment.  Breast Cancer  Practice breast self-awareness. This means understanding how your breasts normally appear and feel.  It also means doing regular breast self-exams. Let your health care provider know about any changes, no matter how small.  If you are in your 20s or 30s, you should have a clinical breast exam (CBE) by a health care provider every 1-3 years as part of a regular health exam.  If you are 40 or older, have  a CBE every year. Also consider having a breast X-ray (mammogram) every year.  If you have a family history of breast cancer, talk to your health care provider about genetic screening.  If you are at high risk for breast cancer, talk to your health care provider about having an MRI and a mammogram every year.  Breast cancer gene (BRCA) assessment is recommended for women who have family members with BRCA-related cancers. BRCA-related cancers include: ? Breast. ? Ovarian. ? Tubal. ? Peritoneal cancers.  Results of the assessment will determine the need for genetic counseling and BRCA1 and BRCA2 testing.  Cervical Cancer Your health care provider may recommend that you be screened regularly for cancer of the pelvic organs (ovaries, uterus, and vagina). This screening involves a pelvic examination, including checking for microscopic changes to  the surface of your cervix (Pap test). You may be encouraged to have this screening done every 3 years, beginning at age 66.  For women ages 92-65, health care providers may recommend pelvic exams and Pap testing every 3 years, or they may recommend the Pap and pelvic exam, combined with testing for human papilloma virus (HPV), every 5 years. Some types of HPV increase your risk of cervical cancer. Testing for HPV may also be done on women of any age with unclear Pap test results.  Other health care providers may not recommend any screening for nonpregnant women who are considered low risk for pelvic cancer and who do not have symptoms. Ask your health care provider if a screening pelvic exam is right for you.  If you have had past treatment for cervical cancer or a condition that could lead to cancer, you need Pap tests and screening for cancer for at least 20 years after your treatment. If Pap tests have been discontinued, your risk factors (such as having a new sexual partner) need to be reassessed to determine if screening should resume. Some women have medical problems that increase the chance of getting cervical cancer. In these cases, your health care provider may recommend more frequent screening and Pap tests.  Colorectal Cancer  This type of cancer can be detected and often prevented.  Routine colorectal cancer screening usually begins at 33 years of age and continues through 33 years of age.  Your health care provider may recommend screening at an earlier age if you have risk factors for colon cancer.  Your health care provider may also recommend using home test kits to check for hidden blood in the stool.  A small camera at the end of a tube can be used to examine your colon directly (sigmoidoscopy or colonoscopy). This is done to check for the earliest forms of colorectal cancer.  Routine screening usually begins at age 70.  Direct examination of the colon should be repeated every  5-10 years through 33 years of age. However, you may need to be screened more often if early forms of precancerous polyps or small growths are found.  Skin Cancer  Check your skin from head to toe regularly.  Tell your health care provider about any new moles or changes in moles, especially if there is a change in a mole's shape or color.  Also tell your health care provider if you have a mole that is larger than the size of a pencil eraser.  Always use sunscreen. Apply sunscreen liberally and repeatedly throughout the day.  Protect yourself by wearing long sleeves, pants, a wide-brimmed hat, and sunglasses whenever you are outside.  Heart  disease, diabetes, and high blood pressure  High blood pressure causes heart disease and increases the risk of stroke. High blood pressure is more likely to develop in: ? People who have blood pressure in the high end of the normal range (130-139/85-89 mm Hg). ? People who are overweight or obese. ? People who are African American.  If you are 30-29 years of age, have your blood pressure checked every 3-5 years. If you are 4 years of age or older, have your blood pressure checked every year. You should have your blood pressure measured twice-once when you are at a hospital or clinic, and once when you are not at a hospital or clinic. Record the average of the two measurements. To check your blood pressure when you are not at a hospital or clinic, you can use: ? An automated blood pressure machine at a pharmacy. ? A home blood pressure monitor.  If you are between 1 years and 65 years old, ask your health care provider if you should take aspirin to prevent strokes.  Have regular diabetes screenings. This involves taking a blood sample to check your fasting blood sugar level. ? If you are at a normal weight and have a low risk for diabetes, have this test once every three years after 33 years of age. ? If you are overweight and have a high risk for  diabetes, consider being tested at a younger age or more often. Preventing infection Hepatitis B  If you have a higher risk for hepatitis B, you should be screened for this virus. You are considered at high risk for hepatitis B if: ? You were born in a country where hepatitis B is common. Ask your health care provider which countries are considered high risk. ? Your parents were born in a high-risk country, and you have not been immunized against hepatitis B (hepatitis B vaccine). ? You have HIV or AIDS. ? You use needles to inject street drugs. ? You live with someone who has hepatitis B. ? You have had sex with someone who has hepatitis B. ? You get hemodialysis treatment. ? You take certain medicines for conditions, including cancer, organ transplantation, and autoimmune conditions.  Hepatitis C  Blood testing is recommended for: ? Everyone born from [birth year] through 1966. ? Anyone with known risk factors for hepatitis C.  Sexually transmitted infections (STIs)  You should be screened for sexually transmitted infections (STIs) including gonorrhea and chlamydia if: ? You are sexually active and are younger than 33 years of age. ? You are older than 33 years of age and your health care provider tells you that you are at risk for this type of infection. ? Your sexual activity has changed since you were last screened and you are at an increased risk for chlamydia or gonorrhea. Ask your health care provider if you are at risk.  If you do not have HIV, but are at risk, it may be recommended that you take a prescription medicine daily to prevent HIV infection. This is called pre-exposure prophylaxis (PrEP). You are considered at risk if: ? You are sexually active and do not regularly use condoms or know the HIV status of your partner(s). ? You take drugs by injection. ? You are sexually active with a partner who has HIV.  Talk with your health care provider about whether you are at high risk  of being infected with HIV. If you choose to begin PrEP, you should first be tested for HIV. You  should then be tested every 3 months for as long as you are taking PrEP. Pregnancy  If you are premenopausal and you may become pregnant, ask your health care provider about preconception counseling.  If you may become pregnant, take 400 to 800 micrograms (mcg) of folic acid every day.  If you want to prevent pregnancy, talk to your health care provider about birth control (contraception). Osteoporosis and menopause  Osteoporosis is a disease in which the bones lose minerals and strength with aging. This can result in serious bone fractures. Your risk for osteoporosis can be identified using a bone density scan.  If you are 76 years of age or older, or if you are at risk for osteoporosis and fractures, ask your health care provider if you should be screened.  Ask your health care provider whether you should take a calcium or vitamin D supplement to lower your risk for osteoporosis.  Menopause may have certain physical symptoms and risks.  Hormone replacement therapy may reduce some of these symptoms and risks. Talk to your health care provider about whether hormone replacement therapy is right for you. Follow these instructions at home:  Schedule regular health, dental, and eye exams.  Stay current with your immunizations.  Do not use any tobacco products including cigarettes, chewing tobacco, or electronic cigarettes.  If you are pregnant, do not drink alcohol.  If you are breastfeeding, limit how much and how often you drink alcohol.  Limit alcohol intake to no more than 1 drink per day for nonpregnant women. One drink equals 12 ounces of beer, 5 ounces of wine, or 1 ounces of hard liquor.  Do not use street drugs.  Do not share needles.  Ask your health care provider for help if you need support or information about quitting drugs.  Tell your health care provider if you often  feel depressed.  Tell your health care provider if you have ever been abused or do not feel safe at home. This information is not intended to replace advice given to you by your health care provider. Make sure you discuss any questions you have with your health care provider. Document Released: 11/09/2010 Document Revised: 10/02/2015 Document Reviewed: 01/28/2015 Elsevier Interactive Patient Education  [employer].

## 2017-11-30 LAB — CBC
Hematocrit: 36.9 % (ref 34.0–46.6)
Hemoglobin: 11.7 g/dL (ref 11.1–15.9)
MCH: 26.4 pg — ABNORMAL LOW (ref 26.6–33.0)
MCHC: 31.7 g/dL (ref 31.5–35.7)
MCV: 83 fL (ref 79–97)
PLATELETS: 261 10*3/uL (ref 150–450)
RBC: 4.44 x10E6/uL (ref 3.77–5.28)
RDW: 13.5 % (ref 12.3–15.4)
WBC: 7.2 10*3/uL (ref 3.4–10.8)

## 2017-12-16 ENCOUNTER — Encounter: Payer: Self-pay | Admitting: Family Medicine

## 2018-01-23 ENCOUNTER — Encounter: Payer: Self-pay | Admitting: Family Medicine

## 2018-03-22 ENCOUNTER — Telehealth: Payer: Self-pay | Admitting: Family Medicine

## 2018-03-22 DIAGNOSIS — E7800 Pure hypercholesterolemia, unspecified: Secondary | ICD-10-CM

## 2018-03-22 DIAGNOSIS — Z0000 Encounter for general adult medical examination without abnormal findings: Secondary | ICD-10-CM

## 2018-03-22 NOTE — Telephone Encounter (Signed)
Copied from CRM [phone]. Topic: Quick Communication - See Telephone Encounter >> Mar 22, 2018  4:10 PM Ninfa MeekerPoole, Bridgett H wrote: CRM for notification. See Telephone encounter for: 03/22/18.  Pt came in and dropped off insurance form to be filled out by Dr. Abner GreenspanBlyth. Pt would like form to be faxed to [phone]. Informed patient paperwork takes 5-7 business days. Form located in Dr. Mariel AloeBlyth's bin in front office.

## 2018-03-24 ENCOUNTER — Other Ambulatory Visit (INDEPENDENT_AMBULATORY_CARE_PROVIDER_SITE_OTHER): Payer: 59

## 2018-03-24 DIAGNOSIS — Z0000 Encounter for general adult medical examination without abnormal findings: Secondary | ICD-10-CM | POA: Diagnosis not present

## 2018-03-24 DIAGNOSIS — E7800 Pure hypercholesterolemia, unspecified: Secondary | ICD-10-CM | POA: Diagnosis not present

## 2018-03-24 NOTE — Telephone Encounter (Signed)
Received Physician Results Form from [facility], missing Lipid panel [must attach report] and Waist circomference; forwarded to provider/SLS 11/15  Per provider, Ok to order Lipid panel and schedule pt, pt is scheduled for 3:15pm this afternoon, is aware not to eat anything today and drink as much water as she wants; also, attained verbal waist circumference from patient/SLS 11/15

## 2018-03-25 LAB — LIPID PANEL
CHOLESTEROL: 204 mg/dL — AB (ref <200)
HDL: 62 mg/dL (ref >50)
LDL Cholesterol (Calc): 121 mg/dL (calc) — ABNORMAL HIGH
NON-HDL CHOLESTEROL (CALC): 142 mg/dL — AB (ref <130)
Total CHOL/HDL Ratio: 3.3 (calc) (ref <5.0)
Triglycerides: 103 mg/dL (ref <150)

## 2018-06-26 ENCOUNTER — Encounter: Payer: Self-pay | Admitting: Family Medicine

## 2018-06-27 ENCOUNTER — Ambulatory Visit: Payer: Self-pay

## 2018-06-27 NOTE — Telephone Encounter (Signed)
Ledon Snare rn  ----- Message -----  From: Mamie Nick  Sent: 06/26/2018  8:43 PM EST  To: Marja Kays Pool  Subject: Appointment scheduled from MyChart          Appointment For: Mamie Nick ([id number])  Visit Type: MYCHART OFFICE VISIT ([id number])    07/13/2018   3:45 PM 20 mins. Bradd Canary, MD    [id number]    Patient Comments:  Chest pains and headaches

## 2018-06-27 NOTE — Telephone Encounter (Signed)
See [facility] telephone triage note.

## 2018-06-27 NOTE — Telephone Encounter (Signed)
Likely is musculoskeletal given the description and her risk factors but she should be seen sooner if possible.

## 2018-06-27 NOTE — Telephone Encounter (Signed)
Pt. Reports last night she bent over to pick up her baby and had a sharp pain in the middle of her chest. Notices this pain with lifting and movement. Lasted a few seconds. No radiation of pain, nausea, sweating. Reports she had hypertension during her pregnancy and feels like maybe her BP is up. Has Headaches and dizziness in the mornings.Made her own appointment for beginning of March.No availability this week with Dr. Abner Greenspan. Pt. Wants to only see her. Please advise. Reason for Disposition . [1] Chest pain lasting <= 5 minutes AND [2] NO chest pain or cardiac symptoms now(Exceptions: pains lasting a few seconds)  Answer Assessment - Initial Assessment Questions 1. LOCATION: "Where does it hurt?"       Hurts in the middle. 2. RADIATION: "Does the pain go anywhere else?" (e.g., into neck, jaw, arms, back)     No 3. ONSET: "When did the chest pain begin?" (Minutes, hours or days)      Started last night  4. PATTERN "Does the pain come and go, or has it been constant since it started?"  "Does it get worse with exertion?"      Comes and goes 5. DURATION: "How long does it last" (e.g., seconds, minutes, hours)     Lasts a few seconds - hurts when she lifts 6. SEVERITY: "How bad is the pain?"  (e.g., Scale 1-10; mild, moderate, or severe)    - MILD (1-3): doesn't interfere with normal activities     - MODERATE (4-7): interferes with normal activities or awakens from sleep    - SEVERE (8-10): excruciating pain, unable to do any normal activities       Mild this morning 7. CARDIAC RISK FACTORS: "Do you have any history of heart problems or risk factors for heart disease?" (e.g., prior heart attack, angina; high blood pressure, diabetes, being overweight, high cholesterol, smoking, or strong family history of heart disease)      HTN in the past 8. PULMONARY RISK FACTORS: "Do you have any history of lung disease?"  (e.g., blood clots in lung, asthma, emphysema, birth control pills)     Asthma as a  child 9. CAUSE: "What do you think is causing the chest pain?"      Unsure 10. OTHER SYMPTOMS: "Do you have any other symptoms?" (e.g., dizziness, nausea, vomiting, sweating, fever, difficulty breathing, cough)       Dizziness with headaches - weekly 11. PREGNANCY: "Is there any chance you are pregnant?" "When was your last menstrual period?"       No  Protocols used: CHEST PAIN-A-AH

## 2018-06-29 NOTE — Telephone Encounter (Signed)
See my chart message  Called patient unable to leave voicemail

## 2018-07-13 ENCOUNTER — Encounter: Payer: Self-pay | Admitting: Family Medicine

## 2018-07-13 ENCOUNTER — Ambulatory Visit (INDEPENDENT_AMBULATORY_CARE_PROVIDER_SITE_OTHER): Payer: 59 | Admitting: Family Medicine

## 2018-07-13 VITALS — BP 142/96 | HR 74 | Temp 98.6°F | Resp 18 | Wt 205.0 lb

## 2018-07-13 DIAGNOSIS — J4520 Mild intermittent asthma, uncomplicated: Secondary | ICD-10-CM | POA: Diagnosis not present

## 2018-07-13 DIAGNOSIS — R0789 Other chest pain: Secondary | ICD-10-CM | POA: Diagnosis not present

## 2018-07-13 DIAGNOSIS — J329 Chronic sinusitis, unspecified: Secondary | ICD-10-CM | POA: Diagnosis not present

## 2018-07-13 DIAGNOSIS — I10 Essential (primary) hypertension: Secondary | ICD-10-CM

## 2018-07-13 MED ORDER — LORATADINE 10 MG PO TABS: 10.0000 mg | ORAL_TABLET | Freq: Every day | ORAL | 5 refills | Status: DC | PRN

## 2018-07-13 MED ORDER — MUPIROCIN 2 % EX OINT: 1.0000 "application " | TOPICAL_OINTMENT | Freq: Two times a day (BID) | CUTANEOUS | 0 refills | Status: DC

## 2018-07-13 MED ORDER — CEFDINIR 300 MG PO CAPS: 300.0000 mg | ORAL_CAPSULE | Freq: Two times a day (BID) | ORAL | 0 refills | Status: AC

## 2018-07-13 MED ORDER — FLUTICASONE PROPIONATE 50 MCG/ACT NA SUSP: 2.0000 | Freq: Every day | NASAL | 6 refills | Status: DC

## 2018-07-13 NOTE — Assessment & Plan Note (Signed)
Likely costochondritis. She has sharp sternal chest pain when she lifts her daughter out of the bath tub. It lasts several minutes to 20 and then resolves without further symptoms CXR is ordered today. Use Tylenol 500 mg po bid, Advil prn and let us know if worsens.

## 2018-07-13 NOTE — Patient Instructions (Addendum)
Probiotic such as Align, [employer] or Culturelle  Encouraged increased hydration and fiber in diet. Daily probiotics. If bowels not moving can use MOM 2 tbls po in 4 oz of warm prune juice by mouth every 2-3 days. If no results then repeat in 4 hours with  Dulcolax suppository pr, may repeat again in 4 more hours as needed. Seek care if symptoms worsen. Consider daily Miralax and/or Dulcolax if symptoms persist.   Miralax and benefiber once to twice   Plain Mucinex twice daily  Hydrate 60-80 ounces Sinusitis, Adult Sinusitis is inflammation of your sinuses. Sinuses are hollow spaces in the bones around your face. Your sinuses are located:  Around your eyes.  In the middle of your forehead.  Behind your nose.  In your cheekbones. Mucus normally drains out of your sinuses. When your nasal tissues become inflamed or swollen, mucus can become trapped or blocked. This allows bacteria, viruses, and fungi to grow, which leads to infection. Most infections of the sinuses are caused by a virus. Sinusitis can develop quickly. It can last for up to 4 weeks (acute) or for more than 12 weeks (chronic). Sinusitis often develops after a cold. What are the causes? This condition is caused by anything that creates swelling in the sinuses or stops mucus from draining. This includes:  Allergies.  Asthma.  Infection from bacteria or viruses.  Deformities or blockages in your nose or sinuses.  Abnormal growths in the nose (nasal polyps).  Pollutants, such as chemicals or irritants in the air.  Infection from fungi (rare). What increases the risk? You are more likely to develop this condition if you:  Have a weak body defense system (immune system).  Do a lot of swimming or diving.  Overuse nasal sprays.  Smoke. What are the signs or symptoms? The main symptoms of this condition are pain and a feeling of pressure around the affected sinuses. Other symptoms include:  Stuffy  nose or congestion.  Thick drainage from your nose.  Swelling and warmth over the affected sinuses.  Headache.  Upper toothache.  A cough that may get worse at night.  Extra mucus that collects in the throat or the back of the nose (postnasal drip).  Decreased sense of smell and taste.  Fatigue.  A fever.  Sore throat.  Bad breath. How is this diagnosed? This condition is diagnosed based on:  Your symptoms.  Your medical history.  A physical exam.  Tests to find out if your condition is acute or chronic. This may include: ? Checking your nose for nasal polyps. ? Viewing your sinuses using a device that has a light (endoscope). ? Testing for allergies or bacteria. ? Imaging tests, such as an MRI or CT scan. In rare cases, a bone biopsy may be done to rule out more serious types of fungal sinus disease. How is this treated? Treatment for sinusitis depends on the cause and whether your condition is chronic or acute.  If caused by a virus, your symptoms should go away on their own within 10 days. You may be given medicines to relieve symptoms. They include: ? Medicines that shrink swollen nasal passages (topical intranasal decongestants). ? Medicines that treat allergies (antihistamines). ? A spray that eases inflammation of the nostrils (topical intranasal corticosteroids). ? Rinses that help get rid of thick mucus in your nose (nasal saline washes).  If caused by bacteria, your health care provider may recommend waiting to see if your symptoms improve. Most bacterial infections  will get better without antibiotic medicine. You may be given antibiotics if you have: ? A severe infection. ? A weak immune system.  If caused by narrow nasal passages or nasal polyps, you may need to have surgery. Follow these instructions at home: Medicines  Take, use, or apply over-the-counter and prescription medicines only as told by your health care provider. These may include nasal  sprays.  If you were prescribed an antibiotic medicine, take it as told by your health care provider. Do not stop taking the antibiotic even if you start to feel better. Hydrate and humidify   Drink enough fluid to keep your urine pale yellow. Staying hydrated will help to thin your mucus.  Use a cool mist humidifier to keep the humidity level in your home above 50%.  Inhale steam for 10-15 minutes, 3-4 times a day, or as told by your health care provider. You can do this in the bathroom while a hot shower is running.  Limit your exposure to cool or dry air. Rest  Rest as much as possible.  Sleep with your head raised (elevated).  Make sure you get enough sleep each night. General instructions   Apply a warm, moist washcloth to your face 3-4 times a day or as told by your health care provider. This will help with discomfort.  Wash your hands often with soap and water to reduce your exposure to germs. If soap and water are not available, use hand sanitizer.  Do not smoke. Avoid being around people who are smoking (secondhand smoke).  Keep all follow-up visits as told by your health care provider. This is important. Contact a health care provider if:  You have a fever.  Your symptoms get worse.  Your symptoms do not improve within 10 days. Get help right away if:  You have a severe headache.  You have persistent vomiting.  You have severe pain or swelling around your face or eyes.  You have vision problems.  You develop confusion.  Your neck is stiff.  You have trouble breathing. Summary  Sinusitis is soreness and inflammation of your sinuses. Sinuses are hollow spaces in the bones around your face.  This condition is caused by nasal tissues that become inflamed or swollen. The swelling traps or blocks the flow of mucus. This allows bacteria, viruses, and fungi to grow, which leads to infection.  If you were prescribed an antibiotic medicine, take it as told by  your health care provider. Do not stop taking the antibiotic even if you start to feel better.  Keep all follow-up visits as told by your health care provider. This is important. This information is not intended to replace advice given to you by your health care provider. Make sure you discuss any questions you have with your health care provider. Document Released: 04/26/2005 Document Revised: 09/26/2017 Document Reviewed: 09/26/2017 Elsevier Interactive Patient Education  2019 [employer].

## 2018-07-13 NOTE — Assessment & Plan Note (Signed)
No significant flare in symptoms with sinusitis

## 2018-07-13 NOTE — Assessment & Plan Note (Signed)
Well controlled, no changes to meds. Encouraged heart healthy diet such as the DASH diet and exercise as tolerated.  °

## 2018-07-13 NOTE — Assessment & Plan Note (Signed)
Started on antibiotics and mupirocin ointment to the nares qhs has had some epistaxis if it persists

## 2018-07-13 NOTE — Progress Notes (Signed)
Subjective:    Patient ID: Megan Hunt, female    DOB: May 06, 1986, 33 y.o.   MRN: [id number]  No chief complaint on file.   HPI Patient is in today for evaluation of sinus symptoms. She has facial pain and pressure, nasal congestion, mild epistaxis, malaise, PND and fatigue for about a month. She also notes some episodes of sharp substernal chest pain when she lifts her daughter out of the bathtub it resolves in a few minutes and has no associated symptoms. No recent fall or trauma. Denies palp/SOB/HA/congestion/fevers/GI or GU c/o. Taking meds as prescribed  Past Medical History:  Diagnosis Date  . Allergic rhinitis   . Allergy   . Anemia   . Anxiety   . Anxiety and depression   . Asthma   . Asthma, mild intermittent 10/27/2008   Qualifier: Diagnosis of  By: Kriste Basque MD, Lonzo Cloud   . Atypical chest pain   . Back pain   . Constipation   . History of migraines   . Hypercholesteremia   . Hypertension    no longer taking meds  . Insomnia 05/15/2016  . Somatic dysfunction     Past Surgical History:  Procedure Laterality Date  . WISDOM TOOTH EXTRACTION      Family History  Problem Relation Age of Onset  . Hypertension Father   . Hypertension Mother   . Arthritis Brother   . Stroke Maternal Grandfather   . Hypertension Maternal Grandfather     Social History   Socioeconomic History  . Marital status: Married    Spouse name: Not on file  . Number of children: 2  . Years of education: Not on file  . Highest education level: Not on file  Occupational History  . 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 Financial resource strain: Not on file  . Food insecurity:    Worry: Not on file    Inability: Not on file  . Transportation needs:    Medical: Not on file    Non-medical: Not on file  Tobacco Use  . Smoking status: Never Smoker  . Smokeless tobacco: Never Used  Substance and  Sexual Activity  . Alcohol use: No  . Drug use: No  . Sexual activity: Not Currently    Birth control/protection: None    Comment: lives with husband and 3 small children, no dietary restrictions, stays at home  Lifestyle  . Physical activity:    Days per week: Not on file    Minutes per session: Not on file  . Stress: Not on file  Relationships  . Social connections:    Talks on phone: Not on file    Gets together: Not on file    Attends religious service: Not on file    Active member of club or organization: Not on file    Attends meetings of clubs or organizations: Not on file    Relationship status: Not on file  . Intimate partner violence:    Fear of current or ex partner: Not on file    Emotionally abused: Not on file    Physically abused: Not on file    Forced sexual activity: Not on file  Other Topics Concern  . Not on file  Social History Narrative   Son [name] born 02/2008 and daughter [name] born 06/2009   Ivin Booty born 03/22/2013    Outpatient Medications  Prior to Visit  Medication Sig Dispense Refill  . amLODipine (NORVASC) 5 MG tablet     . ferrous sulfate 300 (60 Fe) MG/5ML syrup Take 5 mLs (300 mg total) by mouth 2 (two) times daily with a meal. (Patient not taking: Reported on 07/13/2018) 150 mL 3  . hydrochlorothiazide (HYDRODIURIL) 25 MG tablet Take 1 tablet (25 mg total) by mouth daily. (Patient not taking: Reported on 07/13/2018) 30 tablet 30  . Vitamin D, Ergocalciferol, (DRISDOL) 50000 units CAPS capsule Take 1 capsule (50,000 Units total) by mouth every 7 (seven) days. (Patient not taking: Reported on 07/13/2018) 4 capsule 4   No facility-administered medications prior to visit.     No Known Allergies  Review of Systems  Constitutional: Negative for fever and malaise/fatigue.  HENT: Positive for congestion, nosebleeds and sinus pain.   Eyes: Negative for blurred vision.  Respiratory: Negative for shortness of breath.   Cardiovascular: Positive for chest  pain. Negative for palpitations and leg swelling.  Gastrointestinal: Negative for abdominal pain, blood in stool and nausea.  Genitourinary: Negative for dysuria and frequency.  Musculoskeletal: Negative for falls.  Skin: Negative for rash.  Neurological: Negative for dizziness, loss of consciousness and headaches.  Endo/Heme/Allergies: Negative for environmental allergies.  Psychiatric/Behavioral: Negative for depression. The patient is not nervous/anxious.        Objective:    Physical Exam Vitals signs and nursing note reviewed.  Constitutional:      General: She is not in acute distress.    Appearance: She is well-developed.  HENT:     Head: Normocephalic and atraumatic.     Nose: Nose normal.  Eyes:     General:        Right eye: No discharge.        Left eye: No discharge.  Neck:     Musculoskeletal: Normal range of motion and neck supple.  Cardiovascular:     Rate and Rhythm: Normal rate and regular rhythm.     Heart sounds: No murmur.  Pulmonary:     Effort: Pulmonary effort is normal.     Breath sounds: Normal breath sounds.  Abdominal:     General: Bowel sounds are normal.     Palpations: Abdomen is soft.     Tenderness: There is no abdominal tenderness.  Skin:    General: Skin is warm and dry.  Neurological:     Mental Status: She is alert and oriented to person, place, and time.     BP (!) 142/96 (BP Location: Left Arm, Patient Position: Sitting, Cuff Size: Normal)   Pulse 74   Temp 98.6 F (37 C) (Oral)   Resp 18   Wt 205 lb (93 kg)   SpO2 99%   BMI 35.19 kg/m  Wt Readings from Last 3 Encounters:  07/13/18 205 lb (93 kg)  11/29/17 192 lb (87.1 kg)  11/24/17 191 lb 3.2 oz (86.7 kg)     Lab Results  Component Value Date   WBC 7.2 11/29/2017   HGB 11.7 11/29/2017   HCT 36.9 11/29/2017   PLT 261 11/29/2017   GLUCOSE 81 11/24/2017   CHOL 204 (H) 03/24/2018   TRIG 103 03/24/2018   HDL 62 03/24/2018   LDLDIRECT 177.4 06/15/2013   LDLCALC  121 (H) 03/24/2018   ALT 21 11/24/2017   AST 19 11/24/2017   NA 141 11/24/2017   K 4.0 11/24/2017   CL 102 11/24/2017   CREATININE 0.83 11/24/2017   BUN 9 11/24/2017  CO2 30 11/24/2017   TSH 0.52 11/24/2017    Lab Results  Component Value Date   TSH 0.52 11/24/2017   Lab Results  Component Value Date   WBC 7.2 11/29/2017   HGB 11.7 11/29/2017   HCT 36.9 11/29/2017   MCV 83 11/29/2017   PLT 261 11/29/2017   Lab Results  Component Value Date   NA 141 11/24/2017   K 4.0 11/24/2017   CO2 30 11/24/2017   GLUCOSE 81 11/24/2017   BUN 9 11/24/2017   CREATININE 0.83 11/24/2017   BILITOT 0.4 11/24/2017   ALKPHOS 171 (H) 11/24/2017   AST 19 11/24/2017   ALT 21 11/24/2017   PROT 7.1 11/24/2017   ALBUMIN 4.3 11/24/2017   CALCIUM 9.1 11/24/2017   ANIONGAP 11 11/02/2017   GFR 102.04 11/24/2017   Lab Results  Component Value Date   CHOL 204 (H) 03/24/2018   Lab Results  Component Value Date   HDL 62 03/24/2018   Lab Results  Component Value Date   LDLCALC 121 (H) 03/24/2018   Lab Results  Component Value Date   TRIG 103 03/24/2018   Lab Results  Component Value Date   CHOLHDL 3.3 03/24/2018   No results found for: HGBA1C     Assessment & Plan:   Problem List Items Addressed This Visit    Essential hypertension    Well controlled, no changes to meds. Encouraged heart healthy diet such as the DASH diet and exercise as tolerated.       Asthma, mild intermittent    No significant flare in symptoms with sinusitis      CHEST PAIN, ATYPICAL    Likely costochondritis. She has sharp sternal chest pain when she lifts her daughter out of the bath tub. It lasts several minutes to 20 and then resolves without further symptoms CXR is ordered today. Use Tylenol 500 mg po bid, Advil prn and let us know if worsens.       Sinusitis    Started on antibiotics and mupirocin ointment to the nares qhs has had some epistaxis if it persists         Relevant Medications     cefdinir (OMNICEF) 300 MG capsule   fluticasone (FLONASE) 50 MCG/ACT nasal spray   loratadine (CLARITIN) 10 MG tablet    Other Visit Diagnoses    Atypical chest pain    -  Primary   Relevant Orders   DG Chest 2 View      I have discontinued Afrah M. Slane's ferrous sulfate, hydrochlorothiazide, Vitamin D (Ergocalciferol), and amLODipine. I am also having her start on cefdinir, mupirocin ointment, fluticasone, and loratadine.  Meds ordered this encounter  Medications  . cefdinir (OMNICEF) 300 MG capsule    Sig: Take 1 capsule (300 mg total) by mouth 2 (two) times daily for 10 days.    Dispense:  20 capsule    Refill:  0  . mupirocin ointment (BACTROBAN) 2 %    Sig: Place 1 application into the nose 2 (two) times daily.    Dispense:  22 g    Refill:  0  . fluticasone (FLONASE) 50 MCG/ACT nasal spray    Sig: Place 2 sprays into both nostrils daily.    Dispense:  16 g    Refill:  6  . loratadine (CLARITIN) 10 MG tablet    Sig: Take 1 tablet (10 mg total) by mouth daily as needed for allergies.    Dispense:  30 tablet  Refill:  5     Danise Edge, MD

## 2018-10-05 ENCOUNTER — Encounter: Payer: Self-pay | Admitting: Family Medicine

## 2018-10-06 ENCOUNTER — Other Ambulatory Visit: Payer: Self-pay | Admitting: Family Medicine

## 2018-10-06 MED ORDER — AMOXICILLIN-POT CLAVULANATE 875-125 MG PO TABS: 1.0000 | ORAL_TABLET | Freq: Two times a day (BID) | ORAL | 0 refills | Status: DC

## 2018-12-05 ENCOUNTER — Ambulatory Visit (INDEPENDENT_AMBULATORY_CARE_PROVIDER_SITE_OTHER): Payer: 59 | Admitting: Family Medicine

## 2018-12-05 ENCOUNTER — Telehealth: Payer: Self-pay | Admitting: Family Medicine

## 2018-12-05 ENCOUNTER — Encounter: Payer: Self-pay | Admitting: Family Medicine

## 2018-12-05 ENCOUNTER — Other Ambulatory Visit: Payer: Self-pay

## 2018-12-05 VITALS — BP 134/82 | HR 82 | Temp 98.2°F | Resp 18 | Wt 188.0 lb

## 2018-12-05 DIAGNOSIS — Z0001 Encounter for general adult medical examination with abnormal findings: Secondary | ICD-10-CM

## 2018-12-05 DIAGNOSIS — D649 Anemia, unspecified: Secondary | ICD-10-CM

## 2018-12-05 DIAGNOSIS — Z0000 Encounter for general adult medical examination without abnormal findings: Secondary | ICD-10-CM

## 2018-12-05 DIAGNOSIS — R748 Abnormal levels of other serum enzymes: Secondary | ICD-10-CM | POA: Diagnosis not present

## 2018-12-05 DIAGNOSIS — R0789 Other chest pain: Secondary | ICD-10-CM

## 2018-12-05 DIAGNOSIS — I10 Essential (primary) hypertension: Secondary | ICD-10-CM

## 2018-12-05 DIAGNOSIS — E7800 Pure hypercholesterolemia, unspecified: Secondary | ICD-10-CM | POA: Diagnosis not present

## 2018-12-05 DIAGNOSIS — F341 Dysthymic disorder: Secondary | ICD-10-CM

## 2018-12-05 NOTE — Assessment & Plan Note (Signed)
Patient encouraged to maintain heart healthy diet, regular exercise, adequate sleep. Consider daily probiotics. Take medications as prescribed. Labs reviewed 

## 2018-12-05 NOTE — Patient Instructions (Addendum)
[employer] Blood Pressure cuff upper arm Pulse oximeter  Weekly vitals, BP, pulse, temp, weight and oxygen Systolic BP 425-956 is normal Diastolic BP and pulse 38-75 is normal 98.6 Oxygen in 90s Deep breathing   Vitamin C 1000 mg twice a day Zinc 50 mg daily  Call in August 2020 about what options we have for the flu shot in September 2020. Preventive Care 47-34 Years Old, Female Preventive care refers to visits with your health care provider and lifestyle choices that can promote health and wellness. This includes:  A yearly physical exam. This may also be called an annual well check.  Regular dental visits and eye exams.  Immunizations.  Screening for certain conditions.  Healthy lifestyle choices, such as eating a healthy diet, getting regular exercise, not using drugs or products that contain nicotine and tobacco, and limiting alcohol use. What can I expect for my preventive care visit? Physical exam Your health care provider will check your:  Height and weight. This may be used to calculate body mass index (BMI), which tells if you are at a healthy weight.  Heart rate and blood pressure.  Skin for abnormal spots. Counseling Your health care provider may ask you questions about your:  Alcohol, tobacco, and drug use.  Emotional well-being.  Home and relationship well-being.  Sexual activity.  Eating habits.  Work and work Statistician.  Method of birth control.  Menstrual cycle.  Pregnancy history. What immunizations do I need?  Influenza (flu) vaccine  This is recommended every year. Tetanus, diphtheria, and pertussis (Tdap) vaccine  You may need a Td booster every 10 years. Varicella (chickenpox) vaccine  You may need this if you have not been vaccinated. Human papillomavirus (HPV) vaccine  If recommended by your health care provider, you may need three doses over 6 months. Measles, mumps, and rubella (MMR) vaccine  You may need at least one dose  of MMR. You may also need a second dose. Meningococcal conjugate (MenACWY) vaccine  One dose is recommended if you are age 31-21 years and a first-year college student living in a residence hall, or if you have one of several medical conditions. You may also need additional booster doses. Pneumococcal conjugate (PCV13) vaccine  You may need this if you have certain conditions and were not previously vaccinated. Pneumococcal polysaccharide (PPSV23) vaccine  You may need one or two doses if you smoke cigarettes or if you have certain conditions. Hepatitis A vaccine  You may need this if you have certain conditions or if you travel or work in places where you may be exposed to hepatitis A. Hepatitis B vaccine  You may need this if you have certain conditions or if you travel or work in places where you may be exposed to hepatitis B. Haemophilus influenzae type b (Hib) vaccine  You may need this if you have certain conditions. You may receive vaccines as individual doses or as more than one vaccine together in one shot (combination vaccines). Talk with your health care provider about the risks and benefits of combination vaccines. What tests do I need?  Blood tests  Lipid and cholesterol levels. These may be checked every 5 years starting at age 54.  Hepatitis C test.  Hepatitis B test. Screening  Diabetes screening. This is done by checking your blood sugar (glucose) after you have not eaten for a while (fasting).  Sexually transmitted disease (STD) testing.  BRCA-related cancer screening. This may be done if you have a family history of breast,  ovarian, tubal, or peritoneal cancers.  Pelvic exam and Pap test. This may be done every 3 years starting at age 46. Starting at age 105, this may be done every 5 years if you have a Pap test in combination with an HPV test. Talk with your health care provider about your test results, treatment options, and if necessary, the need for more  tests. Follow these instructions at home: Eating and drinking   Eat a diet that includes fresh fruits and vegetables, whole grains, lean protein, and low-fat dairy.  Take vitamin and mineral supplements as recommended by your health care provider.  Do not drink alcohol if: ? Your health care provider tells you not to drink. ? You are pregnant, may be pregnant, or are planning to become pregnant.  If you drink alcohol: ? Limit how much you have to 0-1 drink a day. ? Be aware of how much alcohol is in your drink. In the [place] one drink equals one 12 oz bottle of beer (355 mL), one 5 oz glass of wine (148 mL), or one 1 oz glass of hard liquor (44 mL). Lifestyle  Take daily care of your teeth and gums.  Stay active. Exercise for at least 30 minutes on 5 or more days each week.  Do not use any products that contain nicotine or tobacco, such as cigarettes, e-cigarettes, and chewing tobacco. If you need help quitting, ask your health care provider.  If you are sexually active, practice safe sex. Use a condom or other form of birth control (contraception) in order to prevent pregnancy and STIs (sexually transmitted infections). If you plan to become pregnant, see your health care provider for a preconception visit. What's next?  Visit your health care provider once a year for a well check visit.  Ask your health care provider how often you should have your eyes and teeth checked.  Stay up to date on all vaccines. This information is not intended to replace advice given to you by your health care provider. Make sure you discuss any questions you have with your health care provider. Document Released: 06/22/2001 Document Revised: 01/05/2018 Document Reviewed: 01/05/2018 Elsevier Patient Education  2020 [employer].

## 2018-12-05 NOTE — Telephone Encounter (Signed)
Unable to leave voicemail to schedule 6 month f/u appt from 12/05/18 visit

## 2018-12-06 ENCOUNTER — Encounter: Payer: Self-pay | Admitting: Family Medicine

## 2018-12-06 LAB — LIPID PANEL
Cholesterol: 193 mg/dL (ref 0–200)
HDL: 58.9 mg/dL (ref >39.00)
LDL Cholesterol: 115 mg/dL — ABNORMAL HIGH (ref 0–99)
NonHDL: 134.45
Total CHOL/HDL Ratio: 3
Triglycerides: 97 mg/dL (ref 0.0–149.0)
VLDL: 19.4 mg/dL (ref 0.0–40.0)

## 2018-12-06 LAB — CBC
HCT: 38.9 % (ref 36.0–46.0)
Hemoglobin: 12.5 g/dL (ref 12.0–15.0)
MCHC: 32.2 g/dL (ref 30.0–36.0)
MCV: 85 fl (ref 78.0–100.0)
Platelets: 295 10*3/uL (ref 150.0–400.0)
RBC: 4.57 Mil/uL (ref 3.87–5.11)
RDW: 12.7 % (ref 11.5–15.5)
WBC: 9.3 10*3/uL (ref 4.0–10.5)

## 2018-12-06 LAB — COMPREHENSIVE METABOLIC PANEL
ALT: 16 U/L (ref 0–35)
AST: 18 U/L (ref 0–37)
Albumin: 4.7 g/dL (ref 3.5–5.2)
Alkaline Phosphatase: 128 U/L — ABNORMAL HIGH (ref 39–117)
BUN: 14 mg/dL (ref 6–23)
CO2: 26 mEq/L (ref 19–32)
Calcium: 9.8 mg/dL (ref 8.4–10.5)
Chloride: 102 mEq/L (ref 96–112)
Creatinine, Ser: 0.82 mg/dL (ref 0.40–1.20)
GFR: 96.75 mL/min (ref >60.00)
Glucose, Bld: 86 mg/dL (ref 70–99)
Potassium: 4.1 mEq/L (ref 3.5–5.1)
Sodium: 138 mEq/L (ref 135–145)
Total Bilirubin: 0.3 mg/dL (ref 0.2–1.2)
Total Protein: 7.7 g/dL (ref 6.0–8.3)

## 2018-12-06 LAB — GAMMA GT: GGT: 14 U/L (ref 7–51)

## 2018-12-06 LAB — TSH: TSH: 0.66 u[IU]/mL (ref 0.35–4.50)

## 2018-12-06 NOTE — Assessment & Plan Note (Signed)
Increase leafy greens, consider increased lean red meat and using cast iron cookware. Continue to monitor, report any concerns 

## 2018-12-06 NOTE — Assessment & Plan Note (Signed)
Encouraged heart healthy diet, increase exercise, avoid trans fats, consider a krill oil cap daily 

## 2018-12-06 NOTE — Progress Notes (Signed)
Subjective:    Patient ID: Megan Hunt, female    DOB: 31-Jan-1986, 33 y.o.   MRN: [id number]  No chief complaint on file.   HPI Patient is in today for annual preventative exam and follow up on chronic medical concerns including anxiety, hyperlipidemia and more. Her youngest child is 34 year old and she has struggled with some depression and anxiety. She has been noting left upper chest wall pain which occurs every couple of days and often occurs with bending, lifting etc. She has not had SOB, palpitations with it. No recent febrile illness or hospitalizations. No acute concerns. Denies palp/SOB/HA/congestion/fevers/GI or GU c/o. Taking meds as prescribed  Past Medical History:  Diagnosis Date  . Allergic rhinitis   . Allergy   . Anemia   . Anxiety   . Anxiety and depression   . Asthma   . Asthma, mild intermittent 10/27/2008   Qualifier: Diagnosis of  By: Kriste BasqueNadel MD, Lonzo CloudScott M   . Atypical chest pain   . Back pain   . Constipation   . Diabetes mellitus without complication (HCC)   . History of migraines   . Hypercholesteremia   . Hypertension    no longer taking meds  . Insomnia 05/15/2016  . Somatic dysfunction     Past Surgical History:  Procedure Laterality Date  . WISDOM TOOTH EXTRACTION      Family History  Problem Relation Age of Onset  . Hypertension Father   . Hypertension Mother   . Arthritis Brother   . Stroke Maternal Grandfather   . Hypertension Maternal Grandfather   . Cancer Sister   . Birth defects Paternal Aunt     Social History   Socioeconomic History  . Marital status: Married    Spouse name: Not on file  . Number of children: 2  . Years of education: Not on file  . Highest education level: Not on file  Occupational History  . 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 Financial resource strain: Not on file  . Food insecurity    Worry: Not on file   Inability: Not on file  . Transportation needs    Medical: Not on file    Non-medical: Not on file  Tobacco Use  . Smoking status: Never Smoker  . Smokeless tobacco: Never Used  Substance and Sexual Activity  . Alcohol use: No  . Drug use: No  . Sexual activity: Not Currently    Birth control/protection: None    Comment: lives with husband and 3 small children, no dietary restrictions, stays at home  Lifestyle  . Physical activity    Days per week: Not on file    Minutes per session: Not on file  . Stress: Not on file  Relationships  . Social Musicianconnections    Talks on phone: Not on file    Gets together: Not on file    Attends religious service: Not on file    Active member of club or organization: Not on file    Attends meetings of clubs or organizations: Not on file    Relationship status: Not on file  . Intimate partner violence    Fear of current or ex partner: Not on file    Emotionally abused: Not on file    Physically abused: Not on file    Forced sexual activity: Not on file  Other Topics  Concern  . Not on file  Social History Narrative   Son Martinique born 02/2008 and daughter Eulogio Ditch born 06/2009   Vonna Kotyk born 03/22/2013    Outpatient Medications Prior to Visit  Medication Sig Dispense Refill  . amoxicillin-clavulanate (AUGMENTIN) 875-125 MG tablet Take 1 tablet by mouth 2 (two) times daily. 20 tablet 0  . fluticasone (FLONASE) 50 MCG/ACT nasal spray Place 2 sprays into both nostrils daily. 16 g 6  . loratadine (CLARITIN) 10 MG tablet Take 1 tablet (10 mg total) by mouth daily as needed for allergies. 30 tablet 5  . mupirocin ointment (BACTROBAN) 2 % Place 1 application into the nose 2 (two) times daily. 22 g 0   No facility-administered medications prior to visit.     No Known Allergies  Review of Systems  Constitutional: Positive for diaphoresis. Negative for chills, fever and malaise/fatigue.  HENT: Negative for congestion and hearing loss.   Eyes: Negative  for discharge.  Respiratory: Negative for cough, sputum production and shortness of breath.   Cardiovascular: Positive for chest pain. Negative for palpitations and leg swelling.  Gastrointestinal: Negative for abdominal pain, blood in stool, constipation, diarrhea, heartburn, nausea and vomiting.  Genitourinary: Negative for dysuria, frequency, hematuria and urgency.  Musculoskeletal: Negative for back pain, falls and myalgias.  Skin: Negative for rash.  Neurological: Negative for dizziness, sensory change, loss of consciousness, weakness and headaches.  Endo/Heme/Allergies: Negative for environmental allergies. Does not bruise/bleed easily.  Psychiatric/Behavioral: Negative for depression and suicidal ideas. The patient is not nervous/anxious and does not have insomnia.        Objective:    Physical Exam Constitutional:      General: She is not in acute distress.    Appearance: She is well-developed.  HENT:     Head: Normocephalic and atraumatic.  Eyes:     Conjunctiva/sclera: Conjunctivae normal.  Neck:     Musculoskeletal: Neck supple.     Thyroid: No thyromegaly.  Cardiovascular:     Rate and Rhythm: Normal rate and regular rhythm.     Heart sounds: Normal heart sounds. No murmur.  Pulmonary:     Effort: Pulmonary effort is normal. No respiratory distress.     Breath sounds: Normal breath sounds.  Abdominal:     General: Bowel sounds are normal. There is no distension.     Palpations: Abdomen is soft. There is no mass.     Tenderness: There is no abdominal tenderness.  Lymphadenopathy:     Cervical: No cervical adenopathy.  Skin:    General: Skin is warm and dry.  Neurological:     Mental Status: She is alert and oriented to person, place, and time.  Psychiatric:        Behavior: Behavior normal.     BP 134/82   Pulse 82   Temp 98.2 F (36.8 C) (Oral)   Resp 18   Wt 188 lb (85.3 kg)   SpO2 99%   BMI 32.27 kg/m  Wt Readings from Last 3 Encounters:   12/05/18 188 lb (85.3 kg)  07/13/18 205 lb (93 kg)  11/29/17 192 lb (87.1 kg)    Diabetic Foot Exam - Simple   No data filed     Lab Results  Component Value Date   WBC 9.3 12/05/2018   HGB 12.5 12/05/2018   HCT 38.9 12/05/2018   PLT 295.0 12/05/2018   GLUCOSE 86 12/05/2018   CHOL 193 12/05/2018   TRIG 97.0 12/05/2018   HDL 58.90 12/05/2018  LDLDIRECT 177.4 06/15/2013   LDLCALC 115 (H) 12/05/2018   ALT 16 12/05/2018   AST 18 12/05/2018   NA 138 12/05/2018   K 4.1 12/05/2018   CL 102 12/05/2018   CREATININE 0.82 12/05/2018   BUN 14 12/05/2018   CO2 26 12/05/2018   TSH 0.66 12/05/2018    Lab Results  Component Value Date   TSH 0.66 12/05/2018   Lab Results  Component Value Date   WBC 9.3 12/05/2018   HGB 12.5 12/05/2018   HCT 38.9 12/05/2018   MCV 85.0 12/05/2018   PLT 295.0 12/05/2018   Lab Results  Component Value Date   NA 138 12/05/2018   K 4.1 12/05/2018   CO2 26 12/05/2018   GLUCOSE 86 12/05/2018   BUN 14 12/05/2018   CREATININE 0.82 12/05/2018   BILITOT 0.3 12/05/2018   ALKPHOS 128 (H) 12/05/2018   AST 18 12/05/2018   ALT 16 12/05/2018   PROT 7.7 12/05/2018   ALBUMIN 4.7 12/05/2018   CALCIUM 9.8 12/05/2018   ANIONGAP 11 11/02/2017   GFR 96.75 12/05/2018   Lab Results  Component Value Date   CHOL 193 12/05/2018   Lab Results  Component Value Date   HDL 58.90 12/05/2018   Lab Results  Component Value Date   LDLCALC 115 (H) 12/05/2018   Lab Results  Component Value Date   TRIG 97.0 12/05/2018   Lab Results  Component Value Date   CHOLHDL 3 12/05/2018   No results found for: HGBA1C     Assessment & Plan:   Problem List Items Addressed This Visit    HYPERCHOLESTEROLEMIA    Encouraged heart healthy diet, increase exercise, avoid trans fats, consider a krill oil cap daily      ANXIETY DEPRESSION    The tyounges of her 4 children is a year now and she feels she is doing better.       Essential hypertension    Improved on  recheck, Well controlled, no changes to meds. Encouraged heart healthy diet such as the DASH diet and exercise as tolerated.       Relevant Orders   CBC (Completed)   Comprehensive metabolic panel (Completed)   TSH (Completed)   CHEST PAIN, ATYPICAL    EKG normal and pain elicited and improved with movement. She will report changes or associated symptoms      Preventative health care    Patient encouraged to maintain heart healthy diet, regular exercise, adequate sleep. Consider daily probiotics. Take medications as prescribed. Labs reviewed      Anemia    Increase leafy greens, consider increased lean red meat and using cast iron cookware. Continue to monitor, report any concerns       Other Visit Diagnoses    Atypical chest pain    -  Primary   Relevant Orders   EKG 12-Lead (Completed)   Hypercholesteremia       Relevant Orders   Lipid panel (Completed)   Elevated alkaline phosphatase level       Relevant Orders   Gamma GT (Completed)      I have discontinued Adelle M. Witherspoon's mupirocin ointment, fluticasone, loratadine, and amoxicillin-clavulanate.  No orders of the defined types were placed in this encounter.    Danise EdgeStacey , MD

## 2018-12-06 NOTE — Assessment & Plan Note (Signed)
EKG normal and pain elicited and improved with movement. She will report changes or associated symptoms

## 2018-12-06 NOTE — Assessment & Plan Note (Signed)
The tyounges of her 4 children is a year now and she feels she is doing better.

## 2018-12-06 NOTE — Assessment & Plan Note (Signed)
Improved on recheck, Well controlled, no changes to meds. Encouraged heart healthy diet such as the DASH diet and exercise as tolerated.  

## 2018-12-12 ENCOUNTER — Encounter: Payer: Self-pay | Admitting: Family Medicine

## 2019-02-27 ENCOUNTER — Other Ambulatory Visit: Payer: Self-pay

## 2019-02-27 DIAGNOSIS — Z20822 Contact with and (suspected) exposure to covid-19: Secondary | ICD-10-CM

## 2019-03-01 LAB — NOVEL CORONAVIRUS, NAA: SARS-CoV-2, NAA: NOT DETECTED

## 2019-05-11 NOTE — L&D Delivery Note (Addendum)
Patient: Megan Hunt MRN: [id number]  GBS status: Unknown - PCN given due to preterm status  Patient is a 34 y.o. now [id number] s/p NSVD at [redacted]w[redacted]d, who was admitted for IOL 2/2preeclampsia with severe features superimposedoncHTN. AROM 4h 69m prior to delivery with clear fluid.   Initial SVE was 1/thick/ballotable. Patient was given Cytotec x1 with minimal change. A foley bulb was placed at 2030 via sterile speculum exam with repeat dose of Cytotec. Patient progressed to 4.5/50/-3 and Pitocin was started. AROM was performed at 0225 and patient ultimately progressed to 10/100/0 at 0626.   Delivery Note After progressing to complete, patient began pushing. Head delivered LOA. Loose nuchal cord present and reduced. Shoulder and body delivered in usual fashion. Infant with spontaneous cry, placed on mother's abdomen, dried and bulb suctioned. Cord clamped x 2 after 1-minute delay, and cut by family member. Cord blood drawn. Placenta delivered spontaneously with gentle cord traction. Fundus firm with massage and Pitocin. Cytotec 800 mcg and TXA 1000 mg given due to hx of PPH. No excessive bleeding noted. Perineum inspected and found to be intact.  At 6:29 AM a viable female was delivered via Vaginal, Spontaneous (Presentation: Left Occiput Anterior).  APGAR: 9, 9; weight per medical record.   Placenta status: Spontaneous, Intact.  Cord: 3 vessels with the following complications: None.    Anesthesia: Epidural Episiotomy: None Lacerations: None Est. Blood Loss (mL): 200  Mom to postpartum.  Baby to NICU.  Worthy Rancher, MD Family Medicine PGY-3 11/28/2019, 6:47 AM  OB FELLOW DELIVERY ATTESTATION  I was gloved and present for the delivery in its entirety, and I agree with the above resident's note.    Jerilynn Birkenhead, MD [facility] Family Medicine Fellow, [facility] for [employer], [facility]

## 2019-06-05 ENCOUNTER — Other Ambulatory Visit: Payer: Self-pay

## 2019-06-05 ENCOUNTER — Ambulatory Visit (INDEPENDENT_AMBULATORY_CARE_PROVIDER_SITE_OTHER): Payer: 59 | Admitting: Advanced Practice Midwife

## 2019-06-05 ENCOUNTER — Encounter: Payer: Self-pay | Admitting: Advanced Practice Midwife

## 2019-06-05 VITALS — BP 128/68 | HR 84 | Wt 201.0 lb

## 2019-06-05 DIAGNOSIS — Z124 Encounter for screening for malignant neoplasm of cervix: Secondary | ICD-10-CM | POA: Diagnosis not present

## 2019-06-05 DIAGNOSIS — Z113 Encounter for screening for infections with a predominantly sexual mode of transmission: Secondary | ICD-10-CM | POA: Diagnosis not present

## 2019-06-05 DIAGNOSIS — O219 Vomiting of pregnancy, unspecified: Secondary | ICD-10-CM

## 2019-06-05 DIAGNOSIS — I10 Essential (primary) hypertension: Secondary | ICD-10-CM

## 2019-06-05 DIAGNOSIS — Z3480 Encounter for supervision of other normal pregnancy, unspecified trimester: Secondary | ICD-10-CM | POA: Insufficient documentation

## 2019-06-05 DIAGNOSIS — Z3A01 Less than 8 weeks gestation of pregnancy: Secondary | ICD-10-CM

## 2019-06-05 MED ORDER — DOXYLAMINE-PYRIDOXINE 10-10 MG PO TBEC: 2.0000 | DELAYED_RELEASE_TABLET | Freq: Every evening | ORAL | 1 refills | Status: DC | PRN

## 2019-06-05 NOTE — Progress Notes (Signed)
DATING AND VIABILITY SONOGRAM   Megan Hunt is a 34 y.o. year old G23P4004 with LMP No LMP recorded. Patient is pregnant. which would correlate to  Unknown weeks gestation.  She has regular menstrual cycles.   She is here today for a confirmatory initial sonogram.    GESTATION: SINGLETON    FETAL ACTIVITY:          Heart rate         170 bpm          The fetus is active.   GESTATIONAL AGE AND  BIOMETRICS:  Gestational criteria: Estimated Date of Delivery: None noted. by early ultrasound now at Unknown  Previous Scans:0  GESTATIONAL SAC           cm        8-5 weeks  CROWN RUMP LENGTH           2.47 cm         9-2 weeks                                                                               AVERAGE EGA(BY THIS SCAN):  9-0 weeks  WORKING EDD( early ultrasound ):  01/08/2020   TECHNICIAN COMMENTS: Patient informed that the ultrasound is considered a limited obstetric ultrasound and is not intended to be a complete ultrasound exam. Patient also informed that the ultrasound is not being completed with the intent of assessing for fetal or placental anomalies or any pelvic abnormalities. Explained that the purpose of today's ultrasound is to assess for fetal heart rate. Patient acknowledges the purpose of the exam and the limitations of the study   Armandina Stammer 06/05/2019 10:20 AM

## 2019-06-05 NOTE — Patient Instructions (Signed)
First Trimester of Pregnancy  The first trimester of pregnancy is from week 1 until the end of week 13 (months 1 through 3). During this time, your baby will begin to develop inside you. At 6-8 weeks, the eyes and face are formed, and the heartbeat can be seen on ultrasound. At the end of 12 weeks, all the baby's organs are formed. Prenatal care is all the medical care you receive before the birth of your baby. Make sure you get good prenatal care and follow all of your doctor's instructions. Follow these instructions at home: Medicines  Take over-the-counter and prescription medicines only as told by your doctor. Some medicines are safe and some medicines are not safe during pregnancy.  Take a prenatal vitamin that contains at least 600 micrograms (mcg) of folic acid.  If you have trouble pooping (constipation), take medicine that will make your stool soft (stool softener) if your doctor approves. Eating and drinking   Eat regular, healthy meals.  Your doctor will tell you the amount of weight gain that is right for you.  Avoid raw meat and uncooked cheese.  If you feel sick to your stomach (nauseous) or throw up (vomit): ? Eat 4 or 5 small meals a day instead of 3 large meals. ? Try eating a few soda crackers. ? Drink liquids between meals instead of during meals.  To prevent constipation: ? Eat foods that are high in fiber, like fresh fruits and vegetables, whole grains, and beans. ? Drink enough fluids to keep your pee (urine) clear or pale yellow. Activity  Exercise only as told by your doctor. Stop exercising if you have cramps or pain in your lower belly (abdomen) or low back.  Do not exercise if it is too hot, too humid, or if you are in a place of great height (high altitude).  Try to avoid standing for long periods of time. Move your legs often if you must stand in one place for a long time.  Avoid heavy lifting.  Wear low-heeled shoes. Sit and stand up  straight.  You can have sex unless your doctor tells you not to. Relieving pain and discomfort  Wear a good support bra if your breasts are sore.  Take warm water baths (sitz baths) to soothe pain or discomfort caused by hemorrhoids. Use hemorrhoid cream if your doctor says it is okay.  Rest with your legs raised if you have leg cramps or low back pain.  If you have puffy, bulging veins (varicose veins) in your legs: ? Wear support hose or compression stockings as told by your doctor. ? Raise (elevate) your feet for 15 minutes, 3-4 times a day. ? Limit salt in your food. Prenatal care  Schedule your prenatal visits by the twelfth week of pregnancy.  Write down your questions. Take them to your prenatal visits.  Keep all your prenatal visits as told by your doctor. This is important. Safety  Wear your seat belt at all times when driving.  Make a list of emergency phone numbers. The list should include numbers for family, friends, the hospital, and police and fire departments. General instructions  Ask your doctor for a referral to a local prenatal class. Begin classes no later than at the start of month 6 of your pregnancy.  Ask for help if you need counseling or if you need help with nutrition. Your doctor can give you advice or tell you where to go for help.  Do not use hot tubs, steam   rooms, or saunas.  Do not douche or use tampons or scented sanitary pads.  Do not cross your legs for long periods of time.  Avoid all herbs and alcohol. Avoid drugs that are not approved by your doctor.  Do not use any tobacco products, including cigarettes, chewing tobacco, and electronic cigarettes. If you need help quitting, ask your doctor. You may get counseling or other support to help you quit.  Avoid cat litter boxes and soil used by cats. These carry germs that can cause birth defects in the baby and can cause a loss of your baby (miscarriage) or stillbirth.  Visit your dentist.  At home, brush your teeth with a soft toothbrush. Be gentle when you floss. Contact a doctor if:  You are dizzy.  You have mild cramps or pressure in your lower belly.  You have a nagging pain in your belly area.  You continue to feel sick to your stomach, you throw up, or you have watery poop (diarrhea).  You have a bad smelling fluid coming from your vagina.  You have pain when you pee (urinate).  You have increased puffiness (swelling) in your face, hands, legs, or ankles. Get help right away if:  You have a fever.  You are leaking fluid from your vagina.  You have spotting or bleeding from your vagina.  You have very bad belly cramping or pain.  You gain or lose weight rapidly.  You throw up blood. It may look like coffee grounds.  You are around people who have German measles, fifth disease, or chickenpox.  You have a very bad headache.  You have shortness of breath.  You have any kind of trauma, such as from a fall or a car accident. Summary  The first trimester of pregnancy is from week 1 until the end of week 13 (months 1 through 3).  To take care of yourself and your unborn baby, you will need to eat healthy meals, take medicines only if your doctor tells you to do so, and do activities that are safe for you and your baby.  Keep all follow-up visits as told by your doctor. This is important as your doctor will have to ensure that your baby is healthy and growing well. This information is not intended to replace advice given to you by your health care provider. Make sure you discuss any questions you have with your health care provider. Document Revised: 08/17/2018 Document Reviewed: 05/04/2016 Elsevier Patient Education  2020 [employer]  

## 2019-06-05 NOTE — Progress Notes (Signed)
Subjective:    Megan Hunt is a [id number] at [redacted] weeks gestation  being seen today for her first obstetrical visit.  Her obstetrical history is significant for advanced maternal age and chronic hypertension (off meds now), hx PPH, anxiety and depression, asthma. Patient does intend to breast feed. Pregnancy history fully reviewed.  Patient reports nausea.  Vitals:   06/05/19 0945 06/05/19 0953  BP: (!) 141/79 128/68  Pulse: 99 84  Weight: 201 lb 0.6 oz (91.2 kg)     HISTORY: OB History  Gravida Para Term Preterm AB Living  5 4 4     4   SAB TAB Ectopic Multiple Live Births        0 4    # Outcome Date GA Lbr Len/2nd Weight Sex Delivery Anes PTL Lv  5 Current           4 Term 10/25/17 [redacted]w[redacted]d 00:39 / 00:03 6 lb 6.5 oz (2.906 kg) F Vag-Spont EPI  LIV  3 Term 03/22/13 [redacted]w[redacted]d 18:01 / 00:24 6 lb 11.9 oz (3.06 kg) M Vag-Spont EPI  LIV     Birth Comments: WNL  2 Term 06/28/09 [redacted]w[redacted]d   F Vag-Spont   LIV  1 Term 02/18/08    Megan Hunt   LIV   Past Medical History:  Diagnosis Date  . Allergic rhinitis   . Allergy   . Anemia   . Anxiety   . Anxiety and depression   . Asthma   . Asthma, mild intermittent 10/27/2008   Qualifier: Diagnosis of  By: Lenna Gilford MD, Deborra Medina   . Atypical chest pain   . Back pain   . Constipation   . Diabetes mellitus without complication ([place])   . History of migraines   . Hypercholesteremia   . Hypertension    no longer taking meds  . Insomnia 05/15/2016  . Somatic dysfunction    Past Surgical History:  Procedure Laterality Date  . WISDOM TOOTH EXTRACTION     Family History  Problem Relation Age of Onset  . Hypertension Father   . Hypertension Mother   . Arthritis Brother   . Stroke Maternal Grandfather   . Hypertension Maternal Grandfather   . Cancer Sister   . Birth defects Paternal Aunt      Exam    Uterus:     Pelvic Exam:    Perineum: No Hemorrhoids, Normal Perineum   Vulva: Bartholin's, Urethra, Skene's normal   Vagina:  normal mucosa,  normal discharge   pH:    Cervix: no cervical motion tenderness   Adnexa: no mass, fullness, tenderness   Bony Pelvis: gynecoid  System: Breast:  normal appearance, no masses or tenderness   Skin: normal coloration and turgor, no rashes    Neurologic: oriented, grossly non-focal   Extremities: normal strength, tone, and muscle mass   HEENT neck supple with midline trachea   Mouth/Teeth mucous membranes moist, pharynx normal without lesions   Neck supple   Cardiovascular: regular rate and rhythm   Respiratory:  appears well, vitals normal, no respiratory distress, acyanotic, normal RR, ear and throat exam is normal, neck free of mass or lymphadenopathy, chest clear, no wheezing, crepitations, rhonchi, normal symmetric air entry   Abdomen: soft, non-tender; bowel sounds normal; no masses,  no organomegaly   Urinary: urethral meatus normal      Assessment:    Pregnancy: [id number] Patient Active Problem List   Diagnosis Date Noted  . Supervision of other normal pregnancy, antepartum 06/05/2019  .  Sinusitis 07/13/2018  . Preventative health care 11/27/2017  . Anemia 11/27/2017  . Depression affecting pregnancy, antepartum 06/01/2017  . Insomnia 05/15/2016  . Fatigue 08/08/2014  . Hemorrhoids, external 01/05/2011  . Fibromyalgia 01/05/2011  . Constipation 01/03/2010  . Essential hypertension 09/29/2009  . HYPERCHOLESTEROLEMIA 10/27/2008  . ANXIETY DEPRESSION 10/27/2008  . Allergic rhinitis 10/27/2008  . Asthma, mild intermittent 10/27/2008  . Backache 10/27/2008  . SOMATIC DYSFUNCTION 10/27/2008  . CHEST PAIN, ATYPICAL 10/27/2008  . Migraine 03/29/2007        Plan:     Initial labs drawn. Prenatal vitamins. Problem list reviewed and updated. Genetic Screening discussed Integrated Screen: declined.  Ultrasound discussed; fetal survey: requested.  Follow up in 4 weeks. 50% of 30 min visit spent on counseling and coordination of care.  Welcomed back to  practice Routines reviewed Plan to start baby ASA after 12 weeks Preeclampsia baseline labs added Advised to keep appt with primary MD to discuss HTN  (off meds now)  Discussed second trimester often shows lower BP, so will watch and add Labetalol PRN   Megan Hunt 06/05/2019

## 2019-06-06 LAB — OBSTETRIC PANEL, INCLUDING HIV
Antibody Screen: NEGATIVE
Basophils Absolute: 0.1 10*3/uL (ref 0.0–0.2)
Basos: 1 %
EOS (ABSOLUTE): 0.1 10*3/uL (ref 0.0–0.4)
Eos: 1 %
HIV Screen 4th Generation wRfx: NONREACTIVE
Hematocrit: 36.3 % (ref 34.0–46.6)
Hemoglobin: 12.1 g/dL (ref 11.1–15.9)
Hepatitis B Surface Ag: NEGATIVE
Immature Grans (Abs): 0 10*3/uL (ref 0.0–0.1)
Immature Granulocytes: 0 %
Lymphocytes Absolute: 2.4 10*3/uL (ref 0.7–3.1)
Lymphs: 20 %
MCH: 27.9 pg (ref 26.6–33.0)
MCHC: 33.3 g/dL (ref 31.5–35.7)
MCV: 84 fL (ref 79–97)
Monocytes Absolute: 1 10*3/uL — ABNORMAL HIGH (ref 0.1–0.9)
Monocytes: 8 %
Neutrophils Absolute: 8.2 10*3/uL — ABNORMAL HIGH (ref 1.4–7.0)
Neutrophils: 70 %
Platelets: 295 10*3/uL (ref 150–450)
RBC: 4.34 x10E6/uL (ref 3.77–5.28)
RDW: 13 % (ref 11.7–15.4)
RPR Ser Ql: NONREACTIVE
Rh Factor: POSITIVE
Rubella Antibodies, IGG: 3.5 index (ref >0.99)
WBC: 11.8 10*3/uL — ABNORMAL HIGH (ref 3.4–10.8)

## 2019-06-06 LAB — COMPREHENSIVE METABOLIC PANEL
ALT: 11 IU/L (ref 0–32)
AST: 12 IU/L (ref 0–40)
Albumin/Globulin Ratio: 1.7 (ref 1.2–2.2)
Albumin: 4.5 g/dL (ref 3.8–4.8)
Alkaline Phosphatase: 128 IU/L — ABNORMAL HIGH (ref 39–117)
BUN/Creatinine Ratio: 11 (ref 9–23)
BUN: 7 mg/dL (ref 6–20)
Bilirubin Total: 0.3 mg/dL (ref 0.0–1.2)
CO2: 19 mmol/L — ABNORMAL LOW (ref 20–29)
Calcium: 9.4 mg/dL (ref 8.7–10.2)
Chloride: 100 mmol/L (ref 96–106)
Creatinine, Ser: 0.65 mg/dL (ref 0.57–1.00)
GFR calc Af Amer: 134 mL/min/{1.73_m2} (ref >59)
GFR calc non Af Amer: 116 mL/min/{1.73_m2} (ref >59)
Globulin, Total: 2.6 g/dL (ref 1.5–4.5)
Glucose: 83 mg/dL (ref 65–99)
Potassium: 4.3 mmol/L (ref 3.5–5.2)
Sodium: 136 mmol/L (ref 134–144)
Total Protein: 7.1 g/dL (ref 6.0–8.5)

## 2019-06-06 LAB — PROTEIN / CREATININE RATIO, URINE
Creatinine, Urine: 219.6 mg/dL
Protein, Ur: 21.6 mg/dL
Protein/Creat Ratio: 98 mg/g creat (ref 0–200)

## 2019-06-06 LAB — HEMOGLOBIN A1C
Est. average glucose Bld gHb Est-mCnc: 111 mg/dL
Hgb A1c MFr Bld: 5.5 % (ref 4.8–5.6)

## 2019-06-07 ENCOUNTER — Ambulatory Visit: Payer: 59 | Admitting: Family Medicine

## 2019-06-07 LAB — URINE CULTURE

## 2019-06-12 LAB — CYTOLOGY - PAP
Chlamydia: NEGATIVE
Comment: NEGATIVE
Comment: NEGATIVE
Comment: NORMAL
Diagnosis: NEGATIVE
Diagnosis: REACTIVE
High risk HPV: NEGATIVE
Neisseria Gonorrhea: NEGATIVE

## 2019-07-10 ENCOUNTER — Ambulatory Visit (INDEPENDENT_AMBULATORY_CARE_PROVIDER_SITE_OTHER): Payer: 59 | Admitting: Advanced Practice Midwife

## 2019-07-10 ENCOUNTER — Encounter: Payer: Self-pay | Admitting: Advanced Practice Midwife

## 2019-07-10 VITALS — BP 154/91 | HR 97 | Wt 210.0 lb

## 2019-07-10 DIAGNOSIS — O10912 Unspecified pre-existing hypertension complicating pregnancy, second trimester: Secondary | ICD-10-CM

## 2019-07-10 DIAGNOSIS — Z3480 Encounter for supervision of other normal pregnancy, unspecified trimester: Secondary | ICD-10-CM

## 2019-07-10 DIAGNOSIS — O10919 Unspecified pre-existing hypertension complicating pregnancy, unspecified trimester: Secondary | ICD-10-CM

## 2019-07-10 DIAGNOSIS — Z3A14 14 weeks gestation of pregnancy: Secondary | ICD-10-CM

## 2019-07-10 MED ORDER — LABETALOL HCL 200 MG PO TABS: 200.0000 mg | ORAL_TABLET | Freq: Two times a day (BID) | ORAL | 3 refills | Status: DC

## 2019-07-10 NOTE — Progress Notes (Signed)
   PRENATAL VISIT NOTE  Subjective:  Megan Hunt is a 34 y.o. [id number] at [redacted]w[redacted]d being seen today for ongoing prenatal care.  She is currently monitored for the following issues for this high-risk pregnancy and has HYPERCHOLESTEROLEMIA; ANXIETY DEPRESSION; Essential hypertension; Allergic rhinitis; Asthma, mild intermittent; Constipation; Backache; SOMATIC DYSFUNCTION; CHEST PAIN, ATYPICAL; Migraine; Hemorrhoids, external; Fibromyalgia; Fatigue; Insomnia; Depression affecting pregnancy, antepartum; Preventative health care; Anemia; Sinusitis; and Supervision of other normal pregnancy, antepartum on their problem list.  Patient reports headache.  Contractions: Not present. Vag. Bleeding: None.  Movement: Absent. Denies leaking of fluid.   The following portions of the patient's history were reviewed and updated as appropriate: allergies, current medications, past family history, past medical history, past social history, past surgical history and problem list.   Objective:   Vitals:   07/10/19 0958 07/10/19 1003  BP: (!) 150/81 (!) 154/91  Pulse: 87 97  Weight: 210 lb (95.3 kg)     Fetal Status: Fetal Heart Rate (bpm): 151   Movement: Absent     General:  Alert, oriented and cooperative. Patient is in no acute distress.  Skin: Skin is warm and dry. No rash noted.   Cardiovascular: Normal heart rate noted  Respiratory: Normal respiratory effort, no problems with respiration noted  Abdomen: Soft, gravid, appropriate for gestational age.  Pain/Pressure: Present     Pelvic: Cervical exam deferred        Extremities: Normal range of motion.  Edema: Trace  Mental Status: Normal mood and affect. Normal behavior. Normal judgment and thought content.   Assessment and Plan:  Pregnancy: G5P4004 at [redacted]w[redacted]d 1. Chronic hypertension during pregnancy, antepartum      BPs elevated this visit with headache      As discussed with her before, will start Labetalol 200mg  bid      Recheck BP Friday or  Monday, adjust med dose as needed  - Friday MFM OB DETAIL +14 WK; Future  2. Supervision of other normal pregnancy, antepartum      Declined Panorama/testing - [place] MFM OB DETAIL +14 WK; Future  Preterm labor symptoms and general obstetric precautions including but not limited to vaginal bleeding, contractions, leaking of fluid and fetal movement were reviewed in detail with the patient. Please refer to After Visit Summary for other counseling recommendations.   Return in about 1 week (around 07/17/2019) for [facility], For BP check.  Future Appointments  Date Time Provider Department Center  07/16/2019  4:00 PM [facility] NURSE [facility] None  08/14/2019  8:15 AM [facility] 10/14/2019 4 [facility]  08/14/2019 10:30 AM 10/14/2019, CNM [facility] None  01/29/2020  9:20 AM 01/31/2020, MD [facility]    Bradd Canary, CNM

## 2019-07-10 NOTE — Patient Instructions (Signed)

## 2019-07-16 ENCOUNTER — Ambulatory Visit: Payer: 59

## 2019-07-16 ENCOUNTER — Other Ambulatory Visit: Payer: Self-pay

## 2019-07-16 VITALS — BP 138/75 | HR 92 | Wt 215.1 lb

## 2019-07-16 DIAGNOSIS — Z0130 Encounter for examination of blood pressure without abnormal findings: Secondary | ICD-10-CM

## 2019-07-16 NOTE — Progress Notes (Addendum)
Pt presents to the office for a BP check. Pt;s BP was 138/75.Pt states she has had one dose of Labetalol today. Pt advised to continue to monitor her BP at home.  Megan Hunt l Megan Hunt, CMA   Attestation of Attending Supervision of CMA/RN: Evaluation and management procedures were performed by the nurse under my supervision and collaboration.  I have reviewed the nursing note and chart, and I agree with the management and plan.  Carolyn L. Harraway-Smith, M.D., Evern Core

## 2019-07-20 ENCOUNTER — Telehealth: Payer: Self-pay

## 2019-07-20 NOTE — Telephone Encounter (Signed)
Pt sent Mychart message c/o headache and elevated  BP of 160/109 last night. Called pt this morning, pt states she checked her BP at 0800 and it was 160/89. Pt states she had just taken her medicine,will give office a f/u call after she rechecks BP in 1 hour. Advised pt to come into the office if BP is elevated after recheck. Understanding was voiced.  Warnie Belair l Lyndsee Casa, CMA

## 2019-08-06 ENCOUNTER — Encounter: Payer: 59 | Admitting: Obstetrics & Gynecology

## 2019-08-14 ENCOUNTER — Ambulatory Visit (INDEPENDENT_AMBULATORY_CARE_PROVIDER_SITE_OTHER): Payer: 59 | Admitting: Advanced Practice Midwife

## 2019-08-14 ENCOUNTER — Encounter (HOSPITAL_COMMUNITY): Payer: Self-pay

## 2019-08-14 ENCOUNTER — Ambulatory Visit (HOSPITAL_COMMUNITY)
Admission: RE | Admit: 2019-08-14 | Discharge: 2019-08-14 | Disposition: A | Discharge status: Home / Self Care | Payer: 59 | Source: Ambulatory Visit | Attending: Obstetrics and Gynecology | Admitting: Obstetrics and Gynecology

## 2019-08-14 ENCOUNTER — Ambulatory Visit (HOSPITAL_COMMUNITY): Payer: 59 | Admitting: *Deleted

## 2019-08-14 ENCOUNTER — Other Ambulatory Visit (HOSPITAL_COMMUNITY): Payer: Self-pay | Admitting: *Deleted

## 2019-08-14 ENCOUNTER — Other Ambulatory Visit: Payer: Self-pay

## 2019-08-14 ENCOUNTER — Encounter: Payer: Self-pay | Admitting: Advanced Practice Midwife

## 2019-08-14 VITALS — BP 140/92 | HR 92 | Temp 97.8°F

## 2019-08-14 VITALS — BP 147/83 | HR 87 | Wt 217.0 lb

## 2019-08-14 DIAGNOSIS — Z3480 Encounter for supervision of other normal pregnancy, unspecified trimester: Secondary | ICD-10-CM | POA: Insufficient documentation

## 2019-08-14 DIAGNOSIS — Z3A19 19 weeks gestation of pregnancy: Secondary | ICD-10-CM

## 2019-08-14 DIAGNOSIS — O10912 Unspecified pre-existing hypertension complicating pregnancy, second trimester: Secondary | ICD-10-CM | POA: Diagnosis present

## 2019-08-14 DIAGNOSIS — O10012 Pre-existing essential hypertension complicating pregnancy, second trimester: Secondary | ICD-10-CM

## 2019-08-14 DIAGNOSIS — Z363 Encounter for antenatal screening for malformations: Secondary | ICD-10-CM

## 2019-08-14 DIAGNOSIS — O10919 Unspecified pre-existing hypertension complicating pregnancy, unspecified trimester: Secondary | ICD-10-CM | POA: Insufficient documentation

## 2019-08-14 NOTE — Patient Instructions (Signed)

## 2019-08-14 NOTE — Progress Notes (Signed)
   PRENATAL VISIT NOTE  Subjective:  Megan Hunt is a 34 y.o. [id number] at [redacted]w[redacted]d being seen today for ongoing prenatal care.  She is currently monitored for the following issues for this high-risk pregnancy and has HYPERCHOLESTEROLEMIA; ANXIETY DEPRESSION; Essential hypertension; Allergic rhinitis; Asthma, mild intermittent; Constipation; Backache; SOMATIC DYSFUNCTION; CHEST PAIN, ATYPICAL; Migraine; Hemorrhoids, external; Fibromyalgia; Fatigue; Insomnia; Depression affecting pregnancy, antepartum; Preventative health care; Anemia; Sinusitis; and Supervision of other normal pregnancy, antepartum on their problem list.  Patient reports no complaints.  Contractions: Not present. Vag. Bleeding: None.  Movement: Present. Denies leaking of fluid.   The following portions of the patient's history were reviewed and updated as appropriate: allergies, current medications, past family history, past medical history, past social history, past surgical history and problem list.   Objective:   Vitals:   08/14/19 1027  BP: (!) 147/83  Pulse: 87  Weight: 217 lb (98.4 kg)    Fetal Status: Fetal Heart Rate (bpm): 150   Movement: Present     General:  Alert, oriented and cooperative. Patient is in no acute distress.  Skin: Skin is warm and dry. No rash noted.   Cardiovascular: Normal heart rate noted  Respiratory: Normal respiratory effort, no problems with respiration noted  Abdomen: Soft, gravid, appropriate for gestational age.  Pain/Pressure: Absent     Pelvic: Cervical exam deferred        Extremities: Normal range of motion.  Edema: Trace  Mental Status: Normal mood and affect. Normal behavior. Normal judgment and thought content.   Assessment and Plan:  Pregnancy: [id number] at [redacted]w[redacted]d Patient Active Problem List   Diagnosis Date Noted  . Supervision of other normal pregnancy, antepartum 06/05/2019  . Sinusitis 07/13/2018  . Preventative health care 11/27/2017  . Anemia 11/27/2017  . Depression  affecting pregnancy, antepartum 06/01/2017  . Insomnia 05/15/2016  . Fatigue 08/08/2014  . Hemorrhoids, external 01/05/2011  . Fibromyalgia 01/05/2011  . Constipation 01/03/2010  . Essential hypertension 09/29/2009  . HYPERCHOLESTEROLEMIA 10/27/2008  . ANXIETY DEPRESSION 10/27/2008  . Allergic rhinitis 10/27/2008  . Asthma, mild intermittent 10/27/2008  . Backache 10/27/2008  . SOMATIC DYSFUNCTION 10/27/2008  . CHEST PAIN, ATYPICAL 10/27/2008  . Migraine 03/29/2007   Had anatomy US today.  States Dr Judeth Cornfield told her everything looked good They plan regular growth USs  Discussed BPs   If consistently over 140s or 95, will increase Labetalol dose States BPs have been good at home  Preterm labor symptoms and general obstetric precautions including but not limited to vaginal bleeding, contractions, leaking of fluid and fetal movement were reviewed in detail with the patient. Please refer to After Visit Summary for other counseling recommendations.   Return in about 4 weeks (around 09/11/2019) for [facility].  Future Appointments  Date Time Provider Department Center  09/11/2019  9:30 AM Marny Lowenstein, PA-C [facility] None  09/11/2019  3:30 PM [facility] [place] 1 [facility] MFC-US  01/29/2020  9:20 AM Bradd Canary, MD [facility]    Wynelle Bourgeois, CNM

## 2019-08-30 ENCOUNTER — Other Ambulatory Visit: Payer: Self-pay

## 2019-08-30 ENCOUNTER — Ambulatory Visit (INDEPENDENT_AMBULATORY_CARE_PROVIDER_SITE_OTHER): Payer: 59 | Admitting: Family Medicine

## 2019-08-30 VITALS — BP 141/98 | HR 92 | Wt 222.0 lb

## 2019-08-30 DIAGNOSIS — Z3480 Encounter for supervision of other normal pregnancy, unspecified trimester: Secondary | ICD-10-CM

## 2019-08-30 DIAGNOSIS — G43009 Migraine without aura, not intractable, without status migrainosus: Secondary | ICD-10-CM

## 2019-08-30 DIAGNOSIS — O10919 Unspecified pre-existing hypertension complicating pregnancy, unspecified trimester: Secondary | ICD-10-CM

## 2019-08-30 DIAGNOSIS — G5600 Carpal tunnel syndrome, unspecified upper limb: Secondary | ICD-10-CM

## 2019-08-30 DIAGNOSIS — O9952 Diseases of the respiratory system complicating childbirth: Secondary | ICD-10-CM

## 2019-08-30 DIAGNOSIS — O26899 Other specified pregnancy related conditions, unspecified trimester: Secondary | ICD-10-CM

## 2019-08-30 DIAGNOSIS — J4520 Mild intermittent asthma, uncomplicated: Secondary | ICD-10-CM

## 2019-08-30 DIAGNOSIS — O99352 Diseases of the nervous system complicating pregnancy, second trimester: Secondary | ICD-10-CM

## 2019-08-30 DIAGNOSIS — O26892 Other specified pregnancy related conditions, second trimester: Secondary | ICD-10-CM

## 2019-08-30 DIAGNOSIS — O10012 Pre-existing essential hypertension complicating pregnancy, second trimester: Secondary | ICD-10-CM

## 2019-08-30 DIAGNOSIS — Z3A21 21 weeks gestation of pregnancy: Secondary | ICD-10-CM

## 2019-08-30 MED ORDER — LABETALOL HCL 200 MG PO TABS: 400.0000 mg | ORAL_TABLET | Freq: Two times a day (BID) | ORAL | 3 refills | Status: DC

## 2019-08-30 NOTE — Progress Notes (Signed)
   PRENATAL VISIT NOTE  Subjective:  Megan Hunt is a 34 y.o. [id number] at [redacted]w[redacted]d being seen today for ongoing prenatal care.  She is currently monitored for the following issues for this high-risk pregnancy and has HYPERCHOLESTEROLEMIA; ANXIETY DEPRESSION; Essential hypertension; Allergic rhinitis; Asthma, mild intermittent; Constipation; Backache; SOMATIC DYSFUNCTION; CHEST PAIN, ATYPICAL; Migraine; Hemorrhoids, external; Fibromyalgia; Fatigue; Insomnia; Depression affecting pregnancy, antepartum; Preventative health care; Anemia; Sinusitis; and Supervision of other normal pregnancy, antepartum on their problem list.  Patient reports carpal tunnel symptoms. Having some elevated BPs at home 150s/90s.  Contractions: Not present. Vag. Bleeding: None.  Movement: Present. Denies leaking of fluid.   The following portions of the patient's history were reviewed and updated as appropriate: allergies, current medications, past family history, past medical history, past social history, past surgical history and problem list.   Objective:   Vitals:   08/30/19 1048  BP: (!) 141/98  Pulse: 92  Weight: 222 lb (100.7 kg)    Fetal Status:     Movement: Present     General:  Alert, oriented and cooperative. Patient is in no acute distress.  Skin: Skin is warm and dry. No rash noted.   Cardiovascular: Normal heart rate noted  Respiratory: Normal respiratory effort, no problems with respiration noted  Abdomen: Soft, gravid, appropriate for gestational age.  Pain/Pressure: Absent     Pelvic: Cervical exam deferred        Extremities: Normal range of motion.  Edema: Trace  Mental Status: Normal mood and affect. Normal behavior. Normal judgment and thought content.   Assessment and Plan:  Pregnancy: G5P4004 at [redacted]w[redacted]d  1. Supervision of other normal pregnancy, antepartum FHT and FH normal.   2. Chronic hypertension during pregnancy, antepartum BP here 140s/90s. Increase labetalol to 400mg  BID. Serial  Continue ASA81mg   3. Migraine without aura and without status migrainosus, not intractable  4. Mild intermittent asthma without complication Controlled currently.   Preterm labor symptoms and general obstetric precautions including but not limited to vaginal bleeding, contractions, leaking of fluid and fetal movement were reviewed in detail with the patient. Please refer to After Visit Summary for other counseling recommendations.   Return in about 3 weeks (around 09/20/2019) for OB f/u, In Office.  Future Appointments  Date Time Provider Department Center  09/11/2019  3:30 PM [facility] NURSE [facility]  09/11/2019  3:30 PM [facility] 11/11/2019 1 [facility] MFC-US  01/29/2020  9:20 AM 01/31/2020, MD [facility]    Bradd Canary, DO

## 2019-08-30 NOTE — Progress Notes (Signed)
Patient complaining of swelling in hand and feet.

## 2019-09-11 ENCOUNTER — Ambulatory Visit: Payer: 59 | Admitting: *Deleted

## 2019-09-11 ENCOUNTER — Encounter: Payer: 59 | Admitting: Medical

## 2019-09-11 ENCOUNTER — Ambulatory Visit (HOSPITAL_COMMUNITY): Payer: 59 | Attending: Obstetrics and Gynecology

## 2019-09-11 ENCOUNTER — Encounter: Payer: Self-pay | Admitting: *Deleted

## 2019-09-11 ENCOUNTER — Other Ambulatory Visit: Payer: Self-pay

## 2019-09-11 DIAGNOSIS — O99212 Obesity complicating pregnancy, second trimester: Secondary | ICD-10-CM

## 2019-09-11 DIAGNOSIS — Z3480 Encounter for supervision of other normal pregnancy, unspecified trimester: Secondary | ICD-10-CM | POA: Insufficient documentation

## 2019-09-11 DIAGNOSIS — O10912 Unspecified pre-existing hypertension complicating pregnancy, second trimester: Secondary | ICD-10-CM | POA: Insufficient documentation

## 2019-09-11 DIAGNOSIS — Z3A23 23 weeks gestation of pregnancy: Secondary | ICD-10-CM

## 2019-09-11 DIAGNOSIS — O10012 Pre-existing essential hypertension complicating pregnancy, second trimester: Secondary | ICD-10-CM | POA: Diagnosis not present

## 2019-09-11 DIAGNOSIS — O321XX0 Maternal care for breech presentation, not applicable or unspecified: Secondary | ICD-10-CM

## 2019-09-11 DIAGNOSIS — Z362 Encounter for other antenatal screening follow-up: Secondary | ICD-10-CM | POA: Diagnosis not present

## 2019-09-12 ENCOUNTER — Other Ambulatory Visit: Payer: Self-pay | Admitting: *Deleted

## 2019-09-12 DIAGNOSIS — O10919 Unspecified pre-existing hypertension complicating pregnancy, unspecified trimester: Secondary | ICD-10-CM

## 2019-09-20 ENCOUNTER — Ambulatory Visit (INDEPENDENT_AMBULATORY_CARE_PROVIDER_SITE_OTHER): Payer: 59 | Admitting: Obstetrics & Gynecology

## 2019-09-20 ENCOUNTER — Other Ambulatory Visit: Payer: Self-pay

## 2019-09-20 VITALS — BP 156/81 | HR 95 | Wt 223.0 lb

## 2019-09-20 DIAGNOSIS — O10019 Pre-existing essential hypertension complicating pregnancy, unspecified trimester: Secondary | ICD-10-CM

## 2019-09-20 DIAGNOSIS — Z3480 Encounter for supervision of other normal pregnancy, unspecified trimester: Secondary | ICD-10-CM

## 2019-09-20 DIAGNOSIS — F341 Dysthymic disorder: Secondary | ICD-10-CM

## 2019-09-20 MED ORDER — LABETALOL HCL 300 MG PO TABS: 300.0000 mg | ORAL_TABLET | Freq: Two times a day (BID) | ORAL | 2 refills | Status: DC

## 2019-09-20 NOTE — Progress Notes (Signed)
   PRENATAL VISIT NOTE  Subjective:  Megan Hunt is a 34 y.o. [id number] at [redacted]w[redacted]d being seen today for ongoing prenatal care.  She is currently monitored for the following issues for this high-risk pregnancy and has HYPERCHOLESTEROLEMIA; ANXIETY DEPRESSION; Essential hypertension antepartum; Allergic rhinitis; Asthma, mild intermittent; Constipation; Backache; SOMATIC DYSFUNCTION; CHEST PAIN, ATYPICAL; Migraine; Hemorrhoids, external; Fibromyalgia; Fatigue; Insomnia; Depression affecting pregnancy, antepartum; Preventative health care; Anemia; Sinusitis; and Supervision of other normal pregnancy, antepartum on their problem list.  Patient reports no complaints.  Contractions: Not present. Vag. Bleeding: None.  Movement: Present. Denies leaking of fluid.   The following portions of the patient's history were reviewed and updated as appropriate: allergies, current medications, past family history, past medical history, past social history, past surgical history and problem list.   Objective:   Vitals:   09/20/19 0958  BP: (!) 156/81  Pulse: 95  Weight: 223 lb (101.2 kg)    Fetal Status: Fetal Heart Rate (bpm): 155   Movement: Present     General:  Alert, oriented and cooperative. Patient is in no acute distress.  Skin: Skin is warm and dry. No rash noted.   Cardiovascular: Normal heart rate noted  Respiratory: Normal respiratory effort, no problems with respiration noted  Abdomen: Soft, gravid, appropriate for gestational age.  Pain/Pressure: Absent     Pelvic: Cervical exam deferred        Extremities: Normal range of motion.  Edema: Trace  Mental Status: Normal mood and affect. Normal behavior. Normal judgment and thought content.   Assessment and Plan:  Pregnancy: G5P4004 at [redacted]w[redacted]d 1. Supervision of other normal pregnancy, antepartum FH and FHR WNL  2. ANXIETY DEPRESSION  3. HTN ante BP remains elevated. Will increase meds to 300mg  bid of Labetalol Pt has not started baby ASA  yet. She does have the meds at home. She reports that she will begin tem now.     Preterm labor symptoms and general obstetric precautions including but not limited to vaginal bleeding, contractions, leaking of fluid and fetal movement were reviewed in detail with the patient. Please refer to After Visit Summary for other counseling recommendations.   Return in about 4 weeks (around 10/18/2019) for in person.  Future Appointments  Date Time Provider Department Center  10/09/2019  3:30 PM [facility] NURSE [facility]  10/09/2019  3:30 PM [facility] US3 [facility]  01/29/2020  9:20 AM 01/31/2020, MD [facility]    Bradd Canary, MD

## 2019-10-09 ENCOUNTER — Ambulatory Visit: Payer: 59 | Admitting: *Deleted

## 2019-10-09 ENCOUNTER — Other Ambulatory Visit: Payer: Self-pay

## 2019-10-09 ENCOUNTER — Ambulatory Visit: Payer: 59 | Attending: Obstetrics and Gynecology

## 2019-10-09 DIAGNOSIS — Z3480 Encounter for supervision of other normal pregnancy, unspecified trimester: Secondary | ICD-10-CM | POA: Insufficient documentation

## 2019-10-09 DIAGNOSIS — E669 Obesity, unspecified: Secondary | ICD-10-CM

## 2019-10-09 DIAGNOSIS — O10919 Unspecified pre-existing hypertension complicating pregnancy, unspecified trimester: Secondary | ICD-10-CM | POA: Diagnosis present

## 2019-10-09 DIAGNOSIS — Z3A27 27 weeks gestation of pregnancy: Secondary | ICD-10-CM

## 2019-10-09 DIAGNOSIS — Z362 Encounter for other antenatal screening follow-up: Secondary | ICD-10-CM | POA: Diagnosis not present

## 2019-10-09 DIAGNOSIS — O10012 Pre-existing essential hypertension complicating pregnancy, second trimester: Secondary | ICD-10-CM | POA: Diagnosis not present

## 2019-10-09 DIAGNOSIS — O99212 Obesity complicating pregnancy, second trimester: Secondary | ICD-10-CM | POA: Diagnosis not present

## 2019-10-10 ENCOUNTER — Other Ambulatory Visit: Payer: Self-pay | Admitting: *Deleted

## 2019-10-10 DIAGNOSIS — O10913 Unspecified pre-existing hypertension complicating pregnancy, third trimester: Secondary | ICD-10-CM

## 2019-10-19 ENCOUNTER — Ambulatory Visit (INDEPENDENT_AMBULATORY_CARE_PROVIDER_SITE_OTHER): Payer: 59 | Admitting: Family Medicine

## 2019-10-19 ENCOUNTER — Other Ambulatory Visit: Payer: Self-pay

## 2019-10-19 VITALS — BP 135/80 | HR 90 | Wt 223.0 lb

## 2019-10-19 DIAGNOSIS — O26893 Other specified pregnancy related conditions, third trimester: Secondary | ICD-10-CM

## 2019-10-19 DIAGNOSIS — O99513 Diseases of the respiratory system complicating pregnancy, third trimester: Secondary | ICD-10-CM

## 2019-10-19 DIAGNOSIS — J4520 Mild intermittent asthma, uncomplicated: Secondary | ICD-10-CM

## 2019-10-19 DIAGNOSIS — Z3480 Encounter for supervision of other normal pregnancy, unspecified trimester: Secondary | ICD-10-CM

## 2019-10-19 DIAGNOSIS — O10913 Unspecified pre-existing hypertension complicating pregnancy, third trimester: Secondary | ICD-10-CM

## 2019-10-19 DIAGNOSIS — Z3A28 28 weeks gestation of pregnancy: Secondary | ICD-10-CM

## 2019-10-19 DIAGNOSIS — O10919 Unspecified pre-existing hypertension complicating pregnancy, unspecified trimester: Secondary | ICD-10-CM

## 2019-10-19 DIAGNOSIS — G5600 Carpal tunnel syndrome, unspecified upper limb: Secondary | ICD-10-CM

## 2019-10-19 DIAGNOSIS — O26899 Other specified pregnancy related conditions, unspecified trimester: Secondary | ICD-10-CM

## 2019-10-19 NOTE — Progress Notes (Signed)
   PRENATAL VISIT NOTE  Subjective:  Megan Hunt is a 34 y.o. [id number] at 17w3dbeing seen today for ongoing prenatal care.  She is currently monitored for the following issues for this high-risk pregnancy and has HYPERCHOLESTEROLEMIA; ANXIETY DEPRESSION; Essential hypertension antepartum; Allergic rhinitis; Asthma, mild intermittent; Constipation; Backache; SOMATIC DYSFUNCTION; CHEST PAIN, ATYPICAL; Migraine; Hemorrhoids, external; Fibromyalgia; Fatigue; Insomnia; Depression affecting pregnancy, antepartum; Preventative health care; Anemia; Sinusitis; and Supervision of other normal pregnancy, antepartum on their problem list.  Patient reports no complaints.  Contractions: Irritability. Vag. Bleeding: None.  Movement: Present. Denies leaking of fluid.   The following portions of the patient's history were reviewed and updated as appropriate: allergies, current medications, past family history, past medical history, past social history, past surgical history and problem list.   Objective:   Vitals:   10/19/19 0925  BP: 135/80  Pulse: 90  Weight: 223 lb (101.2 kg)    Fetal Status: Fetal Heart Rate (bpm): 149 Fundal Height: 31 cm Movement: Present     General:  Alert, oriented and cooperative. Patient is in no acute distress.  Skin: Skin is warm and dry. No rash noted.   Cardiovascular: Normal heart rate noted  Respiratory: Normal respiratory effort, no problems with respiration noted  Abdomen: Soft, gravid, appropriate for gestational age.  Pain/Pressure: Present     Pelvic: Cervical exam deferred        Extremities: Normal range of motion.  Edema: Trace  Mental Status: Normal mood and affect. Normal behavior. Normal judgment and thought content.   Assessment and Plan:  Pregnancy: [id number] 279w3d. Supervision of other normal pregnancy, antepartum FHT normal. FH > dates - CBC - Glucose Tolerance, 2 Hours w/1 Hour - HIV Antibody (routine testing w rflx) - RPR - Comp Met  (CMET)  2. Chronic hypertension during pregnancy, antepartum On asa 8129m EFW 92% - Comp Met (CMET)  3. Mild intermittent asthma without complication  4. Carpal tunnel syndrome during pregnancy   Preterm labor symptoms and general obstetric precautions including but not limited to vaginal bleeding, contractions, leaking of fluid and fetal movement were reviewed in detail with the patient. Please refer to After Visit Summary for other counseling recommendations.   Return in about 4 weeks (around 11/16/2019) for OB f/u, In Office.  Future Appointments  Date Time Provider DepDickson/29/2021  2:[facility]  2:30 PM [facility] US[facility]/21/2021  9:20 AM BlyMosie LukesD [facility]

## 2019-10-20 LAB — CBC
Hematocrit: 33.1 % — ABNORMAL LOW (ref 34.0–46.6)
Hemoglobin: 10.9 g/dL — ABNORMAL LOW (ref 11.1–15.9)
MCH: 27.6 pg (ref 26.6–33.0)
MCHC: 32.9 g/dL (ref 31.5–35.7)
MCV: 84 fL (ref 79–97)
Platelets: 227 10*3/uL (ref 150–450)
RBC: 3.95 x10E6/uL (ref 3.77–5.28)
RDW: 13.1 % (ref 11.7–15.4)
WBC: 11.5 10*3/uL — ABNORMAL HIGH (ref 3.4–10.8)

## 2019-10-20 LAB — GLUCOSE TOLERANCE, 2 HOURS W/ 1HR
Glucose, 1 hour: 139 mg/dL (ref 65–179)
Glucose, 2 hour: 101 mg/dL (ref 65–152)
Glucose, Fasting: 89 mg/dL (ref 65–91)

## 2019-10-20 LAB — RPR: RPR Ser Ql: NONREACTIVE

## 2019-10-20 LAB — HIV ANTIBODY (ROUTINE TESTING W REFLEX): HIV Screen 4th Generation wRfx: NONREACTIVE

## 2019-10-23 MED ORDER — FERROUS GLUCONATE 324 (38 FE) MG PO TABS
324.0000 mg | ORAL_TABLET | Freq: Every day | ORAL | 3 refills | Status: DC
Start: 2019-10-23 — End: 2019-12-06

## 2019-10-23 NOTE — Addendum Note (Signed)
Addended by: Levie Heritage on: 10/23/2019 01:32 PM   Modules accepted: Orders

## 2019-11-05 ENCOUNTER — Encounter (HOSPITAL_COMMUNITY): Payer: Self-pay | Admitting: Obstetrics and Gynecology

## 2019-11-05 ENCOUNTER — Inpatient Hospital Stay (HOSPITAL_COMMUNITY)
Admission: AD | Admit: 2019-11-05 | Discharge: 2019-11-06 | Disposition: A | Discharge status: Home / Self Care | Payer: 59 | Attending: Obstetrics and Gynecology | Admitting: Obstetrics and Gynecology

## 2019-11-05 DIAGNOSIS — Z823 Family history of stroke: Secondary | ICD-10-CM | POA: Insufficient documentation

## 2019-11-05 DIAGNOSIS — O2243 Hemorrhoids in pregnancy, third trimester: Secondary | ICD-10-CM | POA: Insufficient documentation

## 2019-11-05 DIAGNOSIS — O10919 Unspecified pre-existing hypertension complicating pregnancy, unspecified trimester: Secondary | ICD-10-CM

## 2019-11-05 DIAGNOSIS — K649 Unspecified hemorrhoids: Secondary | ICD-10-CM

## 2019-11-05 DIAGNOSIS — O10913 Unspecified pre-existing hypertension complicating pregnancy, third trimester: Secondary | ICD-10-CM | POA: Insufficient documentation

## 2019-11-05 DIAGNOSIS — O4690 Antepartum hemorrhage, unspecified, unspecified trimester: Secondary | ICD-10-CM

## 2019-11-05 DIAGNOSIS — Z3A31 31 weeks gestation of pregnancy: Secondary | ICD-10-CM

## 2019-11-05 DIAGNOSIS — Z8261 Family history of arthritis: Secondary | ICD-10-CM | POA: Insufficient documentation

## 2019-11-05 DIAGNOSIS — Z7982 Long term (current) use of aspirin: Secondary | ICD-10-CM | POA: Insufficient documentation

## 2019-11-05 DIAGNOSIS — Z6790 Unspecified blood type, Rh positive: Secondary | ICD-10-CM

## 2019-11-05 DIAGNOSIS — Z3689 Encounter for other specified antenatal screening: Secondary | ICD-10-CM

## 2019-11-05 DIAGNOSIS — O4693 Antepartum hemorrhage, unspecified, third trimester: Secondary | ICD-10-CM | POA: Insufficient documentation

## 2019-11-05 DIAGNOSIS — Z8249 Family history of ischemic heart disease and other diseases of the circulatory system: Secondary | ICD-10-CM | POA: Insufficient documentation

## 2019-11-05 DIAGNOSIS — Z79899 Other long term (current) drug therapy: Secondary | ICD-10-CM | POA: Insufficient documentation

## 2019-11-05 NOTE — MAU Note (Signed)
Pt reports to MAU stating she just got out of the shower and was drying off when she noticed blood on the towel. Pt states she came here and has not put on a pad. Pt did not notice the blood when she was wiping in MAU bathroom. Pt reports +FM. No LOF. Pt reports some mild abdominal cramping and state she feels like she did when she had her PPH. Pt states she feels a lot of pressure in her bottom.

## 2019-11-06 ENCOUNTER — Encounter: Payer: Self-pay | Admitting: *Deleted

## 2019-11-06 ENCOUNTER — Ambulatory Visit: Payer: 59 | Admitting: *Deleted

## 2019-11-06 ENCOUNTER — Ambulatory Visit (HOSPITAL_BASED_OUTPATIENT_CLINIC_OR_DEPARTMENT_OTHER): Payer: 59

## 2019-11-06 ENCOUNTER — Other Ambulatory Visit: Payer: Self-pay

## 2019-11-06 ENCOUNTER — Inpatient Hospital Stay (HOSPITAL_BASED_OUTPATIENT_CLINIC_OR_DEPARTMENT_OTHER): Payer: 59

## 2019-11-06 DIAGNOSIS — O10013 Pre-existing essential hypertension complicating pregnancy, third trimester: Secondary | ICD-10-CM | POA: Diagnosis not present

## 2019-11-06 DIAGNOSIS — Z6790 Unspecified blood type, Rh positive: Secondary | ICD-10-CM | POA: Diagnosis not present

## 2019-11-06 DIAGNOSIS — Z79899 Other long term (current) drug therapy: Secondary | ICD-10-CM | POA: Diagnosis not present

## 2019-11-06 DIAGNOSIS — Z3480 Encounter for supervision of other normal pregnancy, unspecified trimester: Secondary | ICD-10-CM | POA: Insufficient documentation

## 2019-11-06 DIAGNOSIS — Z3A31 31 weeks gestation of pregnancy: Secondary | ICD-10-CM

## 2019-11-06 DIAGNOSIS — O2243 Hemorrhoids in pregnancy, third trimester: Secondary | ICD-10-CM | POA: Diagnosis not present

## 2019-11-06 DIAGNOSIS — O4693 Antepartum hemorrhage, unspecified, third trimester: Secondary | ICD-10-CM

## 2019-11-06 DIAGNOSIS — Z8249 Family history of ischemic heart disease and other diseases of the circulatory system: Secondary | ICD-10-CM | POA: Diagnosis not present

## 2019-11-06 DIAGNOSIS — O99213 Obesity complicating pregnancy, third trimester: Secondary | ICD-10-CM

## 2019-11-06 DIAGNOSIS — O99212 Obesity complicating pregnancy, second trimester: Secondary | ICD-10-CM | POA: Diagnosis not present

## 2019-11-06 DIAGNOSIS — Z823 Family history of stroke: Secondary | ICD-10-CM | POA: Diagnosis not present

## 2019-11-06 DIAGNOSIS — E669 Obesity, unspecified: Secondary | ICD-10-CM | POA: Diagnosis not present

## 2019-11-06 DIAGNOSIS — O10913 Unspecified pre-existing hypertension complicating pregnancy, third trimester: Secondary | ICD-10-CM | POA: Insufficient documentation

## 2019-11-06 DIAGNOSIS — N939 Abnormal uterine and vaginal bleeding, unspecified: Secondary | ICD-10-CM | POA: Diagnosis present

## 2019-11-06 DIAGNOSIS — Z7982 Long term (current) use of aspirin: Secondary | ICD-10-CM | POA: Diagnosis not present

## 2019-11-06 DIAGNOSIS — Z8261 Family history of arthritis: Secondary | ICD-10-CM | POA: Diagnosis not present

## 2019-11-06 LAB — URINALYSIS, ROUTINE W REFLEX MICROSCOPIC
Bilirubin Urine: NEGATIVE
Glucose, UA: NEGATIVE mg/dL
Hgb urine dipstick: NEGATIVE
Ketones, ur: NEGATIVE mg/dL
Leukocytes,Ua: NEGATIVE
Nitrite: NEGATIVE
Protein, ur: NEGATIVE mg/dL
Specific Gravity, Urine: 1.014 (ref 1.005–1.030)
pH: 6 (ref 5.0–8.0)

## 2019-11-06 LAB — WET PREP, GENITAL
Clue Cells Wet Prep HPF POC: NONE SEEN
Sperm: NONE SEEN
Trich, Wet Prep: NONE SEEN
Yeast Wet Prep HPF POC: NONE SEEN

## 2019-11-06 LAB — GC/CHLAMYDIA PROBE AMP (~~LOC~~) NOT AT ARMC
Chlamydia: NEGATIVE
Comment: NEGATIVE
Comment: NORMAL
Neisseria Gonorrhea: NEGATIVE

## 2019-11-06 LAB — COMPREHENSIVE METABOLIC PANEL
ALT: 14 U/L (ref 0–44)
AST: 14 U/L — ABNORMAL LOW (ref 15–41)
Albumin: 2.9 g/dL — ABNORMAL LOW (ref 3.5–5.0)
Alkaline Phosphatase: 142 U/L — ABNORMAL HIGH (ref 38–126)
Anion gap: 10 (ref 5–15)
BUN: 7 mg/dL (ref 6–20)
CO2: 20 mmol/L — ABNORMAL LOW (ref 22–32)
Calcium: 8.9 mg/dL (ref 8.9–10.3)
Chloride: 105 mmol/L (ref 98–111)
Creatinine, Ser: 0.62 mg/dL (ref 0.44–1.00)
GFR calc Af Amer: >60 mL/min (ref >60)
GFR calc non Af Amer: >60 mL/min (ref >60)
Glucose, Bld: 109 mg/dL — ABNORMAL HIGH (ref 70–99)
Potassium: 3.8 mmol/L (ref 3.5–5.1)
Sodium: 135 mmol/L (ref 135–145)
Total Bilirubin: 0.3 mg/dL (ref 0.3–1.2)
Total Protein: 6.3 g/dL — ABNORMAL LOW (ref 6.5–8.1)

## 2019-11-06 LAB — CBC
HCT: 33.7 % — ABNORMAL LOW (ref 36.0–46.0)
Hemoglobin: 11 g/dL — ABNORMAL LOW (ref 12.0–15.0)
MCH: 27.6 pg (ref 26.0–34.0)
MCHC: 32.6 g/dL (ref 30.0–36.0)
MCV: 84.7 fL (ref 80.0–100.0)
Platelets: 249 10*3/uL (ref 150–400)
RBC: 3.98 MIL/uL (ref 3.87–5.11)
RDW: 13 % (ref 11.5–15.5)
WBC: 12.6 10*3/uL — ABNORMAL HIGH (ref 4.0–10.5)
nRBC: 0 % (ref 0.0–0.2)

## 2019-11-06 LAB — FETAL FIBRONECTIN: Fetal Fibronectin: NEGATIVE

## 2019-11-06 LAB — PROTEIN / CREATININE RATIO, URINE
Creatinine, Urine: 123.39 mg/dL
Protein Creatinine Ratio: 0.08 mg/mg{Cre} (ref 0.00–0.15)
Total Protein, Urine: 10 mg/dL

## 2019-11-06 NOTE — MAU Provider Note (Signed)
History     CSN: [id number]  Arrival date and time: 11/05/19 2336   First Provider Initiated Contact with Patient 11/06/19 0049      Chief Complaint  Patient presents with  . Vaginal Bleeding   [name] is a 34 y.o. [id number] at [redacted]w[redacted]d who presents to MAU for vaginal bleeding which began yesterday around 10PM. Patient reports she took a shower and as she was drying herself off she saw blood on the towel. Patient describes bleeding as a streak of red blood on the towel. Patient denies having to wear a pad since that time and denies seeing any bleeding while using restroom in MAU. Patient reports last intercourse over one week ago.  Passing blood clots? no Blood soaking clothes? no Lightheaded/dizzy? no Significant pelvic pain or cramping/ctx? no Passed any tissue? no Recent trauma? no Prior abruption? no Smoking/drug use? no HTN? CHTN, on labetalol  BID PPROM? no Current pregnancy problems? CHTN, hx of PPH Hx of C/S or GYN surgery? No  Blood Type? A Positive Allergies? NKDA Current medications? Labetalol  BID, bASA Current [facility] & next appt? [facility], 11/16/2019   OB History    Gravida  5   Para  4   Term  4   Preterm      AB      Living  4     SAB      TAB      Ectopic      Multiple  0   Live Births  4           Past Medical History:  Diagnosis Date  . Allergic rhinitis   . Allergy   . Anemia   . Anxiety   . Anxiety and depression   . Asthma   . Asthma, mild intermittent 10/27/2008   Qualifier: Diagnosis of  By: Kriste Basque MD, Lonzo Cloud   . Atypical chest pain   . Back pain   . Constipation   . History of migraines   . Hypercholesteremia   . Hypertension    no longer taking meds  . Insomnia 05/15/2016  . Somatic dysfunction     Past Surgical History:  Procedure Laterality Date  . WISDOM TOOTH EXTRACTION      Family History  Problem Relation Age of Onset  . Hypertension Father   . Hypertension Mother   . Arthritis Brother    . Stroke Maternal Grandfather   . Hypertension Maternal Grandfather   . Cancer Sister   . Birth defects Paternal Aunt     Social History   Tobacco Use  . Smoking status: Never Smoker  . Smokeless tobacco: Never Used  Vaping Use  . Vaping Use: Never used  Substance Use Topics  . Alcohol use: No  . Drug use: No    Allergies: No Known Allergies  Medications Prior to Admission  Medication Sig Dispense Refill Last Dose  . ASPIRIN 81 PO Take by mouth.     . Doxylamine-Pyridoxine (DICLEGIS) 10-10 MG TBEC Take 2 tablets by mouth at bedtime as needed. (Patient not taking: Reported on 07/10/2019) 100 tablet 1   . ferrous gluconate (FERGON) 324 MG tablet Take 1 tablet (324 mg total) by mouth daily with breakfast. 90 tablet 3   . labetalol (NORMODYNE) 300 MG tablet Take 1 tablet (300 mg total) by mouth 2 (two) times daily. 60 tablet 2     Review of Systems  Constitutional: Negative for chills, diaphoresis, fatigue and fever.  Eyes:  Negative for visual disturbance.  Respiratory: Negative for shortness of breath.   Cardiovascular: Negative for chest pain.  Gastrointestinal: Negative for abdominal pain, constipation, diarrhea, nausea and vomiting.  Genitourinary: Positive for vaginal bleeding. Negative for dysuria, flank pain, frequency, pelvic pain, urgency and vaginal discharge.  Neurological: Negative for dizziness, weakness, light-headedness and headaches.   Physical Exam   Blood pressure (!) 146/84, pulse 81, temperature 98.3 F (36.8 C), temperature source Oral, resp. rate 19, last menstrual period 04/01/2019, currently breastfeeding.  Patient Vitals for the past 24 hrs:  BP Temp Temp src Pulse Resp  11/06/19 0317 (!) 146/84 -- -- 81 --  11/06/19 0147 127/73 -- -- 83 --  11/06/19 0115 131/75 -- -- 83 --  11/06/19 0114 132/74 -- -- 79 --  11/06/19 0045 132/80 -- -- 87 --  11/06/19 0030 126/80 -- -- 88 --  11/06/19 0016 134/83 -- -- 90 --  11/06/19 0002 136/76 -- -- 93 --   11/05/19 2348 (!) 147/89 98.3 F (36.8 C) Oral 94 19   Physical Exam Exam conducted with a chaperone present.  Constitutional:      General: She is not in acute distress.    Appearance: She is well-developed. She is not diaphoretic.  HENT:     Head: Normocephalic and atraumatic.  Pulmonary:     Effort: Pulmonary effort is normal.  Abdominal:     General: There is no distension.     Palpations: Abdomen is soft. There is no mass.     Tenderness: There is no abdominal tenderness. There is no guarding or rebound.  Genitourinary:    General: Normal vulva.     Labia:        Right: No rash, tenderness or lesion.        Left: No rash, tenderness or lesion.      Vagina: No vaginal discharge.     Cervix: No cervical motion tenderness, discharge, friability, lesion, erythema or cervical bleeding.       Comments: No evidence of bleeding inside vagina, normal, scant, white creamy discharge present without reddish/brown discoloration. CE: long/closed/posterior Skin:    General: Skin is warm and dry.  Neurological:     Mental Status: She is alert and oriented to person, place, and time.  Psychiatric:        Behavior: Behavior normal.        Thought Content: Thought content normal.        Judgment: Judgment normal.    Results for orders placed or performed during the hospital encounter of 11/05/19 (from the past 24 hour(s))  Urinalysis, Routine w reflex microscopic     Status: None   Collection Time: 11/06/19 12:08 AM  Result Value Ref Range   Color, Urine YELLOW YELLOW   APPearance CLEAR CLEAR   Specific Gravity, Urine 1.014 1.005 - 1.030   pH 6.0 5.0 - 8.0   Glucose, UA NEGATIVE NEGATIVE mg/dL   Hgb urine dipstick NEGATIVE NEGATIVE   Bilirubin Urine NEGATIVE NEGATIVE   Ketones, ur NEGATIVE NEGATIVE mg/dL   Protein, ur NEGATIVE NEGATIVE mg/dL   Nitrite NEGATIVE NEGATIVE   Leukocytes,Ua NEGATIVE NEGATIVE  Protein / creatinine ratio, urine     Status: None   Collection Time:  11/06/19 12:08 AM  Result Value Ref Range   Creatinine, Urine 123.39 mg/dL   Total Protein, Urine 10 mg/dL   Protein Creatinine Ratio 0.08 0.00 - 0.15 mg/mg[Cre]  CBC     Status: Abnormal   Collection Time: 11/06/19 12:36  AM  Result Value Ref Range   WBC 12.6 (H) 4.0 - 10.5 K/uL   RBC 3.98 3.87 - 5.11 MIL/uL   Hemoglobin 11.0 (L) 12.0 - 15.0 g/dL   HCT 45.8 (L) 36 - 46 %   MCV 84.7 80.0 - 100.0 fL   MCH 27.6 26.0 - 34.0 pg   MCHC 32.6 30.0 - 36.0 g/dL   RDW 09.9 83.3 - 82.5 %   Platelets 249 150 - 400 K/uL   nRBC 0.0 0.0 - 0.2 %  Comprehensive metabolic panel     Status: Abnormal   Collection Time: 11/06/19 12:36 AM  Result Value Ref Range   Sodium 135 135 - 145 mmol/L   Potassium 3.8 3.5 - 5.1 mmol/L   Chloride 105 98 - 111 mmol/L   CO2 20 (L) 22 - 32 mmol/L   Glucose, Bld 109 (H) 70 - 99 mg/dL   BUN 7 6 - 20 mg/dL   Creatinine, Ser 0.53 0.44 - 1.00 mg/dL   Calcium 8.9 8.9 - 97.6 mg/dL   Total Protein 6.3 (L) 6.5 - 8.1 g/dL   Albumin 2.9 (L) 3.5 - 5.0 g/dL   AST 14 (L) 15 - 41 U/L   ALT 14 0 - 44 U/L   Alkaline Phosphatase 142 (H) 38 - 126 U/L   Total Bilirubin 0.3 0.3 - 1.2 mg/dL   GFR calc non Af Amer >60 >60 mL/min   GFR calc Af Amer >60 >60 mL/min   Anion gap 10 5 - 15  Fetal fibronectin     Status: None   Collection Time: 11/06/19  1:12 AM  Result Value Ref Range   Fetal Fibronectin NEGATIVE NEGATIVE  Wet prep, genital     Status: Abnormal   Collection Time: 11/06/19  1:12 AM  Result Value Ref Range   Yeast Wet Prep HPF POC NONE SEEN NONE SEEN   Trich, Wet Prep NONE SEEN NONE SEEN   Clue Cells Wet Prep HPF POC NONE SEEN NONE SEEN   WBC, Wet Prep HPF POC MANY (A) NONE SEEN   Sperm NONE SEEN    [place] [facility] OB FOLLOW UP  Result Date: 10/09/2019 ----------------------------------------------------------------------  OBSTETRICS REPORT                       (Signed Final 10/09/2019 04:23 pm) ---------------------------------------------------------------------- Patient  Info  ID #:       [id number]                          D.O.B.:  Feb 02, 1986 (33 yrs)  Name:       Megan Hunt                  Visit Date: 10/09/2019 03:28 pm ---------------------------------------------------------------------- Performed By  Attending:        Ma Rings MD         Ref. Address:     [address]                                                             [address]  Performed By:     Lenise Arena        [facility]  Referred By:      [facility] [place] ---------------------------------------------------------------------- Orders  #  Description                           Code        Ordered By  1  [place] MFM OB FOLLOW UP                   [phone]    Noralee Space ----------------------------------------------------------------------  #  Order #                     Accession #                Episode #  1  [id number]                   [id number]                 [id number] ---------------------------------------------------------------------- Indications  Hypertension - Chronic/Pre-existing            O10.019  (labetalol)  Encounter for other antenatal screening        Z36.2  follow-up  Obesity complicating pregnancy, second         O99.212  trimester  [redacted] weeks gestation of pregnancy                Z3A.27 ---------------------------------------------------------------------- Fetal Evaluation  Num Of Fetuses:         1  Fetal Heart Rate(bpm):  148  Cardiac Activity:       Observed  Presentation:           Cephalic  Placenta:               Posterior  P. Cord Insertion:      Previously Visualized  Amniotic Fluid  AFI FV:      Within normal limits ---------------------------------------------------------------------- Biometry  BPD:      65.3  mm     G. Age:  26w 3d         19  %    CI:        69.85   %    70 - 86                                                          FL/HC:      21.3   %    18.6 - 20.4  HC:      249.3   mm     G. Age:  27w 0d         24  %    HC/AC:      0.99        1.05 - 1.21  AC:      250.9  mm     G. Age:  29w 2d         95  %  FL/BPD:     81.5   %    71 - 87  FL:       53.2  mm     G. Age:  28w 2d         73  %    FL/AC:      21.2   %    20 - 24  LV:        1.4  mm  Est. FW:    1240  gm    2 lb 12 oz      92  % ---------------------------------------------------------------------- OB History  Gravidity:    5         Term:   4        Prem:   0        SAB:   0  TOP:          0       Ectopic:  0        Living: 4 ---------------------------------------------------------------------- Gestational Age  LMP:           27w 2d        Date:  04/01/19                 EDD:   01/06/20  U/S Today:     27w 5d                                        EDD:   01/03/20  Best:          27w 0d     Det. ByMarcella Dubs         EDD:   01/08/20                                      (06/05/19) ---------------------------------------------------------------------- Anatomy  Cranium:               Appears normal         Aortic Arch:            Previously seen  Cavum:                 Appears normal         Ductal Arch:            Previously seen  Ventricles:            Appears normal         Diaphragm:              Previously seen  Choroid Plexus:        Previously seen        Stomach:                Appears normal, left                                                                        sided  Cerebellum:            Appears normal  Abdomen:                Appears normal  Posterior Fossa:       Previously seen        Abdominal Wall:         Previously seen  Nuchal Fold:           Previously seen        Cord Vessels:           Previously seen  Face:                  Orbits and profile     Kidneys:                Appear normal                         previously seen  Lips:                  Previously seen        Bladder:                Appears normal  Thoracic:              Appears normal         Spine:                   Previously seen  Heart:                 Previously seen        Upper Extremities:      Previously seen  RVOT:                  Previously seen        Lower Extremities:      Previously seen  LVOT:                  Previously seen  Other:  Heels/feet, open hands/5th digits, 3VV, 3VTV, and Nasal bone          previously visualized. ---------------------------------------------------------------------- Cervix Uterus Adnexa  Cervix  Not visualized (advanced GA >24wks) ---------------------------------------------------------------------- Comments  This patient was seen for a follow up growth scan due to  chronic hypertension currently treated with labetalol.  She  denies any problems since her last exam.  She was informed that the fetal growth measures at the 92nd  percentile for her gestational age.  There was normal  amniotic fluid noted today.  A follow up exam was scheduled in 4 weeks.  Due to chronic hypertension that is treated with labetalol, we  will start weekly fetal testing at around 32 weeks. ----------------------------------------------------------------------                   Ma Rings, MD Electronically Signed Final Report   10/09/2019 04:23 pm ----------------------------------------------------------------------   MAU Course  Procedures  MDM -VB in third trimester per pt report without evidence of bleeding on exam, large hemorrhoid present -risk factor for abruption of cHTN, single elevated pressure in MAU on admission and again on discharge, preeclampsia labs drawn, pt asymptomatic -RH positive -UA: WNL -CBC: WNL for pregnancy -CMP: WNL for pregnancy -PCr: 0.08 -fFN: negative -WetPrep: WNL -GC/CT collected -CE: long/closed/posterior -EFM: reactive       -baseline: 140/130       -variability: moderate       -accels: present, 15x15       -decels: few  variables       -TOCO: quiet -US: subjectively low-normal fluid, fetal parts obscuring aspects of placenta limiting assessment  for abruption -consulted with Dr. Earlene Plateravis, pt OK to be discharged home and to keep appt for US with MFM this afternoon -pt discharged to home in stable condition   Orders Placed This Encounter  Procedures  . Wet prep, genital    Standing Status:   Standing    Number of Occurrences:   1  . US MFM OB LIMITED    Standing Status:   Standing    Number of Occurrences:   1    Order Specific Question:   Symptom/Reason for Exam    Answer:   Vaginal bleeding in pregnancy [[id number]]  . Urinalysis, Routine w reflex microscopic    Standing Status:   Standing    Number of Occurrences:   1  . Fetal fibronectin    Standing Status:   Standing    Number of Occurrences:   1  . CBC    Standing Status:   Standing    Number of Occurrences:   1  . Comprehensive metabolic panel    Standing Status:   Standing    Number of Occurrences:   1  . Protein / creatinine ratio, urine    Standing Status:   Standing    Number of Occurrences:   1  . Discharge patient    Order Specific Question:   Discharge disposition    Answer:   01-Home or Self Care [1]    Order Specific Question:   Discharge patient date    Answer:   11/06/2019   No orders of the defined types were placed in this encounter.   Assessment and Plan   1. Vaginal bleeding in pregnancy   2. Blood type, Rh positive   3. Hemorrhoids, unspecified hemorrhoid type   4. [redacted] weeks gestation of pregnancy   5. NST (non-stress test) reactive   6. Chronic hypertension in pregnancy     Allergies as of 11/06/2019   No Known Allergies     Medication List    TAKE these medications   ASPIRIN 81 PO Take by mouth.   Doxylamine-Pyridoxine 10-10 MG Tbec Commonly known as: Diclegis Take 2 tablets by mouth at bedtime as needed.   ferrous gluconate 324 MG tablet Commonly known as: FERGON Take 1 tablet (324 mg total) by mouth daily with breakfast.   labetalol 300 MG tablet Commonly known as: NORMODYNE Take 1 tablet (300 mg total) by mouth 2 (two)  times daily.      -will call with culture results, if positive -pt to keep appt with MFM today for US, pt informed we did not do the same type of US that MFM will perform later today -return MAU precautions given -pt discharged to home in stable condition  Megan Hunt 11/06/2019, 3:31 AM

## 2019-11-06 NOTE — Progress Notes (Signed)
Noticed a little bit of bleeding after 10pm last night, not assoc w/intercourse. Was seen in MAU, had limited U/S and was told everything was ok. No bleeding currently.

## 2019-11-06 NOTE — Discharge Instructions (Signed)
Vaginal Bleeding During Pregnancy, Third Trimester  A small amount of bleeding from the vagina (spotting) is relatively common during pregnancy. Various things can cause bleeding or spotting during pregnancy. Sometimes bleeding is normal and is not a problem. However, bleeding during the third trimester can also be a sign of something serious for the mother and the baby. Be sure to tell your health care provider about any vaginal bleeding right away. Some possible causes of vaginal bleeding during the third trimester include:  Infection or growths (polyps) on the cervix.  A condition in which the placenta partially or completely covers the opening of the cervix inside the uterus (placenta previa).  The placenta separating from the uterus (placenta abruption).  The start of labor (discharging of the mucus plug).  A condition in which the placenta grows into the muscle layer of the uterus (placenta accreta). Follow these instructions at home: Activity  Follow instructions from your health care provider about limiting your activity. If your health care provider recommends activity restriction, you may need to stay in bed and only get up to use the bathroom. In some cases, your health care provider may allow you to continue light activity.  If needed, make plans for someone to help with your regular activities.  Ask your health care provider if it is safe for you to drive.  Do not lift anything that is heavier than 10 lb (4.5 kg), or the limit that your health care provider tells you, until he or she says that it is safe.  Do not have sex or orgasms until your health care provider says that this is safe. Medicines  Take over-the-counter and prescription medicines only as told by your health care provider.  Do not take aspirin because it can cause bleeding. General instructions  Pay attention to any changes in your symptoms.  Write down how many pads you use each day, how often you  change pads, and how soaked (saturated) they are.  Do not use tampons or douche.  If you pass any tissue from your vagina, save the tissue so you can show it to your health care provider.  Keep all follow-up visits as told by your health care provider. This is important. Contact a health care provider if:  You have vaginal bleeding during any part of your pregnancy.  You have cramps or labor pains.  You have a fever. Get help right away if:  You have severe cramps or pain in your back or abdomen.  You have a gush of fluid from the vagina.  You pass large clots or a large amount of tissue from your vagina.  Your bleeding increases.  You feel light-headed or weak.  You faint.  You feel that your baby is moving less than usual, or not moving at all. Summary  Various things can cause bleeding or spotting in pregnancy.  Bleeding during the third trimester can be a sign of a serious problem for the mother and the baby.  Be sure to tell your health care provider about any vaginal bleeding right away. This information is not intended to replace advice given to you by your health care provider. Make sure you discuss any questions you have with your health care provider. Document Revised: 08/15/2018 Document Reviewed: 07/29/2016 Elsevier Patient Education  2020 Elsevier Inc.       Abdominal Pain During Pregnancy  Abdominal pain is common during pregnancy, and has many possible causes. Some causes are more serious than others, and sometimes  the cause is not known. Abdominal pain can be a sign that labor is starting. It can also be caused by normal growth and stretching of muscles and ligaments during pregnancy. Always tell your health care provider if you have any abdominal pain. Follow these instructions at home:  Do not have sex or put anything in your vagina until your pain goes away completely.  Get plenty of rest until your pain improves.  Drink enough fluid to keep  your urine pale yellow.  Take over-the-counter and prescription medicines only as told by your health care provider.  Keep all follow-up visits as told by your health care provider. This is important. Contact a health care provider if:  Your pain continues or gets worse after resting.  You have lower abdominal pain that: ? Comes and goes at regular intervals. ? Spreads to your back. ? Is similar to menstrual cramps.  You have pain or burning when you urinate. Get help right away if:  You have a fever or chills.  You have vaginal bleeding.  You are leaking fluid from your vagina.  You are passing tissue from your vagina.  You have vomiting or diarrhea that lasts for more than 24 hours.  Your baby is moving less than usual.  You feel very weak or faint.  You have shortness of breath.  You develop severe pain in your upper abdomen. Summary  Abdominal pain is common during pregnancy, and has many possible causes.  If you experience abdominal pain during pregnancy, tell your health care provider right away.  Follow your health care provider's home care instructions and keep all follow-up visits as directed. This information is not intended to replace advice given to you by your health care provider. Make sure you discuss any questions you have with your health care provider. Document Revised: 08/14/2018 Document Reviewed: 07/29/2016 Elsevier Patient Education  2020 [employer].        Preterm Labor and Birth Information  The normal length of a pregnancy is 39-41 weeks. Preterm labor is when labor starts before 37 completed weeks of pregnancy. What are the risk factors for preterm labor? Preterm labor is more likely to occur in women who:  Have certain infections during pregnancy such as a bladder infection, sexually transmitted infection, or infection inside the uterus (chorioamnionitis).  Have a shorter-than-normal cervix.  Have gone into preterm labor  before.  Have had surgery on their cervix.  Are younger than age 83 or older than age 32.  Are African American.  Are pregnant with twins or multiple babies (multiple gestation).  Take street drugs or smoke while pregnant.  Do not gain enough weight while pregnant.  Became pregnant shortly after having been pregnant. What are the symptoms of preterm labor? Symptoms of preterm labor include:  Cramps similar to those that can happen during a menstrual period. The cramps may happen with diarrhea.  Pain in the abdomen or lower back.  Regular uterine contractions that may feel like tightening of the abdomen.  A feeling of increased pressure in the pelvis.  Increased watery or bloody mucus discharge from the vagina.  Water breaking (ruptured amniotic sac). Why is it important to recognize signs of preterm labor? It is important to recognize signs of preterm labor because babies who are born prematurely may not be fully developed. This can put them at an increased risk for:  Long-term (chronic) heart and lung problems.  Difficulty immediately after birth with regulating body systems, including blood sugar, body  temperature, heart rate, and breathing rate.  Bleeding in the brain.  Cerebral palsy.  Learning difficulties.  Death. These risks are highest for babies who are born before 34 weeks of pregnancy. How is preterm labor treated? Treatment depends on the length of your pregnancy, your condition, and the health of your baby. It may involve:  Having a stitch (suture) placed in your cervix to prevent your cervix from opening too early (cerclage).  Taking or being given medicines, such as: ? Hormone medicines. These may be given early in pregnancy to help support the pregnancy. ? Medicine to stop contractions. ? Medicines to help mature the baby's lungs. These may be prescribed if the risk of delivery is high. ? Medicines to prevent your baby from developing cerebral  palsy. If the labor happens before 34 weeks of pregnancy, you may need to stay in the hospital. What should I do if I think I am in preterm labor? If you think that you are going into preterm labor, call your health care provider right away. How can I prevent preterm labor in future pregnancies? To increase your chance of having a full-term pregnancy:  Do not use any tobacco products, such as cigarettes, chewing tobacco, and e-cigarettes. If you need help quitting, ask your health care provider.  Do not use street drugs or medicines that have not been prescribed to you during your pregnancy.  Talk with your health care provider before taking any herbal supplements, even if you have been taking them regularly.  Make sure you gain a healthy amount of weight during your pregnancy.  Watch for infection. If you think that you might have an infection, get it checked right away.  Make sure to tell your health care provider if you have gone into preterm labor before. This information is not intended to replace advice given to you by your health care provider. Make sure you discuss any questions you have with your health care provider. Document Revised: 08/18/2018 Document Reviewed: 09/17/2015 Elsevier Patient Education  2020 [employer]        Hemorrhoids Hemorrhoids are swollen veins in and around the rectum or anus. There are two types of hemorrhoids:  Internal hemorrhoids. These occur in the veins that are just inside the rectum. They may poke through to the outside and become irritated and painful.  External hemorrhoids. These occur in the veins that are outside the anus and can be felt as a painful swelling or hard lump near the anus. Most hemorrhoids do not cause serious problems, and they can be managed with home treatments such as diet and lifestyle changes. If home treatments do not help the symptoms, procedures can be done to shrink or remove the hemorrhoids. What are the  causes? This condition is caused by increased pressure in the anal area. This pressure may result from various things, including:  Constipation.  Straining to have a bowel movement.  Diarrhea.  Pregnancy.  Obesity.  Sitting for long periods of time.  Heavy lifting or other activity that causes you to strain.  Anal sex.  Riding a bike for a long period of time. What are the signs or symptoms? Symptoms of this condition include:  Pain.  Anal itching or irritation.  Rectal bleeding.  Leakage of stool (feces).  Anal swelling.  One or more lumps around the anus. How is this diagnosed? This condition can often be diagnosed through a visual exam. Other exams or tests may also be done, such as:  An exam  that involves feeling the rectal area with a gloved hand (digital rectal exam).  An exam of the anal canal that is done using a small tube (anoscope).  A blood test, if you have lost a significant amount of blood.  A test to look inside the colon using a flexible tube with a camera on the end (sigmoidoscopy or colonoscopy). How is this treated? This condition can usually be treated at home. However, various procedures may be done if dietary changes, lifestyle changes, and other home treatments do not help your symptoms. These procedures can help make the hemorrhoids smaller or remove them completely. Some of these procedures involve surgery, and others do not. Common procedures include:  Rubber band ligation. Rubber bands are placed at the base of the hemorrhoids to cut off their blood supply.  Sclerotherapy. Medicine is injected into the hemorrhoids to shrink them.  Infrared coagulation. A type of light energy is used to get rid of the hemorrhoids.  Hemorrhoidectomy surgery. The hemorrhoids are surgically removed, and the veins that supply them are tied off.  Stapled hemorrhoidopexy surgery. The surgeon staples the base of the hemorrhoid to the rectal wall. Follow  these instructions at home: Eating and drinking   Eat foods that have a lot of fiber in them, such as whole grains, beans, nuts, fruits, and vegetables.  Ask your health care provider about taking products that have added fiber (fiber supplements).  Reduce the amount of fat in your diet. You can do this by eating low-fat dairy products, eating less red meat, and avoiding processed foods.  Drink enough fluid to keep your urine pale yellow. Managing pain and swelling   Take warm sitz baths for 20 minutes, 3-4 times a day to ease pain and discomfort. You may do this in a bathtub or using a portable sitz bath that fits over the toilet.  If directed, apply ice to the affected area. Using ice packs between sitz baths may be helpful. ? Put ice in a plastic bag. ? Place a towel between your skin and the bag. ? Leave the ice on for 20 minutes, 2-3 times a day. General instructions  Take over-the-counter and prescription medicines only as told by your health care provider.  Use medicated creams or suppositories as told.  Get regular exercise. Ask your health care provider how much and what kind of exercise is best for you. In general, you should do moderate exercise for at least 30 minutes on most days of the week (150 minutes each week). This can include activities such as walking, biking, or yoga.  Go to the bathroom when you have the urge to have a bowel movement. Do not wait.  Avoid straining to have bowel movements.  Keep the anal area dry and clean. Use wet toilet paper or moist towelettes after a bowel movement.  Do not sit on the toilet for long periods of time. This increases blood pooling and pain.  Keep all follow-up visits as told by your health care provider. This is important. Contact a health care provider if you have:  Increasing pain and swelling that are not controlled by treatment or medicine.  Difficulty having a bowel movement, or you are unable to have a bowel  movement.  Pain or inflammation outside the area of the hemorrhoids. Get help right away if you have:  Uncontrolled bleeding from your rectum. Summary  Hemorrhoids are swollen veins in and around the rectum or anus.  Most hemorrhoids can be managed with  home treatments such as diet and lifestyle changes.  Taking warm sitz baths can help ease pain and discomfort.  In severe cases, procedures or surgery can be done to shrink or remove the hemorrhoids. This information is not intended to replace advice given to you by your health care provider. Make sure you discuss any questions you have with your health care provider. Document Revised: 09/22/2018 Document Reviewed: 09/15/2017 Elsevier Patient Education  2020 [employer].                       Safe Medications in Pregnancy    Acne: Benzoyl Peroxide Salicylic Acid  Backache/Headache: Tylenol: 2 regular strength every 4 hours OR              2 Extra strength every 6 hours  Colds/Coughs/Allergies: Benadryl (alcohol free) 25 mg every 6 hours as needed Breath right strips Claritin Cepacol throat lozenges Chloraseptic throat spray Cold-Eeze- up to three times per day Cough drops, alcohol free Flonase (by prescription only) Guaifenesin Mucinex Robitussin DM (plain only, alcohol free) Saline nasal spray/drops Sudafed (pseudoephedrine) & Actifed ** use only after [redacted] weeks gestation and if you do not have high blood pressure Tylenol Vicks Vaporub Zinc lozenges Zyrtec   Constipation: Colace Ducolax suppositories Fleet enema Glycerin suppositories Metamucil Milk of magnesia Miralax Senokot Smooth move tea  Diarrhea: Kaopectate Imodium A-D  *NO pepto Bismol  Hemorrhoids: Anusol Anusol HC Preparation H Tucks  Indigestion: Tums Maalox Mylanta Zantac  Pepcid  Insomnia: Benadryl (alcohol free) 25mg  every 6 hours as needed Tylenol PM Unisom, no Gelcaps  Leg  Cramps: Tums MagGel  Nausea/Vomiting:  Bonine Dramamine Emetrol Ginger extract Sea bands Meclizine  Nausea medication to take during pregnancy:  Unisom (doxylamine succinate 25 mg tablets) Take one tablet daily at bedtime. If symptoms are not adequately controlled, the dose can be increased to a maximum recommended dose of two tablets daily (1/2 tablet in the morning, 1/2 tablet mid-afternoon and one at bedtime). Vitamin B6 100mg  tablets. Take one tablet twice a day (up to 200 mg per day).  Skin Rashes: Aveeno products Benadryl cream or 25mg  every 6 hours as needed Calamine Lotion 1% cortisone cream  Yeast infection: Gyne-lotrimin 7 Monistat 7   **If taking multiple medications, please check labels to avoid duplicating the same active ingredients **take medication as directed on the label ** Do not exceed 4000 mg of tylenol in 24 hours **Do not take medications that contain aspirin or ibuprofen

## 2019-11-07 ENCOUNTER — Other Ambulatory Visit: Payer: Self-pay | Admitting: *Deleted

## 2019-11-07 DIAGNOSIS — O10919 Unspecified pre-existing hypertension complicating pregnancy, unspecified trimester: Secondary | ICD-10-CM

## 2019-11-15 ENCOUNTER — Other Ambulatory Visit: Payer: Self-pay

## 2019-11-15 ENCOUNTER — Ambulatory Visit: Payer: 59 | Attending: Obstetrics and Gynecology

## 2019-11-15 ENCOUNTER — Ambulatory Visit: Payer: 59 | Admitting: *Deleted

## 2019-11-15 DIAGNOSIS — O10919 Unspecified pre-existing hypertension complicating pregnancy, unspecified trimester: Secondary | ICD-10-CM | POA: Insufficient documentation

## 2019-11-15 DIAGNOSIS — O99213 Obesity complicating pregnancy, third trimester: Secondary | ICD-10-CM | POA: Diagnosis not present

## 2019-11-15 DIAGNOSIS — Z3480 Encounter for supervision of other normal pregnancy, unspecified trimester: Secondary | ICD-10-CM | POA: Diagnosis present

## 2019-11-15 DIAGNOSIS — O10013 Pre-existing essential hypertension complicating pregnancy, third trimester: Secondary | ICD-10-CM

## 2019-11-15 DIAGNOSIS — Z3A32 32 weeks gestation of pregnancy: Secondary | ICD-10-CM | POA: Diagnosis not present

## 2019-11-15 DIAGNOSIS — O4693 Antepartum hemorrhage, unspecified, third trimester: Secondary | ICD-10-CM

## 2019-11-16 ENCOUNTER — Ambulatory Visit (INDEPENDENT_AMBULATORY_CARE_PROVIDER_SITE_OTHER): Payer: 59 | Admitting: Family Medicine

## 2019-11-16 VITALS — BP 136/88 | HR 93 | Wt 228.0 lb

## 2019-11-16 DIAGNOSIS — O10919 Unspecified pre-existing hypertension complicating pregnancy, unspecified trimester: Secondary | ICD-10-CM

## 2019-11-16 DIAGNOSIS — Z3480 Encounter for supervision of other normal pregnancy, unspecified trimester: Secondary | ICD-10-CM

## 2019-11-16 DIAGNOSIS — J4520 Mild intermittent asthma, uncomplicated: Secondary | ICD-10-CM

## 2019-11-16 DIAGNOSIS — O10913 Unspecified pre-existing hypertension complicating pregnancy, third trimester: Secondary | ICD-10-CM

## 2019-11-16 DIAGNOSIS — O99013 Anemia complicating pregnancy, third trimester: Secondary | ICD-10-CM

## 2019-11-16 DIAGNOSIS — Z3A32 32 weeks gestation of pregnancy: Secondary | ICD-10-CM

## 2019-11-16 DIAGNOSIS — D508 Other iron deficiency anemias: Secondary | ICD-10-CM

## 2019-11-16 DIAGNOSIS — O99513 Diseases of the respiratory system complicating pregnancy, third trimester: Secondary | ICD-10-CM

## 2019-11-16 NOTE — Progress Notes (Signed)
   PRENATAL VISIT NOTE  Subjective:  Megan Hunt is a 34 y.o. [id number] at [redacted]w[redacted]d being seen today for ongoing prenatal care.  She is currently monitored for the following issues for this high-risk pregnancy and has HYPERCHOLESTEROLEMIA; ANXIETY DEPRESSION; Essential hypertension antepartum; Allergic rhinitis; Asthma, mild intermittent; Constipation; Backache; SOMATIC DYSFUNCTION; CHEST PAIN, ATYPICAL; Migraine; Hemorrhoids, external; Fibromyalgia; Fatigue; Insomnia; Depression affecting pregnancy, antepartum; Preventative health care; Anemia; Sinusitis; and Supervision of other normal pregnancy, antepartum on their problem list.  Patient reports no complaints.  Contractions: Not present.  .  Movement: Present. Denies leaking of fluid.   The following portions of the patient's history were reviewed and updated as appropriate: allergies, current medications, past family history, past medical history, past social history, past surgical history and problem list.   Objective:   Vitals:   11/16/19 1005  BP: 136/88  Pulse: 93  Weight: 228 lb (103.4 kg)    Fetal Status: Fetal Heart Rate (bpm): 141 Fundal Height: 33 cm Movement: Present     General:  Alert, oriented and cooperative. Patient is in no acute distress.  Skin: Skin is warm and dry. No rash noted.   Cardiovascular: Normal heart rate noted  Respiratory: Normal respiratory effort, no problems with respiration noted  Abdomen: Soft, gravid, appropriate for gestational age.  Pain/Pressure: Present     Pelvic: Cervical exam deferred        Extremities: Normal range of motion.  Edema: Trace  Mental Status: Normal mood and affect. Normal behavior. Normal judgment and thought content.   Assessment and Plan:  Pregnancy: G5P4004 at [redacted]w[redacted]d 1. Supervision of other normal pregnancy, antepartum FHT and FH normal  2. Iron deficiency anemia secondary to inadequate dietary iron intake On iron  3. Chronic hypertension during pregnancy,  antepartum Controlled. Weekly BPP Growth 92%  4. Mild intermittent asthma without complication   Preterm labor symptoms and general obstetric precautions including but not limited to vaginal bleeding, contractions, leaking of fluid and fetal movement were reviewed in detail with the patient. Please refer to After Visit Summary for other counseling recommendations.   Return in about 2 weeks (around 11/30/2019) for OB f/u, In Office.  Future Appointments  Date Time Provider Department Center  11/20/2019  1:30 PM [facility] NURSE [facility]  11/20/2019  1:45 PM [facility] US[facility]  11/27/2019 11:10 AM Aviva Signs, CNM [facility] None  11/27/2019  1:30 PM [facility] NURSE [facility]  11/27/2019  1:45 PM [facility] US4 [facility]  12/04/2019  1:30 PM [facility] NURSE [facility]  12/04/2019  1:45 PM [facility] US4 [facility]  12/11/2019 10:45 AM Aviva Signs, CNM [facility] None  01/29/2020  9:20 AM Bradd Canary, MD [facility]    Levie Heritage, DO

## 2019-11-20 ENCOUNTER — Other Ambulatory Visit: Payer: Self-pay

## 2019-11-20 ENCOUNTER — Encounter: Payer: Self-pay | Admitting: *Deleted

## 2019-11-20 ENCOUNTER — Ambulatory Visit: Payer: 59 | Attending: Obstetrics and Gynecology

## 2019-11-20 ENCOUNTER — Ambulatory Visit: Payer: 59 | Admitting: *Deleted

## 2019-11-20 DIAGNOSIS — O4693 Antepartum hemorrhage, unspecified, third trimester: Secondary | ICD-10-CM

## 2019-11-20 DIAGNOSIS — O99213 Obesity complicating pregnancy, third trimester: Secondary | ICD-10-CM | POA: Diagnosis not present

## 2019-11-20 DIAGNOSIS — Z362 Encounter for other antenatal screening follow-up: Secondary | ICD-10-CM

## 2019-11-20 DIAGNOSIS — O10919 Unspecified pre-existing hypertension complicating pregnancy, unspecified trimester: Secondary | ICD-10-CM | POA: Insufficient documentation

## 2019-11-20 DIAGNOSIS — Z3480 Encounter for supervision of other normal pregnancy, unspecified trimester: Secondary | ICD-10-CM | POA: Insufficient documentation

## 2019-11-20 DIAGNOSIS — O10013 Pre-existing essential hypertension complicating pregnancy, third trimester: Secondary | ICD-10-CM

## 2019-11-20 DIAGNOSIS — Z3A33 33 weeks gestation of pregnancy: Secondary | ICD-10-CM

## 2019-11-20 DIAGNOSIS — E669 Obesity, unspecified: Secondary | ICD-10-CM

## 2019-11-21 ENCOUNTER — Other Ambulatory Visit: Payer: Self-pay

## 2019-11-21 DIAGNOSIS — O10019 Pre-existing essential hypertension complicating pregnancy, unspecified trimester: Secondary | ICD-10-CM

## 2019-11-21 MED ORDER — LABETALOL HCL 300 MG PO TABS: 300.0000 mg | ORAL_TABLET | Freq: Two times a day (BID) | ORAL | 2 refills | Status: DC

## 2019-11-21 NOTE — Progress Notes (Signed)
Patient to continue labetalol. Armandina Stammer RN

## 2019-11-27 ENCOUNTER — Inpatient Hospital Stay (HOSPITAL_COMMUNITY): Payer: 59 | Admitting: Anesthesiology

## 2019-11-27 ENCOUNTER — Ambulatory Visit (HOSPITAL_BASED_OUTPATIENT_CLINIC_OR_DEPARTMENT_OTHER): Payer: 59

## 2019-11-27 ENCOUNTER — Ambulatory Visit (INDEPENDENT_AMBULATORY_CARE_PROVIDER_SITE_OTHER): Payer: 59 | Admitting: Advanced Practice Midwife

## 2019-11-27 ENCOUNTER — Encounter (HOSPITAL_COMMUNITY): Payer: Self-pay | Admitting: Obstetrics and Gynecology

## 2019-11-27 ENCOUNTER — Encounter: Payer: Self-pay | Admitting: Advanced Practice Midwife

## 2019-11-27 ENCOUNTER — Inpatient Hospital Stay (HOSPITAL_COMMUNITY)
Admission: AD | Admit: 2019-11-27 | Discharge: 2019-11-30 | DRG: 798 | Disposition: A | Discharge status: Home / Self Care | Payer: 59 | Attending: Obstetrics and Gynecology | Admitting: Obstetrics and Gynecology

## 2019-11-27 ENCOUNTER — Ambulatory Visit: Payer: 59 | Admitting: *Deleted

## 2019-11-27 ENCOUNTER — Other Ambulatory Visit: Payer: Self-pay

## 2019-11-27 VITALS — BP 150/100 | HR 96 | Wt 224.0 lb

## 2019-11-27 VITALS — BP 158/98 | HR 91

## 2019-11-27 DIAGNOSIS — Z3490 Encounter for supervision of normal pregnancy, unspecified, unspecified trimester: Secondary | ICD-10-CM | POA: Diagnosis present

## 2019-11-27 DIAGNOSIS — O10919 Unspecified pre-existing hypertension complicating pregnancy, unspecified trimester: Secondary | ICD-10-CM

## 2019-11-27 DIAGNOSIS — O10013 Pre-existing essential hypertension complicating pregnancy, third trimester: Secondary | ICD-10-CM | POA: Diagnosis not present

## 2019-11-27 DIAGNOSIS — D509 Iron deficiency anemia, unspecified: Secondary | ICD-10-CM | POA: Diagnosis present

## 2019-11-27 DIAGNOSIS — O99214 Obesity complicating childbirth: Secondary | ICD-10-CM | POA: Diagnosis present

## 2019-11-27 DIAGNOSIS — O1414 Severe pre-eclampsia complicating childbirth: Secondary | ICD-10-CM | POA: Diagnosis not present

## 2019-11-27 DIAGNOSIS — Z3480 Encounter for supervision of other normal pregnancy, unspecified trimester: Secondary | ICD-10-CM | POA: Insufficient documentation

## 2019-11-27 DIAGNOSIS — O99213 Obesity complicating pregnancy, third trimester: Secondary | ICD-10-CM

## 2019-11-27 DIAGNOSIS — O10913 Unspecified pre-existing hypertension complicating pregnancy, third trimester: Secondary | ICD-10-CM | POA: Diagnosis not present

## 2019-11-27 DIAGNOSIS — J4520 Mild intermittent asthma, uncomplicated: Secondary | ICD-10-CM | POA: Diagnosis present

## 2019-11-27 DIAGNOSIS — E669 Obesity, unspecified: Secondary | ICD-10-CM | POA: Diagnosis present

## 2019-11-27 DIAGNOSIS — O114 Pre-existing hypertension with pre-eclampsia, complicating childbirth: Principal | ICD-10-CM | POA: Diagnosis present

## 2019-11-27 DIAGNOSIS — R030 Elevated blood-pressure reading, without diagnosis of hypertension: Secondary | ICD-10-CM | POA: Diagnosis present

## 2019-11-27 DIAGNOSIS — Z7982 Long term (current) use of aspirin: Secondary | ICD-10-CM | POA: Diagnosis not present

## 2019-11-27 DIAGNOSIS — O99340 Other mental disorders complicating pregnancy, unspecified trimester: Secondary | ICD-10-CM | POA: Diagnosis present

## 2019-11-27 DIAGNOSIS — Z302 Encounter for sterilization: Secondary | ICD-10-CM | POA: Diagnosis not present

## 2019-11-27 DIAGNOSIS — O1002 Pre-existing essential hypertension complicating childbirth: Secondary | ICD-10-CM | POA: Diagnosis present

## 2019-11-27 DIAGNOSIS — Z20822 Contact with and (suspected) exposure to covid-19: Secondary | ICD-10-CM | POA: Diagnosis present

## 2019-11-27 DIAGNOSIS — O119 Pre-existing hypertension with pre-eclampsia, unspecified trimester: Secondary | ICD-10-CM | POA: Diagnosis present

## 2019-11-27 DIAGNOSIS — O26893 Other specified pregnancy related conditions, third trimester: Secondary | ICD-10-CM

## 2019-11-27 DIAGNOSIS — R519 Headache, unspecified: Secondary | ICD-10-CM

## 2019-11-27 DIAGNOSIS — O4693 Antepartum hemorrhage, unspecified, third trimester: Secondary | ICD-10-CM

## 2019-11-27 DIAGNOSIS — O10019 Pre-existing essential hypertension complicating pregnancy, unspecified trimester: Secondary | ICD-10-CM | POA: Diagnosis present

## 2019-11-27 DIAGNOSIS — O9952 Diseases of the respiratory system complicating childbirth: Secondary | ICD-10-CM | POA: Diagnosis present

## 2019-11-27 DIAGNOSIS — H5319 Other subjective visual disturbances: Secondary | ICD-10-CM

## 2019-11-27 DIAGNOSIS — O9902 Anemia complicating childbirth: Secondary | ICD-10-CM | POA: Diagnosis present

## 2019-11-27 DIAGNOSIS — O113 Pre-existing hypertension with pre-eclampsia, third trimester: Secondary | ICD-10-CM | POA: Diagnosis not present

## 2019-11-27 DIAGNOSIS — I10 Essential (primary) hypertension: Secondary | ICD-10-CM | POA: Diagnosis present

## 2019-11-27 DIAGNOSIS — Z3689 Encounter for other specified antenatal screening: Secondary | ICD-10-CM

## 2019-11-27 DIAGNOSIS — Z3A34 34 weeks gestation of pregnancy: Secondary | ICD-10-CM | POA: Diagnosis not present

## 2019-11-27 DIAGNOSIS — F329 Major depressive disorder, single episode, unspecified: Secondary | ICD-10-CM | POA: Diagnosis present

## 2019-11-27 LAB — PROTEIN / CREATININE RATIO, URINE
Creatinine, Urine: 122.19 mg/dL
Protein Creatinine Ratio: 5.22 mg/mg{Cre} — ABNORMAL HIGH (ref 0.00–0.15)
Total Protein, Urine: 638 mg/dL

## 2019-11-27 LAB — COMPREHENSIVE METABOLIC PANEL
ALT: 12 U/L (ref 0–44)
AST: 32 U/L (ref 15–41)
Albumin: 2.9 g/dL — ABNORMAL LOW (ref 3.5–5.0)
Alkaline Phosphatase: 195 U/L — ABNORMAL HIGH (ref 38–126)
Anion gap: 10 (ref 5–15)
BUN: 5 mg/dL — ABNORMAL LOW (ref 6–20)
CO2: 19 mmol/L — ABNORMAL LOW (ref 22–32)
Calcium: 8.7 mg/dL — ABNORMAL LOW (ref 8.9–10.3)
Chloride: 107 mmol/L (ref 98–111)
Creatinine, Ser: 0.62 mg/dL (ref 0.44–1.00)
GFR calc Af Amer: >60 mL/min (ref >60)
GFR calc non Af Amer: >60 mL/min (ref >60)
Glucose, Bld: 101 mg/dL — ABNORMAL HIGH (ref 70–99)
Potassium: 4.6 mmol/L (ref 3.5–5.1)
Sodium: 136 mmol/L (ref 135–145)
Total Bilirubin: 0.3 mg/dL (ref 0.3–1.2)
Total Protein: 6.8 g/dL (ref 6.5–8.1)

## 2019-11-27 LAB — POCT URINALYSIS DIPSTICK OB: Glucose, UA: NEGATIVE

## 2019-11-27 LAB — URINALYSIS, ROUTINE W REFLEX MICROSCOPIC
Bilirubin Urine: NEGATIVE
Glucose, UA: NEGATIVE mg/dL
Hgb urine dipstick: NEGATIVE
Ketones, ur: NEGATIVE mg/dL
Nitrite: NEGATIVE
Protein, ur: 30 mg/dL — AB
Specific Gravity, Urine: 1.028 (ref 1.005–1.030)
pH: 6 (ref 5.0–8.0)

## 2019-11-27 LAB — CBC
HCT: 34.5 % — ABNORMAL LOW (ref 36.0–46.0)
HCT: 35.6 % — ABNORMAL LOW (ref 36.0–46.0)
Hemoglobin: 11.1 g/dL — ABNORMAL LOW (ref 12.0–15.0)
Hemoglobin: 11.4 g/dL — ABNORMAL LOW (ref 12.0–15.0)
MCH: 27.3 pg (ref 26.0–34.0)
MCH: 26.5 pg (ref 26.0–34.0)
MCHC: 32.2 g/dL (ref 30.0–36.0)
MCHC: 32 g/dL (ref 30.0–36.0)
MCV: 85 fL (ref 80.0–100.0)
MCV: 82.6 fL (ref 80.0–100.0)
Platelets: 250 10*3/uL (ref 150–400)
Platelets: 279 10*3/uL (ref 150–400)
RBC: 4.06 MIL/uL (ref 3.87–5.11)
RBC: 4.31 MIL/uL (ref 3.87–5.11)
RDW: 13.2 % (ref 11.5–15.5)
RDW: 13.3 % (ref 11.5–15.5)
WBC: 12.6 10*3/uL — ABNORMAL HIGH (ref 4.0–10.5)
WBC: 14.9 10*3/uL — ABNORMAL HIGH (ref 4.0–10.5)
nRBC: 0 % (ref 0.0–0.2)
nRBC: 0 % (ref 0.0–0.2)

## 2019-11-27 LAB — TYPE AND SCREEN
ABO/RH(D): A POS
Antibody Screen: NEGATIVE

## 2019-11-27 LAB — SARS CORONAVIRUS 2 BY RT PCR (HOSPITAL ORDER, PERFORMED IN ~~LOC~~ HOSPITAL LAB): SARS Coronavirus 2: NEGATIVE

## 2019-11-27 MED ORDER — PENICILLIN G POT IN DEXTROSE 60000 UNIT/ML IV SOLN
3.0000 10*6.[IU] | INTRAVENOUS | Status: DC
  Administered 2019-11-27 – 2019-11-28 (×3): 3 10*6.[IU] via INTRAVENOUS
  Filled 2019-11-27 (×3): qty 50

## 2019-11-27 MED ORDER — MAGNESIUM SULFATE 40 GM/1000ML IV SOLN
INTRAVENOUS | Status: AC
  Filled 2019-11-27: qty 1000

## 2019-11-27 MED ORDER — HYDRALAZINE HCL 20 MG/ML IJ SOLN
INTRAMUSCULAR | Status: AC
  Administered 2019-11-27: 10 mg via INTRAVENOUS
  Filled 2019-11-27: qty 1

## 2019-11-27 MED ORDER — MAGNESIUM SULFATE 40 GM/1000ML IV SOLN
2.0000 g/h | INTRAVENOUS | Status: DC
  Administered 2019-11-27: 2 g/h via INTRAVENOUS

## 2019-11-27 MED ORDER — FENTANYL-BUPIVACAINE-NACL 0.5-0.125-0.9 MG/250ML-% EP SOLN
12.0000 mL/h | EPIDURAL | Status: DC | PRN
  Filled 2019-11-27: qty 250

## 2019-11-27 MED ORDER — MISOPROSTOL 50MCG HALF TABLET
50.0000 ug | ORAL_TABLET | ORAL | Status: DC | PRN
  Administered 2019-11-27: 50 ug via BUCCAL
  Filled 2019-11-27 (×2): qty 1

## 2019-11-27 MED ORDER — MAGNESIUM SULFATE BOLUS VIA INFUSION
4.0000 g | Freq: Once | INTRAVENOUS | Status: AC
  Administered 2019-11-27: 4 g via INTRAVENOUS
  Filled 2019-11-27: qty 1000

## 2019-11-27 MED ORDER — OXYTOCIN-SODIUM CHLORIDE 30-0.9 UT/500ML-% IV SOLN
1.0000 m[IU]/min | INTRAVENOUS | Status: DC
  Administered 2019-11-27: 2 m[IU]/min via INTRAVENOUS
  Filled 2019-11-27: qty 500

## 2019-11-27 MED ORDER — PHENYLEPHRINE 40 MCG/ML (10ML) SYRINGE FOR IV PUSH (FOR BLOOD PRESSURE SUPPORT): 80.0000 ug | PREFILLED_SYRINGE | INTRAVENOUS | Status: DC | PRN

## 2019-11-27 MED ORDER — ONDANSETRON HCL 4 MG/2ML IJ SOLN: 4.0000 mg | Freq: Four times a day (QID) | INTRAMUSCULAR | Status: DC | PRN

## 2019-11-27 MED ORDER — LIDOCAINE HCL (PF) 1 % IJ SOLN: 30.0000 mL | INTRAMUSCULAR | Status: DC | PRN

## 2019-11-27 MED ORDER — LABETALOL HCL 5 MG/ML IV SOLN
20.0000 mg | INTRAVENOUS | Status: DC | PRN
  Administered 2019-11-27: 20 mg via INTRAVENOUS
  Filled 2019-11-27: qty 4

## 2019-11-27 MED ORDER — ACETAMINOPHEN 325 MG PO TABS: 650.0000 mg | ORAL_TABLET | ORAL | Status: DC | PRN

## 2019-11-27 MED ORDER — HYDRALAZINE HCL 20 MG/ML IJ SOLN
5.0000 mg | INTRAMUSCULAR | Status: DC | PRN
  Administered 2019-11-27 (×2): 5 mg via INTRAVENOUS
  Filled 2019-11-27: qty 1

## 2019-11-27 MED ORDER — TERBUTALINE SULFATE 1 MG/ML IJ SOLN: 0.2500 mg | Freq: Once | INTRAMUSCULAR | Status: DC | PRN

## 2019-11-27 MED ORDER — LABETALOL HCL 5 MG/ML IV SOLN
40.0000 mg | INTRAVENOUS | Status: DC | PRN
  Administered 2019-11-27: 40 mg via INTRAVENOUS
  Filled 2019-11-27: qty 8

## 2019-11-27 MED ORDER — FENTANYL CITRATE (PF) 100 MCG/2ML IJ SOLN
100.0000 ug | INTRAMUSCULAR | Status: DC | PRN
  Administered 2019-11-27: 100 ug via INTRAVENOUS
  Filled 2019-11-27: qty 2

## 2019-11-27 MED ORDER — LACTATED RINGERS IV SOLN
500.0000 mL | Freq: Once | INTRAVENOUS | Status: AC
  Administered 2019-11-27: 500 mL via INTRAVENOUS

## 2019-11-27 MED ORDER — BUTORPHANOL TARTRATE 1 MG/ML IJ SOLN
1.0000 mg | Freq: Once | INTRAMUSCULAR | Status: AC
  Administered 2019-11-27: 1 mg via INTRAVENOUS
  Filled 2019-11-27: qty 1

## 2019-11-27 MED ORDER — SODIUM CHLORIDE 0.9 % IV SOLN
5.0000 10*6.[IU] | Freq: Once | INTRAVENOUS | Status: AC
  Administered 2019-11-27: 5 10*6.[IU] via INTRAVENOUS
  Filled 2019-11-27: qty 5

## 2019-11-27 MED ORDER — LACTATED RINGERS IV SOLN: 500.0000 mL | INTRAVENOUS | Status: DC | PRN

## 2019-11-27 MED ORDER — DIPHENHYDRAMINE HCL 50 MG/ML IJ SOLN: 12.5000 mg | INTRAMUSCULAR | Status: DC | PRN

## 2019-11-27 MED ORDER — SOD CITRATE-CITRIC ACID 500-334 MG/5ML PO SOLN: 30.0000 mL | ORAL | Status: DC | PRN

## 2019-11-27 MED ORDER — EPHEDRINE 5 MG/ML INJ: 10.0000 mg | INTRAVENOUS | Status: DC | PRN

## 2019-11-27 MED ORDER — BETAMETHASONE SOD PHOS & ACET 6 (3-3) MG/ML IJ SUSP
12.0000 mg | INTRAMUSCULAR | Status: DC
  Administered 2019-11-27: 12 mg via INTRAMUSCULAR
  Filled 2019-11-27: qty 5

## 2019-11-27 MED ORDER — LACTATED RINGERS IV SOLN: INTRAVENOUS | Status: DC

## 2019-11-27 MED ORDER — OXYTOCIN-SODIUM CHLORIDE 30-0.9 UT/500ML-% IV SOLN: 2.5000 [IU]/h | INTRAVENOUS | Status: DC

## 2019-11-27 MED ORDER — TRANEXAMIC ACID-NACL 1000-0.7 MG/100ML-% IV SOLN
1000.0000 mg | Freq: Once | INTRAVENOUS | Status: AC
  Administered 2019-11-28: 1000 mg via INTRAVENOUS
  Filled 2019-11-27: qty 100

## 2019-11-27 MED ORDER — LIDOCAINE HCL (PF) 1 % IJ SOLN
INTRAMUSCULAR | Status: DC | PRN
  Administered 2019-11-27 (×2): 6 mL via EPIDURAL

## 2019-11-27 MED ORDER — SODIUM CHLORIDE (PF) 0.9 % IJ SOLN
INTRAMUSCULAR | Status: DC | PRN
  Administered 2019-11-27: 12 mL/h via EPIDURAL

## 2019-11-27 MED ORDER — LABETALOL HCL 200 MG PO TABS
300.0000 mg | ORAL_TABLET | Freq: Two times a day (BID) | ORAL | Status: DC
  Administered 2019-11-27: 300 mg via ORAL
  Filled 2019-11-27: qty 1

## 2019-11-27 MED ORDER — HYDRALAZINE HCL 20 MG/ML IJ SOLN
10.0000 mg | INTRAMUSCULAR | Status: DC | PRN
  Administered 2019-11-27: 10 mg via INTRAVENOUS

## 2019-11-27 MED ORDER — OXYTOCIN BOLUS FROM INFUSION
333.0000 mL | Freq: Once | INTRAVENOUS | Status: AC
  Administered 2019-11-28: 333 mL via INTRAVENOUS

## 2019-11-27 NOTE — Progress Notes (Signed)
Patient complaining of headache. Haydyn Girvan RN  

## 2019-11-27 NOTE — Patient Instructions (Signed)

## 2019-11-27 NOTE — Progress Notes (Signed)
LABOR PROGRESS NOTE  Megan Hunt is a 34 y.o. [id number] at [redacted]w[redacted]d admitted for IOL 2/2 preeclampsia with severe features superimposed on cHTN.  Subjective: Very uncomfortable with FB.   Objective: BP (!) 170/87   Pulse 100   Temp 98.2 F (36.8 C) (Oral)   Resp 16   Ht 5\' 4"  (1.626 m)   Wt 102.1 kg   LMP 04/01/2019 (LMP Unknown)   SpO2 98%   BMI 38.64 kg/m  or  Vitals:   11/27/19 1937 11/27/19 2028 11/27/19 2049 11/27/19 2140  BP: (!) 152/88 (!) 155/92  (!) 170/87  Pulse: 97 (!) 102  100  Resp:  16  16  Temp:    98.2 F (36.8 C)  TempSrc:    Oral  SpO2:      Weight:   102.1 kg   Height:   5\' 4"  (1.626 m)    Dilation: 4.5 Effacement (%): 50 Cervical Position: Posterior Station: -3 Presentation: Vertex Exam by:: Dr. FHT: Baseline rate 125, moderate varibility, + accels, - decels Toco:q2m  Labs: Lab Results  Component Value Date   WBC 12.6 (H) 11/27/2019   HGB 11.1 (L) 11/27/2019   HCT 34.5 (L) 11/27/2019   MCV 85.0 11/27/2019   PLT 250 11/27/2019    Patient Active Problem List   Diagnosis Date Noted  . Encounter for induction of labor 11/27/2019  . Pre-eclampsia superimposed on chronic hypertension, antepartum 11/27/2019  . Supervision of other normal pregnancy, antepartum 06/05/2019  . Sinusitis 07/13/2018  . Preventative health care 11/27/2017  . Anemia 11/27/2017  . Depression affecting pregnancy, antepartum 06/01/2017  . Insomnia 05/15/2016  . Fatigue 08/08/2014  . Hemorrhoids, external 01/05/2011  . Fibromyalgia 01/05/2011  . Constipation 01/03/2010  . Essential hypertension antepartum 09/29/2009  . HYPERCHOLESTEROLEMIA 10/27/2008  . ANXIETY DEPRESSION 10/27/2008  . Allergic rhinitis 10/27/2008  . Asthma, mild intermittent 10/27/2008  . Backache 10/27/2008  . SOMATIC DYSFUNCTION 10/27/2008  . CHEST PAIN, ATYPICAL 10/27/2008  . Migraine 03/29/2007    Assessment / Plan: 34 y.o. 03/31/2008 at [redacted]w[redacted]d here for IOL 2/2 preeclampsia with  severe features superimposed on cHTN requiring medication (Labetalol).  Labor: Progressing well. S/p FB and Cyto x1. Will start Pitocin and consider AROM once comfortable with epidural. Anticipate SVD. 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  Pre-eclampsia with SF S/I cHTN: On Mag and anti-hypertensive protocol in addition to home Labetalol 300 mg BID. No symptoms at this time. Mild range pressures currently. Pr:Cr 5.22 but other labs stable. Will continue to monitor.  Fetal Wellbeing: Cat 1  Pain Control: desires epidural   [id number], MD [facility] Fellow, [facility] for [facility], [facility] 11/27/2019, 10:00 PM

## 2019-11-27 NOTE — Anesthesia Preprocedure Evaluation (Signed)
Anesthesia Evaluation  Patient identified by MRN, date of birth, ID band Patient awake    Reviewed: Allergy & Precautions, H&P , NPO status , Patient's Chart, lab work & pertinent test results, reviewed documented beta blocker date and time   Airway Mallampati: II  TM Distance: >3 FB Neck ROM: full    Dental no notable dental hx.    Pulmonary asthma ,    Pulmonary exam normal breath sounds clear to auscultation       Cardiovascular hypertension, Pt. on home beta blockers Normal cardiovascular exam Rhythm:regular Rate:Normal     Neuro/Psych    GI/Hepatic negative GI ROS, Neg liver ROS,   Endo/Other  negative endocrine ROS  Renal/GU negative Renal ROS  negative genitourinary   Musculoskeletal   Abdominal (+) + obese,   Peds negative pediatric ROS (+)  Hematology negative hematology ROS (+) Blood dyscrasia, anemia ,   Anesthesia Other Findings   Reproductive/Obstetrics (+) Pregnancy                             Anesthesia Physical  Anesthesia Plan  ASA: II  Anesthesia Plan: Epidural   Post-op Pain Management:    Induction:   PONV Risk Score and Plan:   Airway Management Planned:   Additional Equipment:   Intra-op Plan:   Post-operative Plan:   Informed Consent: I have reviewed the patients History and Physical, chart, labs and discussed the procedure including the risks, benefits and alternatives for the proposed anesthesia with the patient or authorized representative who has indicated his/her understanding and acceptance.       Plan Discussed with:   Anesthesia Plan Comments:         Anesthesia Quick Evaluation

## 2019-11-27 NOTE — Consult Note (Signed)
[facility]  [facility] 11/27/2019    10:44 PM  Neonatal Medicine Consultation         Megan Hunt          MRN:  [id number]  I was called at the request of the patient's obstetrician (Drs Donavan Foil and [facility]) to speak to this patient due to impending delivery at 34 weeks.  The patient's prenatal course includes chronic hypertension with superimposed preeclampsia with severe features, cholestasis, anxiety/depression, asthma.  She is 33 0/7 weeks currently.  She is admitted to L&D, and is receiving treatment that includes induction of labor due to the hypertension.  The baby is a female.  I reviewed expectations for a baby born at 34 weeks, including survival, length of stay, and other issues such as respiratory distress, infection, feeding.  I described how we provide respiratory and feeding support.  Mom plans to breast feed, which I encouraged as best for the baby (with supplementations for needed calories).    I spent 30 minutes reviewing the record, speaking to the patient, and entering appropriate documentation.  More than 50% of the time was spent face to face with patient.   _____________________ Angelita Ingles, MD Attending Neonatologist

## 2019-11-27 NOTE — Progress Notes (Signed)
LABOR PROGRESS NOTE  Megan Hunt is a 34 y.o. Y1O1751 at [redacted]w[redacted]d admitted for IOL 2/2 preeclampsia with severe features superimposed on cHTN requiring medication (Labetalol).   Subjective: Patient is doing well. No specific concerns at this time. Feeling some cramping but nothing severe. FOB at bedside.  Objective: BP (!) 155/92   Pulse (!) 102   Temp 97.9 F (36.6 C) (Oral)   Resp 16   Ht 5\' 5"  (1.651 m)   Wt 102.1 kg   LMP 04/01/2019 (LMP Unknown)   SpO2 98%   BMI 37.44 kg/m  or  Vitals:   11/27/19 1750 11/27/19 1925 11/27/19 1937 11/27/19 2028  BP: 137/76 (!) 166/93 (!) 152/88 (!) 155/92  Pulse: 89 94 97 (!) 102  Resp:  16  16  Temp:      TempSrc:      SpO2:      Weight:      Height:       Sterile speculum exam: Cervix easily visualized, approximately 1 cm dilated, FB placed without complication.  Dilation: 1 Effacement (%): Thick Cervical Position: Posterior Station: Ballotable Presentation: Vertex Exam by:: Mary [phone] Johnson, RN  FHT: Baseline rate 135, moderate varibility, + accels, - decels Toco: Contractions every   Labs: Lab Results  Component Value Date   WBC 12.6 (H) 11/27/2019   HGB 11.1 (L) 11/27/2019   HCT 34.5 (L) 11/27/2019   MCV 85.0 11/27/2019   PLT 250 11/27/2019    Patient Active Problem List   Diagnosis Date Noted  . Encounter for induction of labor 11/27/2019  . Pre-eclampsia superimposed on chronic hypertension, antepartum 11/27/2019  . Supervision of other normal pregnancy, antepartum 06/05/2019  . Sinusitis 07/13/2018  . Preventative health care 11/27/2017  . Anemia 11/27/2017  . Depression affecting pregnancy, antepartum 06/01/2017  . Insomnia 05/15/2016  . Fatigue 08/08/2014  . Hemorrhoids, external 01/05/2011  . Fibromyalgia 01/05/2011  . Constipation 01/03/2010  . Essential hypertension antepartum 09/29/2009  . HYPERCHOLESTEROLEMIA 10/27/2008  . ANXIETY DEPRESSION 10/27/2008  . Allergic rhinitis 10/27/2008  . Asthma,  mild intermittent 10/27/2008  . Backache 10/27/2008  . SOMATIC DYSFUNCTION 10/27/2008  . CHEST PAIN, ATYPICAL 10/27/2008  . Migraine 03/29/2007    Assessment / Plan: 34 y.o. 20 at [redacted]w[redacted]d here for IOL 2/2 preeclampsia with severe features superimposed on cHTN requiring medication (Labetalol).  Labor: SVE last check 1/thick/-3. S/p Cytotec x1. Discussed FB placement to help with cervical ripening. Patient agreeable. FB placed at 2030 via sterile speculum exam without complication. Visually unchanged cervical dilation. Will plan to repeat buccal Cytotec as well and reassess in 4 hours. Pre-eclampsia with SF S/I cHTN: On Mag and anti-hypertensive protocol in addition to home Labetalol 300 mg BID. No symptoms at this time. Mild range pressures currently. Pr:Cr 5.22 but other labs stable. Will continue to monitor.  Fetal Wellbeing: Cat 1 tracing Pain Control: Controlled at this time; per patient request Anticipated MOD: NSVD  [redacted]w[redacted]d, MD Family Medicine, PGY-3 11/27/2019, 8:43 PM

## 2019-11-27 NOTE — Progress Notes (Signed)
   PRENATAL VISIT NOTE  Subjective:  Megan Hunt is a 34 y.o. O2U2353 at [redacted]w[redacted]d being seen today for ongoing prenatal care.  She is currently monitored for the following issues for this high-risk pregnancy and has HYPERCHOLESTEROLEMIA; ANXIETY DEPRESSION; Essential hypertension antepartum; Allergic rhinitis; Asthma, mild intermittent; Constipation; Backache; SOMATIC DYSFUNCTION; CHEST PAIN, ATYPICAL; Migraine; Hemorrhoids, external; Fibromyalgia; Fatigue; Insomnia; Depression affecting pregnancy, antepartum; Preventative health care; Anemia; Sinusitis; and Supervision of other normal pregnancy, antepartum on their problem list.  Patient reports headache and seeing spots. Has had headaches off and on for several weeks.   VIsual change is new.  Denies RUQ pain.   Contractions: Irritability. Vag. Bleeding: None.  Movement: Present. Denies leaking of fluid.   The following portions of the patient's history were reviewed and updated as appropriate: allergies, current medications, past family history, past medical history, past social history, past surgical history and problem list.   Objective:   Vitals:   11/27/19 1125  BP: (!) 150/100  Pulse: 96  Weight: 224 lb (101.6 kg)    Fetal Status: Fetal Heart Rate (bpm): 136   Movement: Present     General:  Alert, oriented and cooperative. Patient is in no acute distress.  Skin: Skin is warm and dry. No rash noted.   Cardiovascular: Normal heart rate noted  Respiratory: Normal respiratory effort, no problems with respiration noted  Abdomen: Soft, gravid, appropriate for gestational age.  Pain/Pressure: Present     Pelvic: Cervical exam deferred        Extremities: Normal range of motion.  Edema: Trace    DTRs brisk 3+, no clonus, trace edema  Mental Status: Normal mood and affect. Normal behavior. Normal judgment and thought content.   Assessment and Plan:  Pregnancy: G5P4004 at [redacted]w[redacted]d 1. Chronic hypertension during pregnancy, antepartum      Labetalol 300mg  bid     for growth today.  Will go to MAU after [place]     1+ dip urine protein today  - POC Urinalysis Dipstick OB  2. Supervision of other normal pregnancy, antepartum   3. Pregnancy headache in third trimester     Will send to MAU for Preeclampsia labs  4. Photopsia     MAU for labs/eval  Preterm labor symptoms and general obstetric precautions including but not limited to vaginal bleeding, contractions, leaking of fluid and fetal movement were reviewed in detail with the patient. Please refer to After Visit Summary for other counseling recommendations.     Future Appointments  Date Time Provider Department Center  11/27/2019  1:30 PM [facility]  11/27/2019  1:45 PM [facility] US4 [facility]  12/04/2019  1:30 PM [facility] NURSE [facility]  12/04/2019  1:45 PM [facility] US4 [facility]  12/11/2019 10:50 AM 02/10/2020, CNM [facility] None  12/19/2019  4:00 PM 02/18/2020, MD [facility] None  01/29/2020  9:20 AM 01/31/2020, MD [facility]    Bradd Canary, CNM

## 2019-11-27 NOTE — Anesthesia Procedure Notes (Signed)
Epidural Patient location during procedure: OB Start time: 11/27/2019 11:13 PM End time: 11/27/2019 11:16 PM  Staffing Anesthesiologist: Leilani Able, MD Performed: anesthesiologist   Preanesthetic Checklist Completed: patient identified, IV checked, site marked, risks and benefits discussed, surgical consent, monitors and equipment checked, pre-op evaluation and timeout performed  Epidural Patient position: sitting Prep: DuraPrep and site prepped and draped Patient monitoring: continuous pulse ox and blood pressure Approach: midline Location: L3-L4 Injection technique: LOR air  Needle:  Needle type: Tuohy  Needle gauge: 17 G Needle length: 9 cm and 9 Needle insertion depth: 8 cm Catheter type: closed end flexible Catheter size: 19 Gauge Catheter at skin depth: 13 cm Test dose: negative and Other  Assessment Events: blood not aspirated, injection not painful, no injection resistance, no paresthesia and negative IV test  Additional Notes Reason for block:procedure for pain

## 2019-11-27 NOTE — MAU Provider Note (Signed)
Chief Complaint  Patient presents with  . Hypertension     First Provider Initiated Contact with Patient 11/27/19 1506      S: Megan Hunt  is a 34 y.o. y.o. year old G69P4004 female at [redacted]w[redacted]d weeks gestation who presents to MAU with elevated blood pressures. Has Hx of hypertension. Current blood pressure medication: labetalol 300mg  BID  Associated symptoms: Headache, vision changes Contractions: no Vaginal bleeding: no Fetal movement: normal, present  O:  Patient Vitals for the past 24 hrs:  BP Temp Pulse Resp SpO2 Height Weight  11/27/19 1652 (!) 146/100 -- (!) 104 -- -- -- --  11/27/19 1616 126/80 -- (!) 110 -- -- -- --  11/27/19 1615 -- -- -- -- 98 % -- --  11/27/19 1610 -- -- -- -- 98 % -- --  11/27/19 1605 -- -- -- -- 99 % -- --  11/27/19 1603 (!) 145/79 -- (!) 104 -- -- -- --  11/27/19 1600 -- -- -- -- 98 % -- --  11/27/19 1555 -- -- -- -- 98 % -- --  11/27/19 1550 -- -- -- -- 98 % -- --  11/27/19 1548 (!) 151/79 -- (!) 107 -- -- -- --  11/27/19 1545 -- -- -- -- 98 % -- --  11/27/19 1540 -- -- -- -- 99 % -- --  11/27/19 1535 (!) 171/84 -- (!) 123 -- 99 % -- --  11/27/19 1534 (!) 171/84 -- (!) 123 -- 99 % -- --  11/27/19 1531 -- -- -- -- 99 % -- --  11/27/19 1530 -- -- -- -- 99 % -- --  11/27/19 1525 -- -- -- -- 99 % -- --  11/27/19 1520 -- -- -- -- 99 % -- --  11/27/19 1518 (!) 162/90 -- 95 -- -- -- --  11/27/19 1500 (!) 167/98 -- 92 -- 100 % -- --  11/27/19 1451 (!) 165/95 98.1 F (36.7 C) 93 18 100 % 5\' 5"  (1.651 m) --  11/27/19 1444 -- -- -- -- -- -- 102.1 kg   General: NAD Heart: Regular rate Lungs: Normal rate and effort Abd: Soft, NT, Gravid, S=D Extremities: No Pedal edema Neuro: 2+ deep tendon reflexes, No clonus  EFM: reactive       -baseline: 140       -variability: moderate       -accels: present, 15x15       -decels: absent       -TOCO: few, irregular, pt not feeling  Dilation: 1 Effacement (%): Thick Cervical Position:  Posterior Presentation: Vertex (per U/S) Exam by:: N Blinda Turek NP  Results for orders placed or performed during the hospital encounter of 11/27/19 (from the past 24 hour(s))  Urinalysis, Routine w reflex microscopic     Status: Abnormal   Collection Time: 11/27/19  2:44 PM  Result Value Ref Range   Color, Urine AMBER (A) YELLOW   APPearance CLOUDY (A) CLEAR   Specific Gravity, Urine 1.028 1.005 - 1.030   pH 6.0 5.0 - 8.0   Glucose, UA NEGATIVE NEGATIVE mg/dL   Hgb urine dipstick NEGATIVE NEGATIVE   Bilirubin Urine NEGATIVE NEGATIVE   Ketones, ur NEGATIVE NEGATIVE mg/dL   Protein, ur 30 (A) NEGATIVE mg/dL   Nitrite NEGATIVE NEGATIVE   Leukocytes,Ua LARGE (A) NEGATIVE   RBC / HPF 0-5 0 - 5 RBC/hpf   WBC, UA 21-50 0 - 5 WBC/hpf   Bacteria, UA RARE (A) NONE SEEN   Squamous Epithelial / LPF 21-50  0 - 5   Mucus PRESENT   Comprehensive metabolic panel     Status: Abnormal   Collection Time: 11/27/19  2:56 PM  Result Value Ref Range   Sodium 136 135 - 145 mmol/L   Potassium 4.6 3.5 - 5.1 mmol/L   Chloride 107 98 - 111 mmol/L   CO2 19 (L) 22 - 32 mmol/L   Glucose, Bld 101 (H) 70 - 99 mg/dL   BUN 5 (L) 6 - 20 mg/dL   Creatinine, Ser 0.27 0.44 - 1.00 mg/dL   Calcium 8.7 (L) 8.9 - 10.3 mg/dL   Total Protein 6.8 6.5 - 8.1 g/dL   Albumin 2.9 (L) 3.5 - 5.0 g/dL   AST 32 15 - 41 U/L   ALT 12 0 - 44 U/L   Alkaline Phosphatase 195 (H) 38 - 126 U/L   Total Bilirubin 0.3 0.3 - 1.2 mg/dL   GFR calc non Af Amer >60 >60 mL/min   GFR calc Af Amer >60 >60 mL/min   Anion gap 10 5 - 15  CBC     Status: Abnormal   Collection Time: 11/27/19  2:56 PM  Result Value Ref Range   WBC 12.6 (H) 4.0 - 10.5 K/uL   RBC 4.06 3.87 - 5.11 MIL/uL   Hemoglobin 11.1 (L) 12.0 - 15.0 g/dL   HCT 74.1 (L) 36 - 46 %   MCV 85.0 80.0 - 100.0 fL   MCH 27.3 26.0 - 34.0 pg   MCHC 32.2 30.0 - 36.0 g/dL   RDW 28.7 86.7 - 67.2 %   Platelets 250 150 - 400 K/uL   nRBC 0.0 0.0 - 0.2 %  Protein / creatinine ratio, urine      Status: Abnormal   Collection Time: 11/27/19  2:56 PM  Result Value Ref Range   Creatinine, Urine 122.19 mg/dL   Total Protein, Urine 638 mg/dL   Protein Creatinine Ratio 5.22 (H) 0.00 - 0.15 mg/mg[Cre]  Type and screen [facility]     Status: None (Preliminary result)   Collection Time: 11/27/19  3:12 PM  Result Value Ref Range   ABO/RH(D) PENDING    Antibody Screen PENDING    Sample Expiration      11/30/2019,2359 Performed at [facility], [address], [place], [place] [address]    Pt informed that the ultrasound is considered a limited OB ultrasound and is not intended to be a complete ultrasound exam.  Patient also informed that the ultrasound is not being completed with the intent of assessing for fetal or placental anomalies or any pelvic abnormalities.  Explained that the purpose of today's ultrasound is to assess for  presentation (VTX).  Patient acknowledges the purpose of the exam and the limitations of the study.     MDM  A:  1. Chronic hypertension with superimposed pre-eclampsia   2. [redacted] weeks gestation of pregnancy   3. NST (non-stress test) reactive     P:  -admit to L&D for induction/Mg/betamethasone per consultation with Dr. Juliene Pina, Odie Sera, NP 11/27/2019 5:09 PM

## 2019-11-27 NOTE — H&P (Addendum)
OBSTETRIC ADMISSION HISTORY AND PHYSICAL  Megan Hunt is a 34 y.o. female [phone] with IUP at [redacted]w[redacted]d by 8+ wks [place] presenting for IOL 2/2 elevated blood pressure, blurry vision, and headache (preeclampsia with severe features superimposed on cHTN). She was sent from the office for pre-eclampsia workup and was found to have significant elevation of BP, blurry visions and reported headaches. She reports +FMs, No LOF, no VB, or peripheral edema, and RUQ pain.  She plans on breast feeding. She request BTL for birth control.  She received her prenatal care at [facility]  Dating: By 8+ wks [place] --->  Estimated Date of Delivery: 01/08/20  Sono:   @[redacted]w[redacted]d , CWD, normal anatomy, Cephalic presentation, 92%  lie, 2064 g, 4 lb 9 oz EFW  Prenatal History/Complications:  Chronic hypertension on Labetalol  Depression affect pregnancy H/o PPH H/o precipitous delivery  Past Medical History: Past Medical History:  Diagnosis Date  . Allergic rhinitis   . Allergy   . Anemia   . Anxiety   . Anxiety and depression   . Asthma   . Asthma, mild intermittent 10/27/2008   Qualifier: Diagnosis of  By: 10/29/2008 MD, Kriste Basque   . Atypical chest pain   . Back pain   . Constipation   . History of migraines   . Hypercholesteremia   . Hypertension    no longer taking meds  . Insomnia 05/15/2016  . Somatic dysfunction     Past Surgical History: Past Surgical History:  Procedure Laterality Date  . WISDOM TOOTH EXTRACTION      Obstetrical History: OB History    Gravida  5   Para  4   Term  4   Preterm      AB      Living  4     SAB      TAB      Ectopic      Multiple  0   Live Births  4           Social History Social History   Socioeconomic History  . Marital status: Married    Spouse name: Not on file  . Number of children: 2  . Years of education: Not on file  . Highest education level: Not on file  Occupational History  . 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 Smoking status: Never Smoker  . Smokeless tobacco: Never Used  Vaping Use  . Vaping Use: Never used  Substance and Sexual Activity  . Alcohol use: No  . Drug use: No  . Sexual activity: Not Currently    Birth control/protection: None    Comment: lives with husband and 3 small children, no dietary restrictions, stays at home  Other Topics Concern  . Not on file  Social History Narrative   Son 07/13/2016 born 02/2008 and daughter 03/2008 born 06/2009   07/2009 born 03/22/2013   Social Determinants of Health   Financial Resource Strain:   . Difficulty of Paying Living Expenses:   Food Insecurity:   . Worried About 03/24/2013 in the Last Year:   . Programme researcher, broadcasting/film/video in the Last Year:   Transportation Needs:   . Barista (Medical):   Freight forwarder Lack of Transportation (Non-Medical):   Physical Activity:   . Days of Exercise per Week:   . Minutes of Exercise per Session:  Stress:   . Feeling of Stress :   Social Connections:   . Frequency of Communication with Friends and Family:   . Frequency of Social Gatherings with Friends and Family:   . Attends Religious Services:   . Active Member of Clubs or Organizations:   . Attends Banker Meetings:   Marland Kitchen Marital Status:     Family History: Family History  Problem Relation Age of Onset  . Hypertension Father   . Hypertension Mother   . Arthritis Brother   . Stroke Maternal Grandfather   . Hypertension Maternal Grandfather   . Cancer Sister   . Birth defects Paternal Aunt     Allergies: No Known Allergies  Medications Prior to Admission  Medication Sig Dispense Refill Last Dose  . ASPIRIN 81 PO Take by mouth.   11/26/2019 at Unknown time  . ferrous gluconate (FERGON) 324 MG tablet Take 1 tablet (324 mg total) by mouth daily with breakfast. 90 tablet 3 11/27/2019 at Unknown time  . labetalol (NORMODYNE) 300 MG tablet Take 1 tablet  (300 mg total) by mouth 2 (two) times daily. 60 tablet 2 11/27/2019 at Unknown time  . Doxylamine-Pyridoxine (DICLEGIS) 10-10 MG TBEC Take 2 tablets by mouth at bedtime as needed. (Patient not taking: Reported on 11/16/2019) 100 tablet 1      Review of Systems   All systems reviewed and negative except as stated in HPI  Blood pressure 140/78, pulse 96, temperature 97.9 F (36.6 C), temperature source Oral, resp. rate 17, height 5\' 5"  (1.651 m), weight 102.1 kg, last menstrual period 04/01/2019, SpO2 98 %, currently breastfeeding. General appearance: alert, cooperative, appears stated age and no distress Lungs: clear to auscultation bilaterally Heart: regular rate and rhythm Abdomen: soft, non-tender; bowel sounds normal Pelvic: 1/thick Extremities: Homans sign is negative, no sign of DVT DTR's +2 b/l UE and LE Presentation: cephalic Fetal monitoringBaseline: 140 bpm, Variability: Good {> 6 bpm), Accelerations: Reactive and Decelerations: Absent Uterine activity: irregular  Dilation: 1 Effacement (%): Thick Station: Ballotable Exam by:: Mary [email or web address] Johnson, RN    Prenatal labs: ABO, Rh: --/--/A POS (07/20 1512) Antibody: NEG (07/20 1512) Rubella: 3.50 (01/26 1105) RPR: Non Reactive (06/11 0953)  HBsAg: Negative (01/26 1105)  HIV: Non Reactive (06/11 0953)  GBS:  Patient is preterm  1 hr Glucola 118 Genetic screening N/A Anatomy 04-03-1972 normal  Prenatal Transfer Tool  Maternal Diabetes: No Genetic Screening: Unknown  Maternal Ultrasounds/Referrals: Normal Fetal Ultrasounds or other Referrals:  Referred to Materal Fetal Medicine  Maternal Substance Abuse:  No Significant Maternal Medications:  Meds include: Other: labetalol 300 mg BID Significant Maternal Lab Results: Other: P/CR ratio 5.22  Results for orders placed or performed during the hospital encounter of 11/27/19 (from the past 24 hour(s))  Urinalysis, Routine w reflex microscopic   Collection Time: 11/27/19  2:44 PM   Result Value Ref Range   Color, Urine AMBER (A) YELLOW   APPearance CLOUDY (A) CLEAR   Specific Gravity, Urine 1.028 1.005 - 1.030   pH 6.0 5.0 - 8.0   Glucose, UA NEGATIVE NEGATIVE mg/dL   Hgb urine dipstick NEGATIVE NEGATIVE   Bilirubin Urine NEGATIVE NEGATIVE   Ketones, ur NEGATIVE NEGATIVE mg/dL   Protein, ur 30 (A) NEGATIVE mg/dL   Nitrite NEGATIVE NEGATIVE   Leukocytes,Ua LARGE (A) NEGATIVE   RBC / HPF 0-5 0 - 5 RBC/hpf   WBC, UA 21-50 0 - 5 WBC/hpf   Bacteria, UA RARE (A) NONE SEEN  Squamous Epithelial / LPF 21-50 0 - 5   Mucus PRESENT   Comprehensive metabolic panel   Collection Time: 11/27/19  2:56 PM  Result Value Ref Range   Sodium 136 135 - 145 mmol/L   Potassium 4.6 3.5 - 5.1 mmol/L   Chloride 107 98 - 111 mmol/L   CO2 19 (L) 22 - 32 mmol/L   Glucose, Bld 101 (H) 70 - 99 mg/dL   BUN 5 (L) 6 - 20 mg/dL   Creatinine, Ser 4.090.62 0.44 - 1.00 mg/dL   Calcium 8.7 (L) 8.9 - 10.3 mg/dL   Total Protein 6.8 6.5 - 8.1 g/dL   Albumin 2.9 (L) 3.5 - 5.0 g/dL   AST 32 15 - 41 U/L   ALT 12 0 - 44 U/L   Alkaline Phosphatase 195 (H) 38 - 126 U/L   Total Bilirubin 0.3 0.3 - 1.2 mg/dL   GFR calc non Af Amer >60 >60 mL/min   GFR calc Af Amer >60 >60 mL/min   Anion gap 10 5 - 15  CBC   Collection Time: 11/27/19  2:56 PM  Result Value Ref Range   WBC 12.6 (H) 4.0 - 10.5 K/uL   RBC 4.06 3.87 - 5.11 MIL/uL   Hemoglobin 11.1 (L) 12.0 - 15.0 g/dL   HCT 81.134.5 (L) 36 - 46 %   MCV 85.0 80.0 - 100.0 fL   MCH 27.3 26.0 - 34.0 pg   MCHC 32.2 30.0 - 36.0 g/dL   RDW 91.413.2 78.211.5 - 95.615.5 %   Platelets 250 150 - 400 K/uL   nRBC 0.0 0.0 - 0.2 %  Protein / creatinine ratio, urine   Collection Time: 11/27/19  2:56 PM  Result Value Ref Range   Creatinine, Urine 122.19 mg/dL   Total Protein, Urine 638 mg/dL   Protein Creatinine Ratio 5.22 (H) 0.00 - 0.15 mg/mg[Cre]  Type and screen [facility]   Collection Time: 11/27/19  3:12 PM  Result Value Ref Range   ABO/RH(D) A POS     Antibody Screen NEG    Sample Expiration      11/30/2019,2359 Performed at [facility], [address] [place], [place] [address]   SARS Coronavirus 2 by RT PCR (hospital order, performed in [facility] hospital lab) Nasopharyngeal Nasopharyngeal Swab   Collection Time: 11/27/19  4:38 PM   Specimen: Nasopharyngeal Swab  Result Value Ref Range   SARS Coronavirus 2 NEGATIVE NEGATIVE  Results for orders placed or performed in visit on 11/27/19 (from the past 24 hour(s))  POC Urinalysis Dipstick OB   Collection Time: 11/27/19 11:41 AM  Result Value Ref Range   Color, UA     Clarity, UA     Glucose, UA Negative Negative   Bilirubin, UA     Ketones, UA     Spec Grav, UA     Blood, UA     pH, UA     POC,PROTEIN,UA Small (1+) Negative, Trace, Small (1+), Moderate (2+), Large (3+), 4+   Urobilinogen, UA     Nitrite, UA     Leukocytes, UA     Appearance     Odor      Patient Active Problem List   Diagnosis Date Noted  . Encounter for induction of labor 11/27/2019  . Pre-eclampsia superimposed on chronic hypertension, antepartum 11/27/2019  . Supervision of other normal pregnancy, antepartum 06/05/2019  . Sinusitis 07/13/2018  . Preventative health care 11/27/2017  . Anemia 11/27/2017  .  Depression affecting pregnancy, antepartum 06/01/2017  . Insomnia 05/15/2016  . Fatigue 08/08/2014  . Hemorrhoids, external 01/05/2011  . Fibromyalgia 01/05/2011  . Constipation 01/03/2010  . Essential hypertension antepartum 09/29/2009  . HYPERCHOLESTEROLEMIA 10/27/2008  . ANXIETY DEPRESSION 10/27/2008  . Allergic rhinitis 10/27/2008  . Asthma, mild intermittent 10/27/2008  . Backache 10/27/2008  . SOMATIC DYSFUNCTION 10/27/2008  . CHEST PAIN, ATYPICAL 10/27/2008  . Migraine 03/29/2007    Assessment/Plan:  KERRIANNE JENG is a 34 y.o. K2H0623 at [redacted]w[redacted]d here for IOL 2/2 preeclampsia with severe features superimposed on cHTN requiring medication (labetalol).   #Labor: Bishop  score 1, start with cytotec. Consider FB when able. #Pain: Per patient request  #FWB: Category I. S/p BMZ x1, will try to give 2nd dose if patient is still pregnant. NICU consulted- Dr. Katrinka Blazing aware and will see the patient. #ID:  GBS unknown. PCN, as patient is preterm. #MOF: Breast #MOC: BTL #Preeclampsia with severe features superimposed on CHTN: Continue home regimen Labetalol 300 mg BID. P:Cr 5.22, otherwise labs are grossly unremarkable. On Magnesium for seizure prophylaxis. Antihypertensive protocol in place. No severe range pressures since arrival to L&D. Monitor Vitals closely.  #H/o PPH: plan for TXA at delivery  Anticipate Vaginal Delivery   Phill Myron, DO  11/27/2019, 6:47 PM   GME ATTESTATION:  I saw and evaluated the patient. I agree with the findings and the plan of care as documented in the resident's note with addition of the following:  Risks and benefits of induction were reviewed, including failure of method, prolonged labor, need for further intervention, risk of cesarean.  Patient and family seem to understand these risks and wish to proceed. Options of cytotec, foley bulb, AROM, and pitocin reviewed, with use of each discussed.  Marlowe Alt, DO OB Fellow, Faculty [facility], Center for [facility] 11/27/2019 6:55 PM

## 2019-11-27 NOTE — MAU Note (Signed)
.   Megan Hunt is a 34 y.o. at [redacted]w[redacted]d here in MAU reporting: she was sent from the office for [facility] workup. Increase in B/P in office today. Pt reports headache all day.    Onset of complaint: today Pain score:6 Vitals:   11/27/19 1451  BP: (!) 165/95  Pulse: 93  Resp: 18  Temp: 98.1 F (36.7 C)  SpO2: 100%     FHT:135 Lab orders placed from triage: UA

## 2019-11-28 ENCOUNTER — Encounter (HOSPITAL_COMMUNITY): Payer: Self-pay | Admitting: Obstetrics and Gynecology

## 2019-11-28 ENCOUNTER — Inpatient Hospital Stay (HOSPITAL_COMMUNITY): Payer: 59 | Admitting: Anesthesiology

## 2019-11-28 ENCOUNTER — Encounter (HOSPITAL_COMMUNITY)
Admission: AD | Disposition: A | Discharge status: Home / Self Care | Payer: Self-pay | Source: Home / Self Care | Attending: Obstetrics and Gynecology

## 2019-11-28 DIAGNOSIS — Z302 Encounter for sterilization: Secondary | ICD-10-CM

## 2019-11-28 DIAGNOSIS — Z3A34 34 weeks gestation of pregnancy: Secondary | ICD-10-CM

## 2019-11-28 DIAGNOSIS — O1414 Severe pre-eclampsia complicating childbirth: Secondary | ICD-10-CM

## 2019-11-28 HISTORY — PX: TUBAL LIGATION: SHX77

## 2019-11-28 LAB — RPR: RPR Ser Ql: NONREACTIVE

## 2019-11-28 SURGERY — LIGATION, FALLOPIAN TUBE, POSTPARTUM
Anesthesia: Epidural | Laterality: Bilateral

## 2019-11-28 MED ORDER — DIPHENHYDRAMINE HCL 25 MG PO CAPS: 25.0000 mg | ORAL_CAPSULE | Freq: Four times a day (QID) | ORAL | Status: DC | PRN

## 2019-11-28 MED ORDER — SIMETHICONE 80 MG PO CHEW
80.0000 mg | CHEWABLE_TABLET | ORAL | Status: DC | PRN
  Administered 2019-11-29: 80 mg via ORAL
  Filled 2019-11-28: qty 1

## 2019-11-28 MED ORDER — SENNOSIDES-DOCUSATE SODIUM 8.6-50 MG PO TABS
2.0000 | ORAL_TABLET | ORAL | Status: DC
  Administered 2019-11-29: 2 via ORAL
  Filled 2019-11-28 (×3): qty 2

## 2019-11-28 MED ORDER — LACTATED RINGERS IV SOLN: INTRAVENOUS | Status: DC

## 2019-11-28 MED ORDER — OXYCODONE HCL 5 MG/5ML PO SOLN: 5.0000 mg | Freq: Once | ORAL | Status: DC | PRN

## 2019-11-28 MED ORDER — SODIUM CHLORIDE 0.9 % IR SOLN
Status: DC | PRN
  Administered 2019-11-28: 1

## 2019-11-28 MED ORDER — FENTANYL CITRATE (PF) 100 MCG/2ML IJ SOLN
INTRAMUSCULAR | Status: AC
  Filled 2019-11-28: qty 2

## 2019-11-28 MED ORDER — DEXAMETHASONE SODIUM PHOSPHATE 4 MG/ML IJ SOLN
INTRAMUSCULAR | Status: AC
  Filled 2019-11-28: qty 1

## 2019-11-28 MED ORDER — LABETALOL HCL 5 MG/ML IV SOLN: 40.0000 mg | INTRAVENOUS | Status: DC | PRN

## 2019-11-28 MED ORDER — DEXAMETHASONE SODIUM PHOSPHATE 4 MG/ML IJ SOLN
INTRAMUSCULAR | Status: DC | PRN
  Administered 2019-11-28: 4 mg via INTRAVENOUS

## 2019-11-28 MED ORDER — HYDRALAZINE HCL 20 MG/ML IJ SOLN: 10.0000 mg | INTRAMUSCULAR | Status: DC | PRN

## 2019-11-28 MED ORDER — DIBUCAINE (PERIANAL) 1 % EX OINT: 1.0000 "application " | TOPICAL_OINTMENT | CUTANEOUS | Status: DC | PRN

## 2019-11-28 MED ORDER — OXYCODONE HCL 5 MG PO TABS
5.0000 mg | ORAL_TABLET | ORAL | Status: DC | PRN
  Administered 2019-11-28 – 2019-11-29 (×4): 10 mg via ORAL
  Filled 2019-11-28: qty 2
  Filled 2019-11-28: qty 1
  Filled 2019-11-28 (×3): qty 2

## 2019-11-28 MED ORDER — LIDOCAINE HCL (PF) 2 % IJ SOLN
INTRAMUSCULAR | Status: AC
  Filled 2019-11-28: qty 5

## 2019-11-28 MED ORDER — LACTATED RINGERS IV SOLN: INTRAVENOUS | Status: DC | PRN

## 2019-11-28 MED ORDER — HYDRALAZINE HCL 20 MG/ML IJ SOLN
5.0000 mg | INTRAMUSCULAR | Status: DC | PRN
  Administered 2019-11-28: 5 mg via INTRAVENOUS
  Filled 2019-11-28: qty 1

## 2019-11-28 MED ORDER — BENZOCAINE-MENTHOL 20-0.5 % EX AERO: 1.0000 "application " | INHALATION_SPRAY | CUTANEOUS | Status: DC | PRN

## 2019-11-28 MED ORDER — ONDANSETRON HCL 4 MG/2ML IJ SOLN: 4.0000 mg | INTRAMUSCULAR | Status: DC | PRN

## 2019-11-28 MED ORDER — KETOROLAC TROMETHAMINE 30 MG/ML IJ SOLN
30.0000 mg | Freq: Once | INTRAMUSCULAR | Status: AC
  Administered 2019-11-28: 30 mg via INTRAVENOUS
  Filled 2019-11-28: qty 1

## 2019-11-28 MED ORDER — WITCH HAZEL-GLYCERIN EX PADS: 1.0000 "application " | MEDICATED_PAD | CUTANEOUS | Status: DC | PRN

## 2019-11-28 MED ORDER — ONDANSETRON HCL 4 MG/2ML IJ SOLN
INTRAMUSCULAR | Status: AC
  Filled 2019-11-28: qty 2

## 2019-11-28 MED ORDER — COCONUT OIL OIL: 1.0000 "application " | TOPICAL_OIL | Status: DC | PRN

## 2019-11-28 MED ORDER — LACTATED RINGERS AMNIOINFUSION
INTRAVENOUS | Status: DC
  Filled 2019-11-28 (×2): qty 1000

## 2019-11-28 MED ORDER — HYDROMORPHONE HCL 1 MG/ML IJ SOLN: 1.0000 mg | Freq: Once | INTRAMUSCULAR | Status: DC

## 2019-11-28 MED ORDER — ONDANSETRON HCL 4 MG/2ML IJ SOLN
INTRAMUSCULAR | Status: DC | PRN
  Administered 2019-11-28: 4 mg via INTRAVENOUS

## 2019-11-28 MED ORDER — PRENATAL MULTIVITAMIN CH
1.0000 | ORAL_TABLET | Freq: Every day | ORAL | Status: DC
  Administered 2019-11-29 – 2019-11-30 (×2): 1 via ORAL
  Filled 2019-11-28 (×2): qty 1

## 2019-11-28 MED ORDER — AMLODIPINE BESYLATE 10 MG PO TABS
10.0000 mg | ORAL_TABLET | Freq: Every day | ORAL | Status: DC
  Administered 2019-11-29 – 2019-11-30 (×2): 10 mg via ORAL
  Filled 2019-11-28 (×2): qty 1

## 2019-11-28 MED ORDER — MIDAZOLAM HCL 2 MG/2ML IJ SOLN
INTRAMUSCULAR | Status: AC
  Filled 2019-11-28: qty 2

## 2019-11-28 MED ORDER — MAGNESIUM SULFATE 40 GM/1000ML IV SOLN
2.0000 g/h | INTRAVENOUS | Status: AC
  Administered 2019-11-28 – 2019-11-29 (×2): 2 g/h via INTRAVENOUS
  Filled 2019-11-28 (×2): qty 1000

## 2019-11-28 MED ORDER — AMLODIPINE BESYLATE 5 MG PO TABS
5.0000 mg | ORAL_TABLET | Freq: Every day | ORAL | Status: DC
  Administered 2019-11-28: 5 mg via ORAL
  Filled 2019-11-28: qty 1

## 2019-11-28 MED ORDER — BUPIVACAINE HCL (PF) 0.25 % IJ SOLN
INTRAMUSCULAR | Status: AC
  Filled 2019-11-28: qty 30

## 2019-11-28 MED ORDER — TETANUS-DIPHTH-ACELL PERTUSSIS 5-2.5-18.5 LF-MCG/0.5 IM SUSP: 0.5000 mL | Freq: Once | INTRAMUSCULAR | Status: DC

## 2019-11-28 MED ORDER — OXYCODONE HCL 5 MG PO TABS: 5.0000 mg | ORAL_TABLET | Freq: Once | ORAL | Status: DC | PRN

## 2019-11-28 MED ORDER — EPINEPHRINE PF 1 MG/ML IJ SOLN
INTRAMUSCULAR | Status: AC
  Filled 2019-11-28: qty 1

## 2019-11-28 MED ORDER — FENTANYL CITRATE (PF) 100 MCG/2ML IJ SOLN
INTRAMUSCULAR | Status: DC | PRN
  Administered 2019-11-28 (×2): 25 ug via INTRAVENOUS
  Administered 2019-11-28: 50 ug via INTRAVENOUS

## 2019-11-28 MED ORDER — BUPIVACAINE HCL (PF) 0.25 % IJ SOLN
INTRAMUSCULAR | Status: DC | PRN
  Administered 2019-11-28: 10 mL

## 2019-11-28 MED ORDER — ONDANSETRON HCL 4 MG PO TABS: 4.0000 mg | ORAL_TABLET | ORAL | Status: DC | PRN

## 2019-11-28 MED ORDER — IBUPROFEN 600 MG PO TABS
600.0000 mg | ORAL_TABLET | Freq: Three times a day (TID) | ORAL | Status: DC | PRN
  Administered 2019-11-28 – 2019-11-29 (×3): 600 mg via ORAL
  Filled 2019-11-28 (×3): qty 1

## 2019-11-28 MED ORDER — FENTANYL CITRATE (PF) 100 MCG/2ML IJ SOLN
25.0000 ug | INTRAMUSCULAR | Status: DC | PRN
  Administered 2019-11-28: 50 ug via INTRAVENOUS
  Administered 2019-11-28: 25 ug via INTRAVENOUS

## 2019-11-28 MED ORDER — ONDANSETRON HCL 4 MG/2ML IJ SOLN: 4.0000 mg | Freq: Four times a day (QID) | INTRAMUSCULAR | Status: DC | PRN

## 2019-11-28 MED ORDER — MIDAZOLAM HCL 5 MG/5ML IJ SOLN
INTRAMUSCULAR | Status: DC | PRN
  Administered 2019-11-28 (×2): .5 mg via INTRAVENOUS
  Administered 2019-11-28: 1 mg via INTRAVENOUS

## 2019-11-28 MED ORDER — LABETALOL HCL 5 MG/ML IV SOLN: 20.0000 mg | INTRAVENOUS | Status: DC | PRN

## 2019-11-28 MED ORDER — ACETAMINOPHEN 325 MG PO TABS
650.0000 mg | ORAL_TABLET | Freq: Four times a day (QID) | ORAL | Status: DC | PRN
  Administered 2019-11-28 – 2019-11-30 (×4): 650 mg via ORAL
  Filled 2019-11-28 (×4): qty 2

## 2019-11-28 MED ORDER — MISOPROSTOL 200 MCG PO TABS
ORAL_TABLET | ORAL | Status: AC
  Administered 2019-11-28: 800 ug via RECTAL
  Filled 2019-11-28: qty 4

## 2019-11-28 MED ORDER — MEASLES, MUMPS & RUBELLA VAC IJ SOLR: 0.5000 mL | Freq: Once | INTRAMUSCULAR | Status: DC

## 2019-11-28 MED ORDER — SODIUM BICARBONATE 8.4 % IV SOLN
INTRAVENOUS | Status: DC | PRN
  Administered 2019-11-28: 3 mL via EPIDURAL
  Administered 2019-11-28: 5 mL via EPIDURAL
  Administered 2019-11-28: 3 mL via EPIDURAL
  Administered 2019-11-28: 7 mL via EPIDURAL

## 2019-11-28 MED ORDER — MISOPROSTOL 200 MCG PO TABS: 800.0000 ug | ORAL_TABLET | Freq: Once | ORAL | Status: AC

## 2019-11-28 SURGICAL SUPPLY — 20 items
BLADE SURG 11 STRL SS (BLADE) ×3 IMPLANT
CLIP FILSHIE TUBAL LIGA STRL (Clip) ×3 IMPLANT
CLOTH BEACON ORANGE TIMEOUT ST (SAFETY) ×3 IMPLANT
DRSG OPSITE POSTOP 3X4 (GAUZE/BANDAGES/DRESSINGS) ×3 IMPLANT
DURAPREP 26ML APPLICATOR (WOUND CARE) ×3 IMPLANT
GLOVE BIOGEL PI IND STRL 7.0 (GLOVE) ×3 IMPLANT
GLOVE BIOGEL PI INDICATOR 7.0 (GLOVE) ×6
GLOVE ECLIPSE 7.0 STRL STRAW (GLOVE) ×3 IMPLANT
GOWN STRL REUS W/TWL LRG LVL3 (GOWN DISPOSABLE) ×6 IMPLANT
NEEDLE HYPO 22GX1.5 SAFETY (NEEDLE) ×3 IMPLANT
NS IRRIG 1000ML POUR BTL (IV SOLUTION) ×3 IMPLANT
PACK ABDOMINAL MINOR (CUSTOM PROCEDURE TRAY) ×3 IMPLANT
PROTECTOR NERVE ULNAR (MISCELLANEOUS) ×3 IMPLANT
SPONGE LAP 4X18 RFD (DISPOSABLE) IMPLANT
SUT VICRYL 0 UR6 27IN ABS (SUTURE) ×3 IMPLANT
SUT VICRYL 4-0 PS2 18IN ABS (SUTURE) ×3 IMPLANT
SYR CONTROL 10ML LL (SYRINGE) ×3 IMPLANT
TOWEL OR 17X24 6PK STRL BLUE (TOWEL DISPOSABLE) ×6 IMPLANT
TRAY FOLEY CATH SILVER 14FR (SET/KITS/TRAYS/PACK) ×3 IMPLANT
WATER STERILE IRR 1000ML POUR (IV SOLUTION) ×3 IMPLANT

## 2019-11-28 NOTE — Progress Notes (Signed)
LABOR PROGRESS NOTE  Megan Hunt is a 34 y.o. [id number] at [redacted]w[redacted]d admitted for IOL 2/2 preeclampsia with severe features superimposed on cHTN.  Subjective: Starting to feel intermittent pressure.   Objective: BP 136/85   Pulse 94   Temp 97.8 F (36.6 C) (Oral)   Resp 16   Ht 5\' 4"  (1.626 m)   Wt 102.1 kg   LMP 04/01/2019 (LMP Unknown)   SpO2 98%   BMI 38.64 kg/m  or  Vitals:   11/28/19 0400 11/28/19 0430 11/28/19 0500 11/28/19 0545  BP: 130/70 131/85 136/85   Pulse: 96 89 94   Resp:      Temp:    97.8 F (36.6 C)  TempSrc:    Oral  SpO2:      Weight:      Height:       Dilation: 5 Effacement (%): 60 Cervical Position: Posterior Station: -2 Presentation: Vertex Exam by:: [email or web address], RN FHT: Baseline rate 125, moderate varibility, + accels, intermittent early decels IUPC: q2-19m  Labs: Lab Results  Component Value Date   WBC 14.9 (H) 11/27/2019   HGB 11.4 (L) 11/27/2019   HCT 35.6 (L) 11/27/2019   MCV 82.6 11/27/2019   PLT 279 11/27/2019    Patient Active Problem List   Diagnosis Date Noted  . Encounter for induction of labor 11/27/2019  . Pre-eclampsia superimposed on chronic hypertension, antepartum 11/27/2019  . Supervision of other normal pregnancy, antepartum 06/05/2019  . Sinusitis 07/13/2018  . Preventative health care 11/27/2017  . Anemia 11/27/2017  . Depression affecting pregnancy, antepartum 06/01/2017  . Insomnia 05/15/2016  . Fatigue 08/08/2014  . Hemorrhoids, external 01/05/2011  . Fibromyalgia 01/05/2011  . Constipation 01/03/2010  . Essential hypertension antepartum 09/29/2009  . HYPERCHOLESTEROLEMIA 10/27/2008  . ANXIETY DEPRESSION 10/27/2008  . Allergic rhinitis 10/27/2008  . Asthma, mild intermittent 10/27/2008  . Backache 10/27/2008  . SOMATIC DYSFUNCTION 10/27/2008  . CHEST PAIN, ATYPICAL 10/27/2008  . Migraine 03/29/2007    Assessment / Plan: 34 y.o. 20 at [redacted]w[redacted]d here for IOL 2/2 preeclampsia with severe features  superimposed on cHTN requiring medication (Labetalol).  Labor: Progressing well. S/p FB and Cyto x1. Cont Pit. S/p AROM. IUPC placed right lateral due to lack of cervical change; titrate Pit to adequate MVU's. Anticipate SVD. 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  Pre-eclampsia with SF S/I cHTN: On Mag and anti-hypertensive protocol in addition to home Labetalol 300 mg BID. No symptoms at this time. Mild range pressures currently. Pr:Cr 5.22 but other labs stable. Will continue to monitor.  Fetal Wellbeing: Cat I Pain Control: desires epidural   [redacted]w[redacted]d, MD [facility] Fellow, [facility] for [facility], [facility] 11/28/2019, 5:53 AM

## 2019-11-28 NOTE — Anesthesia Preprocedure Evaluation (Signed)
Anesthesia Evaluation  Patient identified by MRN, date of birth, ID band Patient awake    Reviewed: Allergy & Precautions, H&P , NPO status , Patient's Chart, lab work & pertinent test results  Airway Mallampati: II   Neck ROM: full    Dental   Pulmonary asthma ,    breath sounds clear to auscultation       Cardiovascular hypertension,  Rhythm:regular Rate:Normal     Neuro/Psych  Headaches, PSYCHIATRIC DISORDERS Anxiety Depression  Neuromuscular disease    GI/Hepatic   Endo/Other  obese  Renal/GU      Musculoskeletal  (+) Fibromyalgia -  Abdominal   Peds  Hematology   Anesthesia Other Findings   Reproductive/Obstetrics                             Anesthesia Physical Anesthesia Plan  ASA: II  Anesthesia Plan: Epidural   Post-op Pain Management:    Induction: Intravenous  PONV Risk Score and Plan: 2 and Treatment may vary due to age or medical condition  Airway Management Planned: Simple Face Mask  Additional Equipment:   Intra-op Plan:   Post-operative Plan:   Informed Consent: I have reviewed the patients History and Physical, chart, labs and discussed the procedure including the risks, benefits and alternatives for the proposed anesthesia with the patient or authorized representative who has indicated his/her understanding and acceptance.       Plan Discussed with: CRNA, Anesthesiologist and Surgeon  Anesthesia Plan Comments:         Anesthesia Quick Evaluation

## 2019-11-28 NOTE — Op Note (Signed)
Megan Hunt  11/28/2019  PREOPERATIVE DIAGNOSIS:  Multiparity, undesired fertility  POSTOPERATIVE DIAGNOSIS:  Multiparity, undesired fertility  PROCEDURE:  Postpartum Bilateral Tubal Sterilization using Filshie Clips  ANESTHESIA:  Epidural and local analgesia using 0.25% Marcaine  COMPLICATIONS:  None immediate.  ESTIMATED BLOOD LOSS: 3 ml.  INDICATIONS: 34 y.o. [id number]  with undesired fertility,status post vaginal delivery, desires permanent sterilization.  Other reversible forms of contraception were discussed with patient; she declines all other modalities. Risks of procedure discussed with patient including but not limited to: risk of regret, permanence of method, bleeding, infection, injury to surrounding organs and need for additional procedures.  Failure risk of 0.5-1% with increased risk of ectopic gestation if pregnancy occurs was also discussed with patient.     FINDINGS:  Normal uterus, tubes, and ovaries.  PROCEDURE DETAILS: The patient was taken to the operating room where her spinal anesthesia was dosed up to surgical level and found to be adequate.  She was then placed in a supine position and prepped and draped in the usual sterile fashion.  After an adequate timeout was performed, attention was turned to the patient's abdomen where a small transverse skin incision was made under the umbilical fold. The incision was taken down to the layer of fascia using the scalpel, and fascia was incised, and extended bilaterally. The peritoneum was entered in a sharp fashion. The patient was placed in Trendelenburg.    A moist lap pad was used to move omentum and bowel away until the left fallopian tube was identified and grasped with a Babcock clamp, and followed out to the fimbriated end.    Filshie Clips:  The left Fallopian tube was identified by tracing out to the fimbraie, grasped with the Babcock clamps. An avascular midsection of the tube approximately 3-4cm from the cornua was  grasped with the babcock clamps and the filshie clip was applied, taking care to incorporate the entire tube.  Attention was then turned to the right fallopian tube after confirmation by tracing the tube out to the fimbriae. The same procedure was then performed on the right Fallopian tube, with excellent hemostasis was noted from both BTL sites.   Good hemostasis was noted overall.  The instruments were then removed from the patient's abdomen and the fascial incision was repaired with 0 Vicryl, and the skin was closed with a 4-0 Vicryl subcuticular stitch. Local analgesia was injected into the incision site. The patient tolerated the procedure well.  Sponge, lap, and needle counts were correct times two.  The patient was then taken to the recovery room awake and in stable condition.  Marlowe Alt MD 11/28/2019 1:33 PM

## 2019-11-28 NOTE — Transfer of Care (Signed)
Immediate Anesthesia Transfer of Care Note  Patient: [name]  Procedure(s) Performed: POST PARTUM TUBAL LIGATION (Bilateral )  Patient Location: PACU  Anesthesia Type:Epidural  Level of Consciousness: awake, alert  and oriented  Airway & Oxygen Therapy: Patient Spontanous Breathing  Post-op Assessment: Report given to RN and Post -op Vital signs reviewed and stable  Post vital signs: Reviewed and stable  Last Vitals:  Vitals Value Taken Time  BP 131/71 11/28/19 1341  Temp    Pulse 76 11/28/19 1342  Resp 19 11/28/19 1342  SpO2 100 % 11/28/19 1342  Vitals shown include unvalidated device data.  Last Pain:  Vitals:   11/28/19 1200  TempSrc:   PainSc: 0-No pain      Patients Stated Pain Goal: 2 (11/28/19 0830)  Complications: No complications documented.

## 2019-11-28 NOTE — Anesthesia Postprocedure Evaluation (Signed)
Anesthesia Post Note  Patient: Megan Hunt  Procedure(s) Performed: AN AD HOC LABOR EPIDURAL     Patient location during evaluation: Mother Baby Anesthesia Type: Epidural Level of consciousness: awake and alert Pain management: pain level controlled Vital Signs Assessment: post-procedure vital signs reviewed and stable Respiratory status: spontaneous breathing, nonlabored ventilation and respiratory function stable Cardiovascular status: stable Postop Assessment: no headache, no backache, epidural receding, no apparent nausea or vomiting, patient able to bend at knees, adequate PO intake and able to ambulate Anesthetic complications: no   No complications documented.  Last Vitals:  Vitals:   11/28/19 0800 11/28/19 0830  BP: (!) 154/75 (!) 150/76  Pulse: (!) 107 86  Resp: 15   Temp:    SpO2:  100%    Last Pain:  Vitals:   11/28/19 0715  TempSrc: Oral   Pain Goal:                   Laban Emperor

## 2019-11-28 NOTE — Progress Notes (Signed)
2 severe range pressures noted, pain 8/10 with multiple interventions, called 1st attending Constant, also spoke with Dr. Salomon Mast in department. Hydralazine orders given to start, then go to Labetolol per verbal order.

## 2019-11-28 NOTE — Anesthesia Postprocedure Evaluation (Signed)
Anesthesia Post Note  Patient: Megan Hunt  Procedure(s) Performed: POST PARTUM TUBAL LIGATION (Bilateral )     Patient location during evaluation: PACU Anesthesia Type: Epidural Level of consciousness: oriented and awake and alert Pain management: pain level controlled Vital Signs Assessment: post-procedure vital signs reviewed and stable Respiratory status: spontaneous breathing, respiratory function stable and patient connected to nasal cannula oxygen Cardiovascular status: blood pressure returned to baseline and stable Postop Assessment: no headache, no backache and no apparent nausea or vomiting Anesthetic complications: no   No complications documented.  Last Vitals:  Vitals:   11/28/19 1445 11/28/19 1457  BP: 137/85 (!) 148/83  Pulse: 74 74  Resp: 19 18  Temp:  37 C  SpO2: 100% 99%    Last Pain:  Vitals:   11/28/19 1458  TempSrc:   PainSc: 6    Pain Goal: Patients Stated Pain Goal: 2 (11/28/19 1458)  LLE Motor Response: Purposeful movement (11/28/19 1445) LLE Sensation: Tingling (11/28/19 1445) RLE Motor Response: Purposeful movement (11/28/19 1445) RLE Sensation: Tingling (11/28/19 1445)     Epidural/Spinal Function Cutaneous sensation: Able to Wiggle Toes (11/28/19 1458), Patient able to flex knees: Yes (11/28/19 1458), Patient able to lift hips off bed: Yes (11/28/19 1458), Back pain beyond tenderness at insertion site: No (11/28/19 1458), Progressively worsening motor and/or sensory loss: No (11/28/19 1458), Bowel and/or bladder incontinence post epidural: No (11/28/19 1458)  Elisabeth Strom S

## 2019-11-28 NOTE — Progress Notes (Signed)
Pt for BTL at 1230 today in the OR.  NPO and leave epidural catheter in place.

## 2019-11-28 NOTE — Discharge Summary (Signed)
Postpartum Discharge Summary  Date of Service updated 11/30/2019      Patient Name: Megan Hunt DOB: [birth year] MRN: [id number]  Date of admission: 11/27/2019 Delivery date:11/28/2019  Delivering provider: Chauncey Mann  Date of discharge: 11/30/2019  Admitting diagnosis: Encounter for induction of labor [Z34.90] Intrauterine pregnancy: [redacted]w[redacted]d    Secondary diagnosis:  Active Problems:   Essential hypertension antepartum   Asthma, mild intermittent   Depression affecting pregnancy, antepartum   Encounter for induction of labor   Pre-eclampsia superimposed on chronic hypertension, antepartum  Additional problems: None    Discharge diagnosis: Preterm Pregnancy Delivered, Preeclampsia (severe) and Anemia                                              Post partum procedures:postpartum tubal ligation  11/28/19 Augmentation: AROM, Pitocin, Cytotec and IP Foley Complications: None  Hospital course: Induction of Labor With Vaginal Delivery   34y.o. yo [id number] admitted to the hospital 11/27/2019 for induction of labor.  Indication for induction: severe pre-E.  Patient had an uncomplicated labor course as follows: Initial SVE was 1/thick/ballotable. Patient was given Cytotec x1 with minimal change. A foley bulb was placed at 2030 via sterile speculum exam with repeat dose of Cytotec. Patient progressed to 4.5/50/-3 and Pitocin was started. AROM was performed at 0225 and patient ultimately progressed to 10/100/0 at 0626.  Membrane Rupture Time/Date: 2:25 AM ,11/28/2019   Delivery Method:Vaginal, Spontaneous  Episiotomy: None  Lacerations:  None  Details of delivery can be found in separate delivery note. Magnesium continued for 24 hours PP. Norvasc 10 mg initiated; BP's monitored and controlled. BTL done 7/21.  Patient had a routine postpartum course. Patient is discharged home 11/30/2019   Newborn Data: Birth date:11/28/2019  Birth time:6:29 AM  Gender:Female  Living  status:Living  Apgars:9 ,9  Weight:2460 g   Magnesium Sulfate received: Yes: Seizure prophylaxis BMZ received: Yesx1 Rhophylac:N/A MMR:N/A T-DaP:declined Flu: No Transfusion:No  Physical exam  Vitals:   11/29/19 2104 11/30/19 0456 11/30/19 0517 11/30/19 0801  BP: (!) 140/80 (!) 164/96 (!) 151/86 (!) 139/83  Pulse: 85 71 75 70  Resp:  18  18  Temp:  98 F (36.7 C)  98 F (36.7 C)  TempSrc:  Oral  Oral  SpO2:  99%  98%  Weight:      Height:      Blood pressure (!) 139/83, pulse 70, temperature 98 F (36.7 C), temperature source Oral, resp. rate 18, height '5\' 4"'  (1.626 m), weight 102.1 kg, last menstrual period 04/01/2019, SpO2 98 %, unknown if currently breastfeeding.  General: alert, cooperative and no distress Lochia: appropriate Uterine Fundus: firm Incision: Dressing is clean, dry, and intact DVT Evaluation: No evidence of DVT seen on physical exam. Labs: Lab Results  Component Value Date   WBC 14.9 (H) 11/27/2019   HGB 11.4 (L) 11/27/2019   HCT 35.6 (L) 11/27/2019   MCV 82.6 11/27/2019   PLT 279 11/27/2019   CMP Latest Ref Rng & Units 11/27/2019  Glucose 70 - 99 mg/dL 101(H)  BUN 6 - 20 mg/dL 5(L)  Creatinine 0.44 - 1.00 mg/dL 0.62  Sodium 135 - 145 mmol/L 136  Potassium 3.5 - 5.1 mmol/L 4.6  Chloride 98 - 111 mmol/L 107  CO2 22 - 32 mmol/L 19(L)  Calcium 8.9 - 10.3 mg/dL  8.7(L)  Total Protein 6.5 - 8.1 g/dL 6.8  Total Bilirubin 0.3 - 1.2 mg/dL 0.3  Alkaline Phos 38 - 126 U/L 195(H)  AST 15 - 41 U/L 32  ALT 0 - 44 U/L 12   Edinburgh Score: Edinburgh Postnatal Depression Scale Screening Tool 11/28/2019  I have been able to laugh and see the funny side of things. 0  I have looked forward with enjoyment to things. 0  I have blamed myself unnecessarily when things went wrong. 1  I have been anxious or worried for no good reason. 1  I have felt scared or panicky for no good reason. 0  Things have been getting on top of me. 0  I have been so unhappy that I  have had difficulty sleeping. 0  I have felt sad or miserable. 0  I have been so unhappy that I have been crying. 0  The thought of harming myself has occurred to me. 0  Edinburgh Postnatal Depression Scale Total 2     After visit meds:  Allergies as of 11/30/2019   No Known Allergies     Medication List    STOP taking these medications   Doxylamine-Pyridoxine 10-10 MG Tbec Commonly known as: Diclegis   labetalol 300 MG tablet Commonly known as: NORMODYNE     TAKE these medications   amLODipine 10 MG tablet Commonly known as: NORVASC Take 1 tablet (10 mg total) by mouth daily.   aspirin EC 81 MG tablet Take 81 mg by mouth daily. Swallow whole.   ferrous gluconate 324 MG tablet Commonly known as: FERGON Take 1 tablet (324 mg total) by mouth daily with breakfast.   ibuprofen 600 MG tablet Commonly known as: ADVIL Take 1 tablet (600 mg total) by mouth every 8 (eight) hours as needed for mild pain.   oxyCODONE 5 MG immediate release tablet Commonly known as: Oxy IR/ROXICODONE Take 1-2 tablets (5-10 mg total) by mouth every 6 (six) hours as needed for moderate pain, severe pain or breakthrough pain.        Discharge home in stable condition Infant Feeding: Bottle and Breast Infant Disposition:NICU Discharge instruction: per After Visit Summary and Postpartum booklet. Activity: Advance as tolerated. Pelvic rest for 6 weeks.  Diet: routine diet Future Appointments: Future Appointments  Date Time Provider [place]  12/04/2019  1:30 PM [facility] NURSE [facility]  12/04/2019  1:45 PM [facility] US4 [facility]  12/06/2019  2:30 PM [facility] NURSE [facility] None  01/03/2020  1:00 PM Truett Mainland, DO [facility] None  01/29/2020  9:20 AM Mosie Lukes, MD [facility]   Follow up Visit:  [place] Follow up in 1 week(s).   Specialty: Obstetrics and Gynecology Contact information: [address] [place] [address] [phone]               Please schedule this patient for a In person postpartum visit in 4 weeks with the following provider: Any provider. Additional Postpartum F/U:BP check 1 week  Low risk pregnancy complicated by: HTN Delivery mode:  Vaginal, Spontaneous  Anticipated Birth Control:  BTL done [facility]   11/30/2019 Emeterio Reeve, MD

## 2019-11-28 NOTE — Progress Notes (Addendum)
Postpartum tubal consent:  34 y.o. [id number]  with undesired fertility,status post vaginal delivery, expressed desire for permanent sterilization.  I reviewed with the patient her expressed plan for tubal sterilization.  We dicussed other reversible forms of contraception including LARC options with similar effectiveness of BTS. She expressed that she strongly desires permanent sterilization. She declines all other modalities. We reviewed the possibility of Filshie clip, [place] and salpingectomy based on her anatomy. Patient expressed comfort with all methods.   Risks of procedure discussed with patient including but not limited to: risk of regret, permanence of method, bleeding, infection, injury to surrounding organs and need for additional procedures.  Failure risk of 0.5-1% with increased risk of ectopic gestation if pregnancy occurs was also discussed with patient.  Patient NPO and will remain until procedure. To OR when ready, scheduled for 1245 PM  Federico Flake, MD, MPH, ABFM, [facility] Attending Physician Center for [facility]

## 2019-11-28 NOTE — Progress Notes (Signed)
Called by RN about post BTL pain. Patient arrived on the floor at ~3 PM and reported 10/10 cramping. She received motrin and tylenol with little relief and then received oxycodone.  Per RN the patient's belly is soft but has some mild BP elevation. Patient is reporting a cramping like pain that is constant.   Will given 30mg  Toradol and assess to assure no acute abdomen. BTL case was very straight forward with minimal bleeding.   Will consider IV morphine to help with acute pain if applicable.

## 2019-11-28 NOTE — Progress Notes (Signed)
LABOR PROGRESS NOTE  Megan Hunt is a 34 y.o. [id number] at [redacted]w[redacted]d admitted for IOL 2/2 preeclampsia with severe features superimposed on cHTN.  Subjective: Comfortable with epidural  Objective: BP 134/64   Pulse 97   Temp 98.2 F (36.8 C) (Oral)   Resp 16   Ht 5\' 4"  (1.626 m)   Wt 102.1 kg   LMP 04/01/2019 (LMP Unknown)   SpO2 98%   BMI 38.64 kg/m  or  Vitals:   11/27/19 2356 11/28/19 0001 11/28/19 0101 11/28/19 0131  BP: 128/60 (!) 130/57 129/61 134/64  Pulse: 94 94 96 97  Resp:      Temp:      TempSrc:      SpO2:      Weight:      Height:       Dilation: 5 Effacement (%): 60 Cervical Position: Posterior Station: -2 Presentation: Vertex Exam by:: Lun Muro FHT: Baseline rate 125, moderate varibility, + accels, - decels Toco: difficult to trace currently   Labs: Lab Results  Component Value Date   WBC 14.9 (H) 11/27/2019   HGB 11.4 (L) 11/27/2019   HCT 35.6 (L) 11/27/2019   MCV 82.6 11/27/2019   PLT 279 11/27/2019    Patient Active Problem List   Diagnosis Date Noted  . Encounter for induction of labor 11/27/2019  . Pre-eclampsia superimposed on chronic hypertension, antepartum 11/27/2019  . Supervision of other normal pregnancy, antepartum 06/05/2019  . Sinusitis 07/13/2018  . Preventative health care 11/27/2017  . Anemia 11/27/2017  . Depression affecting pregnancy, antepartum 06/01/2017  . Insomnia 05/15/2016  . Fatigue 08/08/2014  . Hemorrhoids, external 01/05/2011  . Fibromyalgia 01/05/2011  . Constipation 01/03/2010  . Essential hypertension antepartum 09/29/2009  . HYPERCHOLESTEROLEMIA 10/27/2008  . ANXIETY DEPRESSION 10/27/2008  . Allergic rhinitis 10/27/2008  . Asthma, mild intermittent 10/27/2008  . Backache 10/27/2008  . SOMATIC DYSFUNCTION 10/27/2008  . CHEST PAIN, ATYPICAL 10/27/2008  . Migraine 03/29/2007    Assessment / Plan: 34 y.o. 20 at [redacted]w[redacted]d here for IOL 2/2 preeclampsia with severe features superimposed on cHTN requiring  medication (Labetalol).  Labor: Progressing well. S/p FB and Cyto x1. Cont Pit. AROM with moderate amount of clear fluid with this exam. Anticipate SVD. 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  Pre-eclampsia with SF S/I cHTN: On Mag and anti-hypertensive protocol in addition to home Labetalol 300 mg BID. No symptoms at this time. Mild range pressures currently. Pr:Cr 5.22 but other labs stable. Will continue to monitor.  Fetal Wellbeing: Cat 1  Pain Control: desires epidural   [redacted]w[redacted]d, MD [facility] Fellow, [facility] for [facility], [facility] 11/28/2019, 2:30 AM

## 2019-11-29 ENCOUNTER — Encounter (HOSPITAL_COMMUNITY): Payer: Self-pay | Admitting: Obstetrics and Gynecology

## 2019-11-29 MED ORDER — BISACODYL 10 MG RE SUPP
10.0000 mg | Freq: Once | RECTAL | Status: AC
  Administered 2019-11-29: 10 mg via RECTAL
  Filled 2019-11-29: qty 1

## 2019-11-29 NOTE — Progress Notes (Signed)
Post Partum Day 1 Subjective: constipation  Objective: Blood pressure (!) 145/78, pulse 72, temperature 98.6 F (37 C), temperature source Oral, resp. rate 19, height 5\' 4"  (1.626 m), weight 102.1 kg, last menstrual period 04/01/2019, SpO2 98 %, unknown if currently breastfeeding.  Physical Exam:  General: alert, cooperative and no distress Lochia: appropriate Uterine Fundus: firm DVT Evaluation: No evidence of DVT seen on physical exam.  Recent Labs    11/27/19 1456 11/27/19 2216  HGB 11.1* 11.4*  HCT 34.5* 35.6*    Assessment/Plan: Dulcolax Amlodipine for BP Ambulate   LOS: 2 days   11/29/19 11/29/2019, 10:21 AM

## 2019-11-29 NOTE — Lactation Note (Signed)
This note was copied from a baby's chart. Lactation Consultation Note  Patient Name: [name] XIPJA'[id number] Date: 11/29/2019   Initial visit at 30 hours of life. Mom is a P5. This is Mom's 1st time having a baby in the NICU (born at 59 weeks). Mom reports that she breastfed her last child for 6 months.   Crystal, RN had set up Mom with a DEBP & provided colostrum vials & NICU booklet yesterday. To nurse's knowledge, Mom has not been expressing her milk.   I asked Mom if she had any questions about expressing her milk. She said she does not. Mom has not pumped today, but was able to tell me the recommended guidelines for pumping. Mom says she knows how to do hand expression.   Mom has an Avent pump at home. She does not have WIC.     [name] [facility] 11/29/2019, 1:12 PM

## 2019-11-30 ENCOUNTER — Ambulatory Visit: Payer: Self-pay

## 2019-11-30 LAB — SURGICAL PATHOLOGY

## 2019-11-30 MED ORDER — OXYCODONE HCL 5 MG PO TABS: 5.0000 mg | ORAL_TABLET | Freq: Four times a day (QID) | ORAL | 0 refills | Status: DC | PRN

## 2019-11-30 MED ORDER — AMLODIPINE BESYLATE 10 MG PO TABS: 10.0000 mg | ORAL_TABLET | Freq: Every day | ORAL | 1 refills | Status: DC

## 2019-11-30 MED ORDER — IBUPROFEN 600 MG PO TABS: 600.0000 mg | ORAL_TABLET | Freq: Three times a day (TID) | ORAL | 0 refills | Status: DC | PRN

## 2019-11-30 MED FILL — IBUPROFEN 600 MG TABS: 600 | 10 days supply | Qty: 30 | Fill #0

## 2019-11-30 MED FILL — oxyCODONE HCL 5 MG TABS: 5 | 3 days supply | Qty: 15 | Fill #0

## 2019-11-30 MED FILL — AMLODIPINE BESYLATE 10 MG T: 10 | 30 days supply | Qty: 30 | Fill #0

## 2019-11-30 NOTE — Lactation Note (Signed)
This note was copied from a baby's chart. Lactation Consultation Note  Patient Name: Megan Hunt [id number] Date: 11/30/2019   P5 Mom of 34 wk 3d AGA baby at 63 hrs old.   LC in to observe baby in football hold.  LC added support under baby and observed baby latched fairly deeply.  Baby stayed on for 18 mins with occasional swallows.  Recommended that baby receive her full supplementation by NG due to baby being 34 wks and < 6 lbs.    Encouraged Mom to pump after baby breastfeeds to support a full milk supply.  Mom has a Avent DEBP at home.  Mom knows she can use the Medela Symphony DEBP while in room with baby.    Encouraged continued STS as much as possible, offering breast with cues.    [name] 11/30/2019, 5:29 PM

## 2019-11-30 NOTE — Lactation Note (Signed)
This note was copied from a baby's chart. Lactation Consultation Note  Patient Name: Megan Hunt [id number] Date: 11/30/2019 Reason for consult: Follow-up assessment  LC Follow Up Visit:  Attempted to visit with mother, however, she was asleep.  Will return later today.   Maternal Data    Feeding Feeding Type: Formula Nipple Type: Nfant Extra Slow Flow (gold)  LATCH Score                   Interventions    Lactation Tools Discussed/Used     Consult Status Consult Status: Follow-up Date: 12/01/19 Follow-up type: In-patient    Kline Bulthuis R Dario Yono 11/30/2019, 7:58 AM

## 2019-11-30 NOTE — Discharge Instructions (Signed)
Postpartum Hypertension Postpartum hypertension is high blood pressure that remains higher than normal after childbirth. You may not realize that you have postpartum hypertension if your blood pressure is not being checked regularly. In most cases, postpartum hypertension will go away on its own, usually within a week of delivery. However, for some women, medical treatment is required to prevent serious complications, such as seizures or stroke. What are the causes? This condition may be caused by one or more of the following:  Hypertension that existed before pregnancy (chronic hypertension).  Hypertension that comes on as a result of pregnancy (gestational hypertension).  Hypertensive disorders during pregnancy (preeclampsia) or seizures in women who have high blood pressure during pregnancy (eclampsia).  A condition in which the liver, platelets, and red blood cells are damaged during pregnancy (HELLP syndrome).  A condition in which the thyroid produces too much hormones (hyperthyroidism).  Other rare problems of the nerves (neurological disorders) or blood disorders. In some cases, the cause may not be known. What increases the risk? The following factors may make you more likely to develop this condition:  Chronic hypertension. In some cases, this may not have been diagnosed before pregnancy.  Obesity.  Type 2 diabetes.  Kidney disease.  History of preeclampsia or eclampsia.  Other medical conditions that change the level of hormones in the body (hormonal imbalance). What are the signs or symptoms? As with all types of hypertension, postpartum hypertension may not have any symptoms. Depending on how high your blood pressure is, you may experience:  Headaches. These may be mild, moderate, or severe. They may also be steady, constant, or sudden in onset (thunderclap headache).  Changes in your ability to see (visual changes).  Dizziness.  Shortness of breath.  Swelling  of your hands, feet, lower legs, or face. In some cases, you may have swelling in more than one of these locations.  Heart palpitations or a racing heartbeat.  Difficulty breathing while lying down.  Decrease in the amount of urine that you pass. Other rare signs and symptoms may include:  Sweating more than usual. This lasts longer than a few days after delivery.  Chest pain.  Sudden dizziness when you get up from sitting or lying down.  Seizures.  Nausea or vomiting.  Abdominal pain. How is this diagnosed? This condition may be diagnosed based on the results of a physical exam, blood pressure measurements, and blood and urine tests. You may also have other tests, such as a CT scan or an MRI, to check for other problems of postpartum hypertension. How is this treated? If blood pressure is high enough to require treatment, your options may include:  Medicines to reduce blood pressure (antihypertensives). Tell your health care provider if you are breastfeeding or if you plan to breastfeed. There are many antihypertensive medicines that are safe to take while breastfeeding.  Stopping medicines that may be causing hypertension.  Treating medical conditions that are causing hypertension.  Treating the complications of hypertension, such as seizures, stroke, or kidney problems. Your health care provider will also continue to monitor your blood pressure closely until it is within a safe range for you. Follow these instructions at home:  Take over-the-counter and prescription medicines only as told by your health care provider.  Return to your normal activities as told by your health care provider. Ask your health care provider what activities are safe for you.  Do not use any products that contain nicotine or tobacco, such as cigarettes and e-cigarettes. If   you need help quitting, ask your health care provider.  Keep all follow-up visits as told by your health care provider. This  is important. Contact a health care provider if:  Your symptoms get worse.  You have new symptoms, such as: ? A headache that does not get better. ? Dizziness. ? Visual changes. Get help right away if:  You suddenly develop swelling in your hands, ankles, or face.  You have sudden, rapid weight gain.  You develop difficulty breathing, chest pain, racing heartbeat, or heart palpitations.  You develop severe pain in your abdomen.  You have any symptoms of a stroke. "BE FAST" is an easy way to remember the main warning signs of a stroke: ? B - Balance. Signs are dizziness, sudden trouble walking, or loss of balance. ? E - Eyes. Signs are trouble seeing or a sudden change in vision. ? F - Face. Signs are sudden weakness or numbness of the face, or the face or eyelid drooping on one side. ? A - Arms. Signs are weakness or numbness in an arm. This happens suddenly and usually on one side of the body. ? S - Speech. Signs are sudden trouble speaking, slurred speech, or trouble understanding what people say. ? T - Time. Time to call emergency services. Write down what time symptoms started.  You have other signs of a stroke, such as: ? A sudden, severe headache with no known cause. ? Nausea or vomiting. ? Seizure. These symptoms may represent a serious problem that is an emergency. Do not wait to see if the symptoms will go away. Get medical help right away. Call your local emergency services (911 in the [place] Do not drive yourself to the hospital. Summary  Postpartum hypertension is high blood pressure that remains higher than normal after childbirth.  In most cases, postpartum hypertension will go away on its own, usually within a week of delivery.  For some women, medical treatment is required to prevent serious complications, such as seizures or stroke. This information is not intended to replace advice given to you by your health care provider. Make sure you discuss any questions  you have with your health care provider. Document Revised: 06/02/2018 Document Reviewed: 02/14/2017 Elsevier Patient Education  2020 Elsevier Inc.  

## 2019-11-30 NOTE — Lactation Note (Signed)
This note was copied from a baby's chart. Lactation Consultation Note  Patient Name: Megan Hunt Date: 11/30/2019 Reason for consult: Follow-up assessment  P5 mother whose infant is now 45 hours old.  This is a LPTI at 34+1 weeks with a CGA of 34+3 weeks weighing < 6 lbs and in the NICU.    Mother had no questions/concerns related to pumping.  Her breasts/nipples feel fine and she denies pain with pumping.  Mother could not recall how much volume she pumped last time, but, stated that her husband took milk to the NICU.  When I showed her the size of the colostrum containers she stated it was "more than that."  Praised her for her efforts and informed her that she can call for lactation any time she has a questions or concern.  Mother appreciative.  Mother has a DEBP for home use.  No support person present at this time.   Maternal Data    Feeding Feeding Type: Formula Nipple Type: Nfant Extra Slow Flow (gold)  LATCH Score                   Interventions    Lactation Tools Discussed/Used     Consult Status Consult Status: Follow-up Date: 12/01/19 Follow-up type: In-patient    Lyris Hitchman R Alexandre Faries 11/30/2019, 9:04 AM

## 2019-12-01 ENCOUNTER — Encounter (HOSPITAL_COMMUNITY): Payer: Self-pay | Admitting: Family Medicine

## 2019-12-03 ENCOUNTER — Other Ambulatory Visit: Payer: Self-pay | Admitting: Family Medicine

## 2019-12-04 ENCOUNTER — Ambulatory Visit: Payer: 59

## 2019-12-06 ENCOUNTER — Ambulatory Visit: Payer: 59

## 2019-12-06 ENCOUNTER — Encounter (HOSPITAL_COMMUNITY): Payer: Self-pay | Admitting: Obstetrics and Gynecology

## 2019-12-06 ENCOUNTER — Inpatient Hospital Stay (HOSPITAL_COMMUNITY)
Admission: AD | Admit: 2019-12-06 | Discharge: 2019-12-06 | Disposition: A | Discharge status: Home / Self Care | Payer: 59 | Attending: Obstetrics and Gynecology | Admitting: Obstetrics and Gynecology

## 2019-12-06 ENCOUNTER — Other Ambulatory Visit: Payer: Self-pay

## 2019-12-06 VITALS — BP 143/97 | HR 94 | Wt 212.0 lb

## 2019-12-06 DIAGNOSIS — O119 Pre-existing hypertension with pre-eclampsia, unspecified trimester: Secondary | ICD-10-CM

## 2019-12-06 DIAGNOSIS — O115 Pre-existing hypertension with pre-eclampsia, complicating the puerperium: Secondary | ICD-10-CM | POA: Diagnosis not present

## 2019-12-06 DIAGNOSIS — Z7982 Long term (current) use of aspirin: Secondary | ICD-10-CM | POA: Diagnosis not present

## 2019-12-06 DIAGNOSIS — E7800 Pure hypercholesterolemia, unspecified: Secondary | ICD-10-CM | POA: Insufficient documentation

## 2019-12-06 DIAGNOSIS — Z79899 Other long term (current) drug therapy: Secondary | ICD-10-CM | POA: Diagnosis not present

## 2019-12-06 DIAGNOSIS — Z8249 Family history of ischemic heart disease and other diseases of the circulatory system: Secondary | ICD-10-CM | POA: Insufficient documentation

## 2019-12-06 DIAGNOSIS — R519 Headache, unspecified: Secondary | ICD-10-CM

## 2019-12-06 DIAGNOSIS — O1093 Unspecified pre-existing hypertension complicating the puerperium: Secondary | ICD-10-CM | POA: Diagnosis not present

## 2019-12-06 DIAGNOSIS — M797 Fibromyalgia: Secondary | ICD-10-CM | POA: Diagnosis not present

## 2019-12-06 DIAGNOSIS — Z791 Long term (current) use of non-steroidal anti-inflammatories (NSAID): Secondary | ICD-10-CM | POA: Diagnosis not present

## 2019-12-06 DIAGNOSIS — J4520 Mild intermittent asthma, uncomplicated: Secondary | ICD-10-CM | POA: Diagnosis not present

## 2019-12-06 DIAGNOSIS — Z3480 Encounter for supervision of other normal pregnancy, unspecified trimester: Secondary | ICD-10-CM

## 2019-12-06 LAB — COMPREHENSIVE METABOLIC PANEL
ALT: 19 U/L (ref 0–44)
AST: 16 U/L (ref 15–41)
Albumin: 3.1 g/dL — ABNORMAL LOW (ref 3.5–5.0)
Alkaline Phosphatase: 132 U/L — ABNORMAL HIGH (ref 38–126)
Anion gap: 11 (ref 5–15)
BUN: 12 mg/dL (ref 6–20)
CO2: 22 mmol/L (ref 22–32)
Calcium: 8.4 mg/dL — ABNORMAL LOW (ref 8.9–10.3)
Chloride: 106 mmol/L (ref 98–111)
Creatinine, Ser: 0.69 mg/dL (ref 0.44–1.00)
GFR calc Af Amer: >60 mL/min (ref >60)
GFR calc non Af Amer: >60 mL/min (ref >60)
Glucose, Bld: 127 mg/dL — ABNORMAL HIGH (ref 70–99)
Potassium: 3.6 mmol/L (ref 3.5–5.1)
Sodium: 139 mmol/L (ref 135–145)
Total Bilirubin: 0.6 mg/dL (ref 0.3–1.2)
Total Protein: 6.4 g/dL — ABNORMAL LOW (ref 6.5–8.1)

## 2019-12-06 LAB — CBC
HCT: 36 % (ref 36.0–46.0)
Hemoglobin: 11.4 g/dL — ABNORMAL LOW (ref 12.0–15.0)
MCH: 26.8 pg (ref 26.0–34.0)
MCHC: 31.7 g/dL (ref 30.0–36.0)
MCV: 84.7 fL (ref 80.0–100.0)
Platelets: 350 10*3/uL (ref 150–400)
RBC: 4.25 MIL/uL (ref 3.87–5.11)
RDW: 13.1 % (ref 11.5–15.5)
WBC: 12.5 10*3/uL — ABNORMAL HIGH (ref 4.0–10.5)
nRBC: 0 % (ref 0.0–0.2)

## 2019-12-06 LAB — URINALYSIS, ROUTINE W REFLEX MICROSCOPIC
Bilirubin Urine: NEGATIVE
Glucose, UA: NEGATIVE mg/dL
Ketones, ur: NEGATIVE mg/dL
Nitrite: NEGATIVE
Protein, ur: 30 mg/dL — AB
RBC / HPF: >50 RBC/hpf — ABNORMAL HIGH (ref 0–5)
Specific Gravity, Urine: 1.025 (ref 1.005–1.030)
pH: 6 (ref 5.0–8.0)

## 2019-12-06 MED ORDER — KETOROLAC TROMETHAMINE 30 MG/ML IJ SOLN
15.0000 mg | Freq: Once | INTRAMUSCULAR | Status: DC
  Filled 2019-12-06: qty 1

## 2019-12-06 MED ORDER — ACETAMINOPHEN 500 MG PO TABS
1000.0000 mg | ORAL_TABLET | Freq: Once | ORAL | Status: AC
  Administered 2019-12-06: 1000 mg via ORAL
  Filled 2019-12-06: qty 2

## 2019-12-06 MED ORDER — LACTATED RINGERS IV BOLUS
1000.0000 mL | Freq: Once | INTRAVENOUS | Status: AC
  Administered 2019-12-06: 1000 mL via INTRAVENOUS

## 2019-12-06 NOTE — Progress Notes (Addendum)
Patient presents for Blood pressure check (chronic hypertension with superimposed preeclampsia). Patient is 8 days post vaginal delivery. Patient complaining that she still has headache for the last two days. Ibuprofen not helping. Consulted with Dr. Erin Fulling who states that she should proceed to MAU for evaluation. Patient states she will go (Her baby was just discharged from NICU yesterday).   Armandina Stammer RN   Attestation of Attending Supervision of RN: Evaluation and management procedures were performed by the nurse under my supervision and collaboration.  I have reviewed the nursing note and chart, and I agree with the management and plan.  Carolyn L. Harraway-Smith, M.D., [facility]

## 2019-12-06 NOTE — Discharge Instructions (Signed)
-take amlodipine daily -tylenol 1000 mg every 6-8 hours for headaches -ensure you are drinking enough fluids, 64-100oz daily -ensure you are sleeping enough -blood pressure check in clinic in 1 week, they will call you to schedule, if they don't call by Monday call the clinic -return if worsening headaches that don't respond to tylenol or vision changes  Hypertension, Adult Hypertension is another name for high blood pressure. High blood pressure forces your heart to work harder to pump blood. This can cause problems over time. There are two numbers in a blood pressure reading. There is a top number (systolic) over a bottom number (diastolic). It is best to have a blood pressure that is below 120/80. Healthy choices can help lower your blood pressure, or you may need medicine to help lower it. What are the causes? The cause of this condition is not known. Some conditions may be related to high blood pressure. What increases the risk?  Smoking.  Having type 2 diabetes mellitus, high cholesterol, or both.  Not getting enough exercise or physical activity.  Being overweight.  Having too much fat, sugar, calories, or salt (sodium) in your diet.  Drinking too much alcohol.  Having long-term (chronic) kidney disease.  Having a family history of high blood pressure.  Age. Risk increases with age.  Race. You may be at higher risk if you are African American.  Gender. Men are at higher risk than women before age 77. After age 39, women are at higher risk than men.  Having obstructive sleep apnea.  Stress. What are the signs or symptoms?  High blood pressure may not cause symptoms. Very high blood pressure (hypertensive crisis) may cause: ? Headache. ? Feelings of worry or nervousness (anxiety). ? Shortness of breath. ? Nosebleed. ? A feeling of being sick to your stomach (nausea). ? Throwing up (vomiting). ? Changes in how you see. ? Very bad chest pain. ? Seizures. How is  this treated?  This condition is treated by making healthy lifestyle changes, such as: ? Eating healthy foods. ? Exercising more. ? Drinking less alcohol.  Your health care provider may prescribe medicine if lifestyle changes are not enough to get your blood pressure under control, and if: ? Your top number is above 130. ? Your bottom number is above 80.  Your personal target blood pressure may vary. Follow these instructions at home: Eating and drinking   If told, follow the DASH eating plan. To follow this plan: ? Fill one half of your plate at each meal with fruits and vegetables. ? Fill one fourth of your plate at each meal with whole grains. Whole grains include whole-wheat pasta, brown rice, and whole-grain bread. ? Eat or drink low-fat dairy products, such as skim milk or low-fat yogurt. ? Fill one fourth of your plate at each meal with low-fat (lean) proteins. Low-fat proteins include fish, chicken without skin, eggs, beans, and tofu. ? Avoid fatty meat, cured and processed meat, or chicken with skin. ? Avoid pre-made or processed food.  Eat less than 1,500 mg of salt each day.  Do not drink alcohol if: ? Your doctor tells you not to drink. ? You are pregnant, may be pregnant, or are planning to become pregnant.  If you drink alcohol: ? Limit how much you use to:  0-1 drink a day for women.  0-2 drinks a day for men. ? Be aware of how much alcohol is in your drink. In the [place] one drink equals one 12  oz bottle of beer (355 mL), one 5 oz glass of wine (148 mL), or one 1 oz glass of hard liquor (44 mL). Lifestyle   Work with your doctor to stay at a healthy weight or to lose weight. Ask your doctor what the best weight is for you.  Get at least 30 minutes of exercise most days of the week. This may include walking, swimming, or biking.  Get at least 30 minutes of exercise that strengthens your muscles (resistance exercise) at least 3 days a week. This may include  lifting weights or doing Pilates.  Do not use any products that contain nicotine or tobacco, such as cigarettes, e-cigarettes, and chewing tobacco. If you need help quitting, ask your doctor.  Check your blood pressure at home as told by your doctor.  Keep all follow-up visits as told by your doctor. This is important. Medicines  Take over-the-counter and prescription medicines only as told by your doctor. Follow directions carefully.  Do not skip doses of blood pressure medicine. The medicine does not work as well if you skip doses. Skipping doses also puts you at risk for problems.  Ask your doctor about side effects or reactions to medicines that you should watch for. Contact a doctor if you:  Think you are having a reaction to the medicine you are taking.  Have headaches that keep coming back (recurring).  Feel dizzy.  Have swelling in your ankles.  Have trouble with your vision. Get help right away if you:  Get a very bad headache.  Start to feel mixed up (confused).  Feel weak or numb.  Feel faint.  Have very bad pain in your: ? Chest. ? Belly (abdomen).  Throw up more than once.  Have trouble breathing. Summary  Hypertension is another name for high blood pressure.  High blood pressure forces your heart to work harder to pump blood.  For most people, a normal blood pressure is less than 120/80.  Making healthy choices can help lower blood pressure. If your blood pressure does not get lower with healthy choices, you may need to take medicine. This information is not intended to replace advice given to you by your health care provider. Make sure you discuss any questions you have with your health care provider. Document Revised: 01/04/2018 Document Reviewed: 01/04/2018 Elsevier Patient Education  2020 [employer].

## 2019-12-06 NOTE — MAU Provider Note (Signed)
History     [id number]  Arrival date and time: 12/06/19 1722    Chief Complaint  Patient presents with   Hypertension     HPI Megan Hunt is a 34 y.o. PPD#8 s/p IOL at [redacted]w[redacted]d for cHTN with superimposed severe preE with PMHx notable for anxiety/depression, asthma, fibromyalgia, who presents for headaches and elevated BP.  She was discharged 11/30/19 (6 days ago) on amlodipine 10 mg daily. She has been compliant with her medication. She has not been checking her BP, as she has been in the NICU with her baby. She states headache started 3 days ago and has been constant. Her headache did subside last night overnight, but then came back this morning. Dull, holocephalic. Some associated floaters, not currently She has taken ibuprofen 600 mg without relief of symptoms. Denies cp, sob, palpitations. No n/v/d/c. No urinary symptoms. No fever/chills. Vaginal bleeding appropriate. Some abdominal cramping after BTL, but improving.    Review of discharge summary from last admission on 11/30/19 and all lab work and notes reviewed    --/--/A POS (07/20 1512)  OB History as of 11/21/2019    Gravida  5   Para  4   Term  4   Preterm      AB      Living  4     SAB      TAB      Ectopic      Multiple  0   Live Births  4           Past Medical History:  Diagnosis Date   Allergic rhinitis    Anemia    Anxiety and depression    Asthma, mild intermittent 10/27/2008   Qualifier: Diagnosis of  By: Kriste Basque MD, Lonzo Cloud    Atypical chest pain    Back pain    Constipation    History of migraines    Hypercholesteremia    Hypertension    no longer taking meds   Insomnia 05/15/2016   Somatic dysfunction     Past Surgical History:  Procedure Laterality Date   TUBAL LIGATION Bilateral 11/28/2019   Procedure: POST PARTUM TUBAL LIGATION;  Surgeon: Federico Flake, MD;  Location: [facility] ORS;  Service: Gynecology;  Laterality: Bilateral;   WISDOM TOOTH EXTRACTION       Family History  Problem Relation Age of Onset   Hypertension Father    Hypertension Mother    Arthritis Brother    Stroke Maternal Grandfather    Hypertension Maternal Grandfather    Cancer Sister    Birth defects Paternal Aunt     Social History   Socioeconomic History   Marital status: Married    Spouse name: Not on file   Number of children: 2   Years of education: Not on file   Highest education level: Not on file  Occupational History   Occupation: [employer]    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    Comment: grad [employer] degree in social work  Tobacco Use   Smoking status: Never Smoker   Smokeless tobacco: Never Used  Building services engineer Use: Never used  Substance and Sexual Activity   Alcohol use: No   Drug use: No   Sexual activity: Not Currently    Birth control/protection: Surgical    Comment: lives with husband and 3 small children, no dietary restrictions, stays at home  Other Topics Concern   Not on file  Social History Narrative  Son [place]azilandjordan born 02/2008 and daughter Penne Lashjamaya born 06/2009   Ivin BootyJoshua born 03/22/2013   Social Determinants of Health   Financial Resource Strain:    Difficulty of Paying Living Expenses:   Food Insecurity:    Worried About Programme researcher, broadcasting/film/videounning Out of Food in the Last Year:    Baristaan Out of Food in the Last Year:   Transportation Needs:    Freight forwarderLack of Transportation (Medical):    Lack of Transportation (Non-Medical):   Physical Activity:    Days of Exercise per Week:    Minutes of Exercise per Session:   Stress:    Feeling of Stress :   Social Connections:    Frequency of Communication with Friends and Family:    Frequency of Social Gatherings with Friends and Family:    Attends Religious Services:    Active Member of Clubs or Organizations:    Attends Engineer, structuralClub or Organization Meetings:    Marital Status:   Intimate Partner Violence:    Fear of Current or Ex-Partner:    Emotionally Abused:     Physically Abused:    Sexually Abused:     No Known Allergies  No current facility-administered medications on file prior to encounter.   Current Outpatient Medications on File Prior to Encounter  Medication Sig Dispense Refill   amLODipine (NORVASC) 10 MG tablet Take 1 tablet (10 mg total) by mouth daily. 20 tablet 1   ibuprofen (ADVIL) 600 MG tablet Take 1 tablet (600 mg total) by mouth every 8 (eight) hours as needed for mild pain. 30 tablet 0   aspirin EC 81 MG tablet Take 81 mg by mouth daily. Swallow whole. (Patient not taking: Reported on 12/06/2019)     ferrous gluconate (FERGON) 324 MG tablet Take 1 tablet (324 mg total) by mouth daily with breakfast. 90 tablet 3   oxyCODONE (OXY IR/ROXICODONE) 5 MG immediate release tablet Take 1-2 tablets (5-10 mg total) by mouth every 6 (six) hours as needed for moderate pain, severe pain or breakthrough pain. 15 tablet 0     ROS Pertinent positives and negative per HPI, all others reviewed and negative  Physical Exam   BP (!) 156/87    Pulse 83    Temp 98.2 F (36.8 C) (Oral)    Resp 17    LMP 04/01/2019 (LMP Unknown)    SpO2 98%   Physical Exam Vitals and nursing note reviewed. Exam conducted with a chaperone present.  Constitutional:      General: She is not in acute distress.    Appearance: Normal appearance. She is normal weight.  HENT:     Head: Normocephalic and atraumatic.     Nose: Nose normal.     Mouth/Throat:     Mouth: Mucous membranes are moist.     Pharynx: Oropharynx is clear.  Eyes:     Extraocular Movements: Extraocular movements intact.     Conjunctiva/sclera: Conjunctivae normal.  Cardiovascular:     Rate and Rhythm: Normal rate.     Pulses: Normal pulses.  Pulmonary:     Effort: Pulmonary effort is normal.  Abdominal:     General: Abdomen is flat.     Tenderness: There is no abdominal tenderness. There is no guarding or rebound.  Musculoskeletal:        General: Normal range of motion.      Cervical back: Normal range of motion and neck supple.  Skin:    General: Skin is warm and dry.  Neurological:  General: No focal deficit present.     Mental Status: She is alert and oriented to person, place, and time. Mental status is at baseline.     Cranial Nerves: No cranial nerve deficit.     Motor: No weakness.  Psychiatric:        Mood and Affect: Mood normal.        Behavior: Behavior normal.     Cervical Exam  not indicated  Bedside Ultrasound Not indicated  My interpretation: n/a  FHT Not indicated  Labs Results for orders placed or performed during the hospital encounter of 12/06/19 (from the past 24 hour(s))  Urinalysis, Routine w reflex microscopic     Status: Abnormal   Collection Time: 12/06/19  5:34 PM  Result Value Ref Range   Color, Urine YELLOW YELLOW   APPearance HAZY (A) CLEAR   Specific Gravity, Urine 1.025 1.005 - 1.030   pH 6.0 5.0 - 8.0   Glucose, UA NEGATIVE NEGATIVE mg/dL   Hgb urine dipstick LARGE (A) NEGATIVE   Bilirubin Urine NEGATIVE NEGATIVE   Ketones, ur NEGATIVE NEGATIVE mg/dL   Protein, ur 30 (A) NEGATIVE mg/dL   Nitrite NEGATIVE NEGATIVE   Leukocytes,Ua MODERATE (A) NEGATIVE   RBC / HPF >50 (H) 0 - 5 RBC/hpf   WBC, UA 6-10 0 - 5 WBC/hpf   Bacteria, UA RARE (A) NONE SEEN   Squamous Epithelial / LPF 0-5 0 - 5   Mucus PRESENT    Amorphous Crystal PRESENT   CBC     Status: Abnormal   Collection Time: 12/06/19  5:45 PM  Result Value Ref Range   WBC 12.5 (H) 4.0 - 10.5 K/uL   RBC 4.25 3.87 - 5.11 MIL/uL   Hemoglobin 11.4 (L) 12.0 - 15.0 g/dL   HCT 85.2 36 - 46 %   MCV 84.7 80.0 - 100.0 fL   MCH 26.8 26.0 - 34.0 pg   MCHC 31.7 30.0 - 36.0 g/dL   RDW 77.8 24.2 - 35.3 %   Platelets 350 150 - 400 K/uL   nRBC 0.0 0.0 - 0.2 %  Comprehensive metabolic panel     Status: Abnormal   Collection Time: 12/06/19  5:45 PM  Result Value Ref Range   Sodium 139 135 - 145 mmol/L   Potassium 3.6 3.5 - 5.1 mmol/L   Chloride 106 98 - 111  mmol/L   CO2 22 22 - 32 mmol/L   Glucose, Bld 127 (H) 70 - 99 mg/dL   BUN 12 6 - 20 mg/dL   Creatinine, Ser 6.14 0.44 - 1.00 mg/dL   Calcium 8.4 (L) 8.9 - 10.3 mg/dL   Total Protein 6.4 (L) 6.5 - 8.1 g/dL   Albumin 3.1 (L) 3.5 - 5.0 g/dL   AST 16 15 - 41 U/L   ALT 19 0 - 44 U/L   Alkaline Phosphatase 132 (H) 38 - 126 U/L   Total Bilirubin 0.6 0.3 - 1.2 mg/dL   GFR calc non Af Amer >60 >60 mL/min   GFR calc Af Amer >60 >60 mL/min   Anion gap 11 5 - 15    Imaging No results found.  MAU Course  Procedures  Lab Orders     Urinalysis, Routine w reflex microscopic     CBC     Comprehensive metabolic panel Meds ordered this encounter  Medications   lactated ringers bolus 1,000 mL   acetaminophen (TYLENOL) tablet 1,000 mg   ketorolac (TORADOL) 30 MG/ML injection 15 mg  Imaging Orders  No imaging studies ordered today    MDM moderate  Assessment and Plan  33yo [id number] PPD#8 s/p uncomplicated SVD with pregnancy complicated by cHTN with severe preE requiring magnesium.  #cHTN w/ superimposed preE w/ SF Patient presents with headache for 3 days, with some associated vision changes, unrelieved with ibuprofen at home. She has not been checking her blood pressure since discharge and has been compliant with amlodipine 10 mg daily as prescribed at discharge. She was sent over from clinic for further evaluation today. BP elevated upon presentation to 153/87. Labs unremarkable. Headache treated with tylenol and LR, with relief of symptoms. Suspect headache is likely due to dehydration vs fatigue and unrelated to her hypertension. Given strict return precautions.  -BP check in 1 week at [facility], message sent to schedulers -Encouraged increased hydration -Encouraged to keep home BP log -tylenol 1000 mg every 6-8 hours as needed for headaches    Rozann Lesches, MD OB Fellow, Faculty Practice [facility], Center for [facility] 12/06/2019 7:49 PM

## 2019-12-06 NOTE — MAU Note (Signed)
.   Megan Hunt is a 34 y.o. postpartum vaginal delivery (11/28/19) here in MAU reporting: Elevated BP in the office accompanied by headache, blurry vision, and floaters that come and go. She states that she took motrin yesterday and it did not help with her headache. She took Norvasc 10mg  at 1400 this evening.   Pain score: 6 Vitals:   12/06/19 1736  BP: (!) 153/87  Pulse: 99  Resp: 17  Temp: 98.2 F (36.8 C)  SpO2: 99%      Lab orders placed from triage: UA

## 2019-12-11 ENCOUNTER — Encounter: Payer: 59 | Admitting: Advanced Practice Midwife

## 2019-12-13 ENCOUNTER — Other Ambulatory Visit: Payer: Self-pay

## 2019-12-13 ENCOUNTER — Ambulatory Visit: Payer: 59

## 2019-12-13 NOTE — Progress Notes (Signed)
Chart reviewed - agree with CMA/RN documentation.  ° °

## 2019-12-13 NOTE — Progress Notes (Signed)
Patient presents for BP check. Patient states she is feeling much better than last week (bp check) Patient states she is taking her amolodpine. 10 mg. Armandina Stammer RN

## 2019-12-19 ENCOUNTER — Encounter: Payer: 59 | Admitting: Obstetrics & Gynecology

## 2020-01-03 ENCOUNTER — Ambulatory Visit (INDEPENDENT_AMBULATORY_CARE_PROVIDER_SITE_OTHER): Payer: 59 | Admitting: Family Medicine

## 2020-01-03 ENCOUNTER — Other Ambulatory Visit: Payer: Self-pay

## 2020-01-03 ENCOUNTER — Encounter: Payer: Self-pay | Admitting: Family Medicine

## 2020-01-03 DIAGNOSIS — O119 Pre-existing hypertension with pre-eclampsia, unspecified trimester: Secondary | ICD-10-CM

## 2020-01-03 MED ORDER — AMLODIPINE BESYLATE 10 MG PO TABS: 10.0000 mg | ORAL_TABLET | Freq: Every day | ORAL | 0 refills | Status: DC

## 2020-01-03 NOTE — Progress Notes (Signed)
    Post Partum Visit Note  Megan Hunt is a 34 y.o. [id number] female who presents for a postpartum visit. She is 5 weeks postpartum following a normal spontaneous vaginal delivery.  I have fully reviewed the prenatal and intrapartum course. The delivery was at 34 gestational weeks.  Anesthesia: epidural. Postpartum course has been normal. Baby is doing well. Baby is feeding by breast. Bleeding staining only. Bowel function is normal. Bladder function is normal. Patient is not sexually active. Contraception method is tubal ligation. Postpartum depression screening: negative.   The following portions of the patient's history were reviewed and updated as appropriate: allergies, current medications, past family history, past medical history, past social history, past surgical history and problem list.  Review of Systems Pertinent items are noted in HPI.    Objective:  unknown if currently breastfeeding.  General:  alert, cooperative and no distress  Lungs: clear to auscultation bilaterally  Heart:  regular rate and rhythm, S1, S2 normal, no murmur, click, rub or gallop  Abdomen: soft, non-tender; bowel sounds normal; no masses,  no organomegaly        Assessment:    Normal postpartum exam. Pap smear not done at today's visit.   Plan:   Essential components of care per ACOG recommendations:  1.  Mood and well being: Patient with negative depression screening today. Reviewed local resources for support.   2. Infant care and feeding:  -Patient currently breastmilk feeding? Yes  -Social determinants of health (SDOH) reviewed in EPIC. No concerns  3. Sexuality, contraception and birth spacing - Patient does not want a pregnancy in the next year.    4. Sleep and fatigue -Encouraged family/partner/community support of 4 hrs of uninterrupted sleep to help with mood and fatigue  5. Physical Recovery  - Discussed patients delivery and complications - Patient has urinary incontinence?  No - Patient is safe to resume physical and sexual activity  6.  Health Maintenance - Last pap smear done 05/2019 and was normal with negative HPV.  7. Chronic Disease - HTN - PCP follow up  chiquita l wilson, CMA Center for [employer], [facility]

## 2020-01-29 ENCOUNTER — Ambulatory Visit (INDEPENDENT_AMBULATORY_CARE_PROVIDER_SITE_OTHER): Payer: 59 | Admitting: Family Medicine

## 2020-01-29 ENCOUNTER — Other Ambulatory Visit: Payer: Self-pay

## 2020-01-29 VITALS — BP 121/82 | HR 78 | Temp 98.0°F | Resp 13 | Ht 64.0 in | Wt 214.4 lb

## 2020-01-29 DIAGNOSIS — R739 Hyperglycemia, unspecified: Secondary | ICD-10-CM

## 2020-01-29 DIAGNOSIS — O10019 Pre-existing essential hypertension complicating pregnancy, unspecified trimester: Secondary | ICD-10-CM

## 2020-01-29 DIAGNOSIS — E7800 Pure hypercholesterolemia, unspecified: Secondary | ICD-10-CM | POA: Diagnosis not present

## 2020-01-29 DIAGNOSIS — F341 Dysthymic disorder: Secondary | ICD-10-CM

## 2020-01-29 DIAGNOSIS — G4700 Insomnia, unspecified: Secondary | ICD-10-CM

## 2020-01-29 DIAGNOSIS — E559 Vitamin D deficiency, unspecified: Secondary | ICD-10-CM

## 2020-01-29 NOTE — Patient Instructions (Signed)

## 2020-01-30 DIAGNOSIS — R739 Hyperglycemia, unspecified: Secondary | ICD-10-CM | POA: Insufficient documentation

## 2020-01-30 DIAGNOSIS — E559 Vitamin D deficiency, unspecified: Secondary | ICD-10-CM | POA: Insufficient documentation

## 2020-01-30 LAB — COMPREHENSIVE METABOLIC PANEL WITH GFR
AG Ratio: 1.5 (calc) (ref 1.0–2.5)
ALT: 18 U/L (ref 6–29)
AST: 17 U/L (ref 10–30)
Albumin: 4.3 g/dL (ref 3.6–5.1)
Alkaline phosphatase (APISO): 170 U/L — ABNORMAL HIGH (ref 31–125)
BUN: 11 mg/dL (ref 7–25)
CO2: 29 mmol/L (ref 20–32)
Calcium: 9.3 mg/dL (ref 8.6–10.2)
Chloride: 104 mmol/L (ref 98–110)
Creat: 0.73 mg/dL (ref 0.50–1.10)
Globulin: 2.9 g/dL (ref 1.9–3.7)
Glucose, Bld: 86 mg/dL (ref 65–99)
Potassium: 4 mmol/L (ref 3.5–5.3)
Sodium: 141 mmol/L (ref 135–146)
Total Bilirubin: 0.4 mg/dL (ref 0.2–1.2)
Total Protein: 7.2 g/dL (ref 6.1–8.1)

## 2020-01-30 LAB — CBC WITH DIFFERENTIAL/PLATELET
Absolute Monocytes: 762 cells/uL (ref 200–950)
Basophils Absolute: 39 cells/uL (ref 0–200)
Basophils Relative: 0.5 %
Eosinophils Absolute: 108 cells/uL (ref 15–500)
Eosinophils Relative: 1.4 %
HCT: 37.2 % (ref 35.0–45.0)
Hemoglobin: 12.1 g/dL (ref 11.7–15.5)
Lymphs Abs: 2248 cells/uL (ref 850–3900)
MCH: 26.5 pg — ABNORMAL LOW (ref 27.0–33.0)
MCHC: 32.5 g/dL (ref 32.0–36.0)
MCV: 81.6 fL (ref 80.0–100.0)
MPV: 10.8 fL (ref 7.5–12.5)
Monocytes Relative: 9.9 %
Neutro Abs: 4543 cells/uL (ref 1500–7800)
Neutrophils Relative %: 59 %
Platelets: 317 10*3/uL (ref 140–400)
RBC: 4.56 10*6/uL (ref 3.80–5.10)
RDW: 13.6 % (ref 11.0–15.0)
Total Lymphocyte: 29.2 %
WBC: 7.7 10*3/uL (ref 3.8–10.8)

## 2020-01-30 LAB — HEMOGLOBIN A1C
Hgb A1c MFr Bld: 5.9 %{Hb} — ABNORMAL HIGH
Mean Plasma Glucose: 123 (calc)
eAG (mmol/L): 6.8 (calc)

## 2020-01-30 LAB — LIPID PANEL
Cholesterol: 204 mg/dL — ABNORMAL HIGH (ref <200)
HDL: 58 mg/dL (ref >=50)
LDL Cholesterol (Calc): 113 mg/dL (calc) — ABNORMAL HIGH
Non-HDL Cholesterol (Calc): 146 mg/dL (calc) — ABNORMAL HIGH (ref <130)
Total CHOL/HDL Ratio: 3.5 (calc) (ref <5.0)
Triglycerides: 210 mg/dL — ABNORMAL HIGH (ref <150)

## 2020-01-30 LAB — TSH: TSH: 0.82 m[IU]/L

## 2020-01-30 LAB — VITAMIN D 25 HYDROXY (VIT D DEFICIENCY, FRACTURES): Vit D, 25-Hydroxy: 12 ng/mL — ABNORMAL LOW (ref 30–100)

## 2020-01-30 MED ORDER — VITAMIN D (ERGOCALCIFEROL) 1.25 MG (50000 UNIT) PO CAPS: 50000.0000 [IU] | ORAL_CAPSULE | ORAL | 4 refills | Status: DC

## 2020-01-30 NOTE — Assessment & Plan Note (Signed)
Supplement and monitor 

## 2020-01-30 NOTE — Assessment & Plan Note (Signed)
Well controlled, no changes to meds. Encouraged heart healthy diet such as the DASH diet and exercise as tolerated.  °

## 2020-01-30 NOTE — Assessment & Plan Note (Signed)
Labs reveal deficiency. Start on Vitamin D 50000 IU caps, 1 cap po weekly x 12 weeks. Disp #4 with 4 rf. Also take daily Vitamin D over the counter. If already taking a daily supplement increase by 1000 IU daily and if not start Vitamin D 2000 IU daily.  

## 2020-01-30 NOTE — Assessment & Plan Note (Signed)
Has just had her 5th child and despite lack of sleep she is doing well. Patient has been hesitant to use medications and does not feel she needs them now

## 2020-01-30 NOTE — Assessment & Plan Note (Signed)
Encouraged heart healthy diet, increase exercise, avoid trans fats, consider a krill oil cap daily 

## 2020-01-30 NOTE — Assessment & Plan Note (Signed)
hgba1c acceptable, minim.sbdize simple carbs. Increase exercise as tolerated.

## 2020-01-30 NOTE — Progress Notes (Signed)
Subjective:    Patient ID: Megan Hunt, female    DOB: 1986/03/28, 34 y.o.   MRN: [id number]  Chief Complaint  Patient presents with  . 6 months    HPI Patient is in today for follow up on chronic medical concerns. No recent febrile illness or hospitalization. Megan Hunt had Megan Hunt 5 th child in July and is managing well. Is getting more sleep now but stilln otes some fatigue. No other acute concerns. Denies CP/palp/SOB/HA/congestion/fevers/GI or GU c/o. Taking meds as prescribed  Past Medical History:  Diagnosis Date  . Allergic rhinitis   . Anemia   . Anxiety and depression   . Asthma, mild intermittent 10/27/2008   Qualifier: Diagnosis of  By: Kriste Basque MD, Lonzo Cloud   . Atypical chest pain   . Back pain   . Constipation   . History of migraines   . Hypercholesteremia   . Hypertension    no longer taking meds  . Insomnia 05/15/2016  . Somatic dysfunction     Past Surgical History:  Procedure Laterality Date  . TUBAL LIGATION Bilateral 11/28/2019   Procedure: POST PARTUM TUBAL LIGATION;  Surgeon: Federico Flake, MD;  Location: [facility];  Service: Gynecology;  Laterality: Bilateral;  . WISDOM TOOTH EXTRACTION      Family History  Problem Relation Age of Onset  . Hypertension Father   . Hypertension Mother   . Arthritis Brother   . Stroke Maternal Grandfather   . Hypertension Maternal Grandfather   . Cancer Sister   . Birth defects Paternal Aunt     Social History   Socioeconomic History  . Marital status: Married    Spouse name: Not on file  . Number of children: 2  . Years of education: Not on file  . Highest education level: Not on file  Occupational History  . 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 Smoking status: Never Smoker  . Smokeless tobacco: Never Used  Vaping Use  . Vaping Use: Never used  Substance and Sexual Activity  . Alcohol use: No  . Drug use: No    . Sexual activity: Not Currently    Birth control/protection: Surgical    Comment: lives with husband and 3 small children, no dietary restrictions, stays at home  Other Topics Concern  . Not on file  Social History Narrative   Son [name] born 02/2008 and daughter [name] born 06/2009   Ivin Booty born 03/22/2013   Social Determinants of Health   Financial Resource Strain:   . Difficulty of Paying Living Expenses: Not on file  Food Insecurity:   . Worried About Programme researcher, broadcasting/film/video in the Last Year: Not on file  . Ran Out of Food in the Last Year: Not on file  Transportation Needs:   . Lack of Transportation (Medical): Not on file  . Lack of Transportation (Non-Medical): Not on file  Physical Activity:   . Days of Exercise per Week: Not on file  . Minutes of Exercise per Session: Not on file  Stress:   . Feeling of Stress : Not on file  Social Connections:   . Frequency of Communication with Friends and Family: Not on file  . Frequency of Social Gatherings with Friends and Family: Not on file  . Attends Religious Services: Not on file  . Active Member of Clubs or Organizations: Not on file  .  Attends Banker Meetings: Not on file  . Marital Status: Not on file  Intimate Partner Violence:   . Fear of Current or Ex-Partner: Not on file  . Emotionally Abused: Not on file  . Physically Abused: Not on file  . Sexually Abused: Not on file    Outpatient Medications Prior to Visit  Medication Sig Dispense Refill  . amLODipine (NORVASC) 10 MG tablet Take 1 tablet (10 mg total) by mouth daily. 90 tablet 0   No facility-administered medications prior to visit.    No Known Allergies  Review of Systems  Constitutional: Positive for malaise/fatigue. Negative for fever.  HENT: Negative for congestion.   Eyes: Negative for blurred vision.  Respiratory: Negative for shortness of breath.   Cardiovascular: Negative for chest pain, palpitations and leg swelling.   Gastrointestinal: Negative for abdominal pain, blood in stool and nausea.  Genitourinary: Negative for dysuria and frequency.  Musculoskeletal: Negative for falls.  Skin: Negative for rash.  Neurological: Negative for dizziness, loss of consciousness and headaches.  Endo/Heme/Allergies: Negative for environmental allergies.  Psychiatric/Behavioral: Negative for depression. The patient is not nervous/anxious.        Objective:    Physical Exam Vitals and nursing note reviewed.  Constitutional:      General: Megan Hunt is not in acute distress.    Appearance: Megan Hunt is well-developed.  HENT:     Head: Normocephalic and atraumatic.     Nose: Nose normal.  Eyes:     General:        Right eye: No discharge.        Left eye: No discharge.  Cardiovascular:     Rate and Rhythm: Normal rate and regular rhythm.     Heart sounds: No murmur heard.   Pulmonary:     Effort: Pulmonary effort is normal.     Breath sounds: Normal breath sounds.  Abdominal:     General: Bowel sounds are normal.     Palpations: Abdomen is soft.     Tenderness: There is no abdominal tenderness.  Musculoskeletal:     Cervical back: Normal range of motion and neck supple.  Skin:    General: Skin is warm and dry.  Neurological:     Mental Status: Megan Hunt is alert and oriented to person, place, and time.     BP 121/82 (BP Location: Right Arm, Patient Position: Sitting, Cuff Size: Large)   Pulse 78   Temp 98 F (36.7 C) (Oral)   Resp 13   Ht 5\' 4"  (1.626 m)   Wt 214 lb 6.4 oz (97.3 kg)   SpO2 97%   BMI 36.80 kg/m  Wt Readings from Last 3 Encounters:  01/29/20 214 lb 6.4 oz (97.3 kg)  01/03/20 210 lb (95.3 kg)  12/06/19 (!) 212 lb (96.2 kg)    Diabetic Foot Exam - Simple   No data filed     Lab Results  Component Value Date   WBC 7.7 01/29/2020   HGB 12.1 01/29/2020   HCT 37.2 01/29/2020   PLT 317 01/29/2020   GLUCOSE 86 01/29/2020   CHOL 204 (H) 01/29/2020   TRIG 210 (H) 01/29/2020   HDL 58  01/29/2020   LDLDIRECT 177.4 06/15/2013   LDLCALC 113 (H) 01/29/2020   ALT 18 01/29/2020   AST 17 01/29/2020   NA 141 01/29/2020   K 4.0 01/29/2020   CL 104 01/29/2020   CREATININE 0.73 01/29/2020   BUN 11 01/29/2020   CO2 29 01/29/2020  TSH 0.82 01/29/2020   HGBA1C 5.9 (H) 01/29/2020    Lab Results  Component Value Date   TSH 0.82 01/29/2020   Lab Results  Component Value Date   WBC 7.7 01/29/2020   HGB 12.1 01/29/2020   HCT 37.2 01/29/2020   MCV 81.6 01/29/2020   PLT 317 01/29/2020   Lab Results  Component Value Date   NA 141 01/29/2020   K 4.0 01/29/2020   CO2 29 01/29/2020   GLUCOSE 86 01/29/2020   BUN 11 01/29/2020   CREATININE 0.73 01/29/2020   BILITOT 0.4 01/29/2020   ALKPHOS 132 (H) 12/06/2019   AST 17 01/29/2020   ALT 18 01/29/2020   PROT 7.2 01/29/2020   ALBUMIN 3.1 (L) 12/06/2019   CALCIUM 9.3 01/29/2020   ANIONGAP 11 12/06/2019   GFR 96.75 12/05/2018   Lab Results  Component Value Date   CHOL 204 (H) 01/29/2020   Lab Results  Component Value Date   HDL 58 01/29/2020   Lab Results  Component Value Date   LDLCALC 113 (H) 01/29/2020   Lab Results  Component Value Date   TRIG 210 (H) 01/29/2020   Lab Results  Component Value Date   CHOLHDL 3.5 01/29/2020   Lab Results  Component Value Date   HGBA1C 5.9 (H) 01/29/2020       Assessment & Plan:   Problem List Items Addressed This Visit    HYPERCHOLESTEROLEMIA    Encouraged heart healthy diet, increase exercise, avoid trans fats, consider a krill oil cap daily      Relevant Orders   Lipid panel (Completed)   ANXIETY DEPRESSION    Has just had Megan Hunt 5th child and despite lack of sleep Megan Hunt is doing well. Patient has been hesitant to use medications and does not feel Megan Hunt needs them now      Essential hypertension antepartum - Primary    Well controlled, no changes to meds. Encouraged heart healthy diet such as the DASH diet and exercise as tolerated.       Relevant Orders   CBC  with Differential/Platelet (Completed)   Comprehensive metabolic panel (Completed)   TSH (Completed)   Insomnia   Hyperglycemia    hgba1c acceptable, minim.sbdize simple carbs. Increase exercise as tolerated.       Relevant Orders   Hemoglobin A1c (Completed)   Hypocalcemia    Supplement and monitor      Relevant Orders   VITAMIN D 25 Hydroxy (Vit-D Deficiency, Fractures) (Completed)   Vitamin D deficiency    Labs reveal deficiency. Start on Vitamin D 66440 IU caps, 1 cap po weekly x 12 weeks. Disp #4 with 4 rf. Also take daily Vitamin D over the counter. If already taking a daily supplement increase by 1000 IU daily and if not start Vitamin D 2000 IU daily.          I am having Ysabel M. Longsworth start on Vitamin D (Ergocalciferol). I am also having Megan Hunt maintain Megan Hunt amLODipine.  Meds ordered this encounter  Medications  . Vitamin D, Ergocalciferol, (DRISDOL) 1.25 MG (50000 UNIT) CAPS capsule    Sig: Take 1 capsule (50,000 Units total) by mouth every 7 (seven) days.    Dispense:  4 capsule    Refill:  4     Danise Edge, MD

## 2020-02-01 ENCOUNTER — Other Ambulatory Visit: Payer: Self-pay

## 2020-02-01 DIAGNOSIS — O10019 Pre-existing essential hypertension complicating pregnancy, unspecified trimester: Secondary | ICD-10-CM

## 2020-03-04 ENCOUNTER — Other Ambulatory Visit: Payer: Self-pay

## 2020-03-04 ENCOUNTER — Other Ambulatory Visit: Payer: 59

## 2020-03-04 DIAGNOSIS — O10019 Pre-existing essential hypertension complicating pregnancy, unspecified trimester: Secondary | ICD-10-CM

## 2020-03-05 LAB — COMPREHENSIVE METABOLIC PANEL
AG Ratio: 1.6 (calc) (ref 1.0–2.5)
ALT: 15 U/L (ref 6–29)
AST: 15 U/L (ref 10–30)
Albumin: 4.4 g/dL (ref 3.6–5.1)
Alkaline phosphatase (APISO): 163 U/L — ABNORMAL HIGH (ref 31–125)
BUN: 10 mg/dL (ref 7–25)
CO2: 26 mmol/L (ref 20–32)
Calcium: 9.4 mg/dL (ref 8.6–10.2)
Chloride: 105 mmol/L (ref 98–110)
Creat: 0.8 mg/dL (ref 0.50–1.10)
Globulin: 2.8 g/dL (calc) (ref 1.9–3.7)
Glucose, Bld: 88 mg/dL (ref 65–99)
Potassium: 4.6 mmol/L (ref 3.5–5.3)
Sodium: 139 mmol/L (ref 135–146)
Total Bilirubin: 0.4 mg/dL (ref 0.2–1.2)
Total Protein: 7.2 g/dL (ref 6.1–8.1)

## 2020-03-20 ENCOUNTER — Other Ambulatory Visit: Payer: Self-pay

## 2020-03-20 ENCOUNTER — Ambulatory Visit (INDEPENDENT_AMBULATORY_CARE_PROVIDER_SITE_OTHER): Payer: 59 | Admitting: Family Medicine

## 2020-03-20 ENCOUNTER — Encounter: Payer: Self-pay | Admitting: Family Medicine

## 2020-03-20 VITALS — BP 146/97 | HR 84 | Ht 64.0 in | Wt 215.1 lb

## 2020-03-20 DIAGNOSIS — L910 Hypertrophic scar: Secondary | ICD-10-CM

## 2020-03-20 NOTE — Progress Notes (Signed)
  Keloid from BTL incision injected with Kenalog 20mg  and Lidocaine 60mL. Patient tolerated well.

## 2020-04-10 ENCOUNTER — Ambulatory Visit (INDEPENDENT_AMBULATORY_CARE_PROVIDER_SITE_OTHER): Payer: 59 | Admitting: Family Medicine

## 2020-04-10 ENCOUNTER — Other Ambulatory Visit: Payer: Self-pay

## 2020-04-10 ENCOUNTER — Encounter: Payer: Self-pay | Admitting: Family Medicine

## 2020-04-10 VITALS — BP 134/84 | HR 85 | Wt 216.0 lb

## 2020-04-10 DIAGNOSIS — L910 Hypertrophic scar: Secondary | ICD-10-CM

## 2020-04-10 NOTE — Progress Notes (Signed)
Keloid from BTL incision injected with Kenalog 20mg  and Lidocaine 60mL. Patient tolerated well.

## 2020-05-01 ENCOUNTER — Ambulatory Visit (INDEPENDENT_AMBULATORY_CARE_PROVIDER_SITE_OTHER): Payer: 59 | Admitting: Family Medicine

## 2020-05-01 ENCOUNTER — Other Ambulatory Visit: Payer: Self-pay

## 2020-05-01 ENCOUNTER — Encounter: Payer: Self-pay | Admitting: Family Medicine

## 2020-05-01 VITALS — BP 147/94 | HR 82 | Wt 215.0 lb

## 2020-05-01 DIAGNOSIS — L910 Hypertrophic scar: Secondary | ICD-10-CM | POA: Diagnosis not present

## 2020-05-01 NOTE — Progress Notes (Signed)
   Subjective:    Patient ID: Megan Hunt, female    DOB: 1985/05/23, 34 y.o.   MRN: [id number]  HPI  Here for f/u of keloid. No itching. Has decreased in thickness - still present. Happy with lack of symptoms.   Review of Systems     Objective:   Physical Exam Vitals reviewed.  Skin:    Comments: Umbilical keloid - improved. Slightly raised, but soft.         Assessment & Plan:  1. Keloid of skin Patient desires no further injections. Will f/u in a year or if symptoms return.

## 2020-07-29 ENCOUNTER — Other Ambulatory Visit: Payer: Self-pay | Admitting: Family Medicine

## 2020-07-29 ENCOUNTER — Encounter: Payer: Self-pay | Admitting: Family Medicine

## 2020-07-29 MED ORDER — CETIRIZINE HCL 10 MG PO TABS: 10.0000 mg | ORAL_TABLET | Freq: Every day | ORAL | 2 refills | Status: DC

## 2020-07-29 MED ORDER — METHYLPREDNISOLONE 4 MG PO TABS: ORAL_TABLET | ORAL | 0 refills | Status: DC

## 2020-07-29 MED ORDER — FLUTICASONE PROPIONATE 50 MCG/ACT NA SUSP
2.0000 | Freq: Every day | NASAL | 2 refills | Status: DC
Start: 2020-07-29 — End: 2020-11-21

## 2020-07-29 MED ORDER — FAMOTIDINE 20 MG PO TABS: 20.0000 mg | ORAL_TABLET | Freq: Two times a day (BID) | ORAL | 1 refills | Status: DC

## 2020-07-29 NOTE — Telephone Encounter (Signed)
FYI Spoke with patient and she stated the her eyes were swollen almost shut.  Advised patient to go an urgent care NOW.  Advised that she will need more that OTC recommendations.  Patient said ok.  Hopefully she will go.

## 2020-07-30 ENCOUNTER — Other Ambulatory Visit: Payer: Self-pay | Admitting: Family Medicine

## 2020-08-12 ENCOUNTER — Ambulatory Visit (INDEPENDENT_AMBULATORY_CARE_PROVIDER_SITE_OTHER): Payer: 59 | Admitting: Family Medicine

## 2020-08-12 ENCOUNTER — Encounter: Payer: Self-pay | Admitting: Family Medicine

## 2020-08-12 ENCOUNTER — Other Ambulatory Visit: Payer: Self-pay

## 2020-08-12 VITALS — BP 128/76 | HR 80 | Temp 98.1°F | Resp 16 | Ht 64.0 in | Wt 207.4 lb

## 2020-08-12 DIAGNOSIS — I10 Essential (primary) hypertension: Secondary | ICD-10-CM

## 2020-08-12 DIAGNOSIS — E559 Vitamin D deficiency, unspecified: Secondary | ICD-10-CM | POA: Diagnosis not present

## 2020-08-12 DIAGNOSIS — M25531 Pain in right wrist: Secondary | ICD-10-CM

## 2020-08-12 DIAGNOSIS — R739 Hyperglycemia, unspecified: Secondary | ICD-10-CM

## 2020-08-12 DIAGNOSIS — E7800 Pure hypercholesterolemia, unspecified: Secondary | ICD-10-CM

## 2020-08-12 DIAGNOSIS — M25532 Pain in left wrist: Secondary | ICD-10-CM

## 2020-08-12 DIAGNOSIS — L299 Pruritus, unspecified: Secondary | ICD-10-CM

## 2020-08-12 DIAGNOSIS — Z0000 Encounter for general adult medical examination without abnormal findings: Secondary | ICD-10-CM | POA: Diagnosis not present

## 2020-08-12 DIAGNOSIS — G43009 Migraine without aura, not intractable, without status migrainosus: Secondary | ICD-10-CM | POA: Diagnosis not present

## 2020-08-12 DIAGNOSIS — Z7289 Other problems related to lifestyle: Secondary | ICD-10-CM

## 2020-08-12 LAB — VITAMIN D 25 HYDROXY (VIT D DEFICIENCY, FRACTURES): VITD: 17.36 ng/mL — ABNORMAL LOW (ref 30.00–100.00)

## 2020-08-12 LAB — COMPREHENSIVE METABOLIC PANEL
ALT: 12 U/L (ref 0–35)
AST: 13 U/L (ref 0–37)
Albumin: 4.3 g/dL (ref 3.5–5.2)
Alkaline Phosphatase: 132 U/L — ABNORMAL HIGH (ref 39–117)
BUN: 7 mg/dL (ref 6–23)
CO2: 25 mEq/L (ref 19–32)
Calcium: 9.3 mg/dL (ref 8.4–10.5)
Chloride: 105 mEq/L (ref 96–112)
Creatinine, Ser: 0.8 mg/dL (ref 0.40–1.20)
GFR: 95.5 mL/min (ref >60.00)
Glucose, Bld: 87 mg/dL (ref 70–99)
Potassium: 4.3 mEq/L (ref 3.5–5.1)
Sodium: 138 mEq/L (ref 135–145)
Total Bilirubin: 0.5 mg/dL (ref 0.2–1.2)
Total Protein: 6.8 g/dL (ref 6.0–8.3)

## 2020-08-12 LAB — LIPID PANEL
Cholesterol: 207 mg/dL — ABNORMAL HIGH (ref 0–200)
HDL: 58.5 mg/dL (ref >39.00)
LDL Cholesterol: 130 mg/dL — ABNORMAL HIGH (ref 0–99)
NonHDL: 148.2
Total CHOL/HDL Ratio: 4
Triglycerides: 89 mg/dL (ref 0.0–149.0)
VLDL: 17.8 mg/dL (ref 0.0–40.0)

## 2020-08-12 LAB — HEMOGLOBIN A1C: Hgb A1c MFr Bld: 5.9 % (ref 4.6–6.5)

## 2020-08-12 LAB — CBC WITH DIFFERENTIAL/PLATELET
Basophils Absolute: 0 10*3/uL (ref 0.0–0.1)
Basophils Relative: 0.5 % (ref 0.0–3.0)
Eosinophils Absolute: 0.1 10*3/uL (ref 0.0–0.7)
Eosinophils Relative: 1.2 % (ref 0.0–5.0)
HCT: 36.8 % (ref 36.0–46.0)
Hemoglobin: 12.1 g/dL (ref 12.0–15.0)
Lymphocytes Relative: 27 % (ref 12.0–46.0)
Lymphs Abs: 2.3 10*3/uL (ref 0.7–4.0)
MCHC: 32.8 g/dL (ref 30.0–36.0)
MCV: 82.9 fl (ref 78.0–100.0)
Monocytes Absolute: 0.8 10*3/uL (ref 0.1–1.0)
Monocytes Relative: 8.9 % (ref 3.0–12.0)
Neutro Abs: 5.3 10*3/uL (ref 1.4–7.7)
Neutrophils Relative %: 62.4 % (ref 43.0–77.0)
Platelets: 246 10*3/uL (ref 150.0–400.0)
RBC: 4.44 Mil/uL (ref 3.87–5.11)
RDW: 13.5 % (ref 11.5–15.5)
WBC: 8.5 10*3/uL (ref 4.0–10.5)

## 2020-08-12 LAB — TSH: TSH: 0.68 u[IU]/mL (ref 0.35–4.50)

## 2020-08-12 MED ORDER — AMLODIPINE BESYLATE 10 MG PO TABS: 10.0000 mg | ORAL_TABLET | Freq: Every day | ORAL | 3 refills | Status: DC

## 2020-08-12 NOTE — Assessment & Plan Note (Signed)
hgba1c acceptable, minimize simple carbs. Increase exercise as tolerated.  

## 2020-08-12 NOTE — Progress Notes (Signed)
Patient ID: Megan Hunt, female    DOB: [birth year]  Age: 35 y.o. MRN: [id number]    Subjective:  Subjective  HPI Megan Hunt presents for presents for comprehensive physical exam today and follow up on management of chronic concerns. She reports that she feels pain and weakness that started 2-3 months ago in her hands bilaterally that gets worse when she picks her newborn or does any physical activity that requires lifting with wrists. She denies any redness or swelling, numbness, tingling in her wrists or any recent falls where she fell on her wrists. She states that the pain and weakness is worse in the right hand. She denies any recent sicknesses, recent ER visits, chest pain, SOB, fever, abdominal pain, cough, chills, sore throat, dysuria, urinary incontinence, back pain, HA, or N/VD. She reports that her 87 y/o father had recently suffered a TIA. She states that he has trouble walking and speaking. She endorses buying a treadmill for exercise and changing her diet. She reports getting the COVID-19 vaccination. She states that she experiences itching due to allergy season, which she takes benadryl in the evening to relief the symptoms.  Review of Systems  Constitutional: Positive for fatigue. Negative for chills and fever.  HENT: Negative for congestion, rhinorrhea, sinus pressure, sinus pain and sore throat.   Eyes: Negative for pain.  Respiratory: Negative for cough and shortness of breath.   Cardiovascular: Negative for chest pain, palpitations and leg swelling.  Gastrointestinal: Negative for abdominal pain, blood in stool, diarrhea, nausea and vomiting.  Genitourinary: Negative for decreased urine volume, flank pain, frequency, vaginal bleeding and vaginal discharge.  Musculoskeletal: Positive for arthralgias. Negative for back pain.  Neurological: Negative for headaches.    History Past Medical History:  Diagnosis Date  . Allergic rhinitis   . Anemia   . Anxiety and  depression   . Asthma, mild intermittent 10/27/2008   Qualifier: Diagnosis of  By: Kriste Basque MD, Lonzo Cloud   . Atypical chest pain   . Back pain   . Constipation   . History of migraines   . Hypercholesteremia   . Hypertension    no longer taking meds  . Insomnia 05/15/2016  . Somatic dysfunction     She has a past surgical history that includes Wisdom tooth extraction and Tubal ligation (Bilateral, 11/28/2019).   Her family history includes Arthritis in her brother; Birth defects in her paternal aunt; Cancer in her sister; Drug abuse in her daughter; Hypertension in her father, maternal grandfather, and mother; Stroke in her father and maternal grandfather.She reports that she has never smoked. She has never used smokeless tobacco. She reports that she does not drink alcohol and does not use drugs.  Current Outpatient Medications on File Prior to Visit  Medication Sig Dispense Refill  . cetirizine (ZYRTEC) 10 MG tablet Take 1 tablet (10 mg total) by mouth daily. 30 tablet 2  . diphenhydramine-acetaminophen (TYLENOL PM) 25-500 MG TABS tablet Take 1 tablet by mouth at bedtime as needed.    . famotidine (PEPCID) 20 MG tablet Take 1 tablet (20 mg total) by mouth 2 (two) times daily. 60 tablet 1  . fluticasone (FLONASE) 50 MCG/ACT nasal spray Place 2 sprays into both nostrils daily. 16 g 2  . methylPREDNISolone (MEDROL) 4 MG tablet 5 tab po qd X 1d then 4 tab po qd X 1d then 3 tab po qd X 1d then 2 tab po qd then 1 tab po qd 15 tablet 0  .  Vitamin D, Ergocalciferol, (DRISDOL) 1.25 MG (50000 UNIT) CAPS capsule Take 1 capsule (50,000 Units total) by mouth every 7 (seven) days. (Patient not taking: No sig reported) 4 capsule 4   No current facility-administered medications on file prior to visit.     Objective:  Objective  Physical Exam Constitutional:      General: She is not in acute distress.    Appearance: Normal appearance. She is well-developed. She is not ill-appearing or toxic-appearing.   HENT:     Head: Normocephalic and atraumatic.     Right Ear: Tympanic membrane, ear canal and external ear normal.     Left Ear: Tympanic membrane, ear canal and external ear normal.     Nose: No congestion or rhinorrhea.  Eyes:     Extraocular Movements: Extraocular movements intact.     Conjunctiva/sclera: Conjunctivae normal.     Pupils: Pupils are equal, round, and reactive to light.  Neck:     Thyroid: No thyromegaly.  Cardiovascular:     Rate and Rhythm: Normal rate and regular rhythm.     Pulses: Normal pulses.     Heart sounds: Normal heart sounds. No murmur heard.   Pulmonary:     Effort: Pulmonary effort is normal. No respiratory distress.     Breath sounds: Normal breath sounds. No wheezing, rhonchi or rales.  Abdominal:     General: Bowel sounds are normal. There is no distension.     Palpations: Abdomen is soft. There is no mass.     Tenderness: There is no abdominal tenderness. There is no guarding.     Hernia: No hernia is present.  Musculoskeletal:        General: Normal range of motion.     Cervical back: Normal range of motion and neck supple.  Lymphadenopathy:     Cervical: No cervical adenopathy.  Skin:    General: Skin is warm and dry.  Neurological:     Mental Status: She is alert and oriented to person, place, and time.  Psychiatric:        Behavior: Behavior normal.    BP 128/76   Pulse 80   Temp 98.1 F (36.7 C)   Resp 16   Ht 5\' 4"  (1.626 m)   Wt 207 lb 6.4 oz (94.1 kg)   SpO2 99%   BMI 35.60 kg/m  Wt Readings from Last 3 Encounters:  08/12/20 207 lb 6.4 oz (94.1 kg)  05/01/20 215 lb (97.5 kg)  04/10/20 216 lb (98 kg)     Lab Results  Component Value Date   WBC 7.7 01/29/2020   HGB 12.1 01/29/2020   HCT 37.2 01/29/2020   PLT 317 01/29/2020   GLUCOSE 88 03/04/2020   CHOL 204 (H) 01/29/2020   TRIG 210 (H) 01/29/2020   HDL 58 01/29/2020   LDLDIRECT 177.4 06/15/2013   LDLCALC 113 (H) 01/29/2020   ALT 15 03/04/2020   AST 15  03/04/2020   NA 139 03/04/2020   K 4.6 03/04/2020   CL 105 03/04/2020   CREATININE 0.80 03/04/2020   BUN 10 03/04/2020   CO2 26 03/04/2020   TSH 0.82 01/29/2020   HGBA1C 5.9 (H) 01/29/2020    No results found.   Assessment & Plan:  Plan    Meds ordered this encounter  Medications  . amLODipine (NORVASC) 10 MG tablet    Sig: Take 1 tablet (10 mg total) by mouth daily.    Dispense:  90 tablet    Refill:  3  Problem List Items Addressed This Visit    HYPERCHOLESTEROLEMIA    Encouraged heart healthy diet, increase exercise, avoid trans fats, consider a krill oil cap daily      Relevant Medications   amLODipine (NORVASC) 10 MG tablet   HTN (hypertension)    Well controlled, no changes to meds. Encouraged heart healthy diet such as the DASH diet and exercise as tolerated.       Relevant Medications   amLODipine (NORVASC) 10 MG tablet   Migraine   Relevant Medications   amLODipine (NORVASC) 10 MG tablet   Preventative health care - Primary    Patient encouraged to maintain heart healthy diet, regular exercise, adequate sleep. Consider daily probiotics. Take medications as prescribed. Follows with ob/gyn for paps and is utd. Labs ordered and reviewed      Relevant Orders   CBC with Differential/Platelet   Comprehensive metabolic panel   TSH   Lipid panel   Hyperglycemia    hgba1c acceptable, minimize simple carbs. Increase exercise as tolerated.       Relevant Orders   Hemoglobin A1c   Vitamin D deficiency    Supplement and monitor      Relevant Orders   VITAMIN D 25 Hydroxy (Vit-D Deficiency, Fractures)   Pruritus    Happens after she has been outside and especially when the pollen count is high, Zyrtec bid, Famotidine 20 mg bid, Benadryl qhs, report worsening symptoms      Pain in both wrists    For weeks now, no fall or trauma, no swelling or warmth. Try ice, elevation, rest and report back       Relevant Orders   Ambulatory referral to Orthopedic  Surgery    Other Visit Diagnoses    Other problems related to lifestyle       Relevant Orders   Hepatitis C antibody     Pap smear: Last completed on 06/05/2019, results were negative, repeat every 3 years.  Follow-up: Return in about 6 months (around 02/11/2021), or 6 mn f/u.   I,David Hanna,acting as a scribe for Danise Edge, MD.,have documented all relevant documentation on the behalf of Danise Edge, MD,as directed by  Danise Edge, MD while in the presence of Danise Edge, MD.  I, Bradd Canary, MD personally performed the services described in this documentation. All medical record entries made by the scribe were at my direction and in my presence. I have reviewed the chart and agree that the record reflects my personal performance and is accurate and complete

## 2020-08-12 NOTE — Assessment & Plan Note (Signed)
Encouraged heart healthy diet, increase exercise, avoid trans fats, consider a krill oil cap daily 

## 2020-08-12 NOTE — Assessment & Plan Note (Signed)
Happens after she has been outside and especially when the pollen count is high, Zyrtec bid, Famotidine 20 mg bid, Benadryl qhs, report worsening symptoms

## 2020-08-12 NOTE — Assessment & Plan Note (Signed)
Patient encouraged to maintain heart healthy diet, regular exercise, adequate sleep. Consider daily probiotics. Take medications as prescribed. Follows with ob/gyn for paps and is utd. Labs ordered and reviewed

## 2020-08-12 NOTE — Patient Instructions (Signed)
Preventive Care 21-36 Years Old, Female Preventive care refers to lifestyle choices and visits with your health care provider that can promote health and wellness. This includes:  A yearly physical exam. This is also called an annual wellness visit.  Regular dental and eye exams.  Immunizations.  Screening for certain conditions.  Healthy lifestyle choices, such as: ? Eating a healthy diet. ? Getting regular exercise. ? Not using drugs or products that contain nicotine and tobacco. ? Limiting alcohol use. What can I expect for my preventive care visit? Physical exam Your health care provider may check your:  Height and weight. These may be used to calculate your BMI (body mass index). BMI is a measurement that tells if you are at a healthy weight.  Heart rate and blood pressure.  Body temperature.  Skin for abnormal spots. Counseling Your health care provider may ask you questions about your:  Past medical problems.  Family's medical history.  Alcohol, tobacco, and drug use.  Emotional well-being.  Home life and relationship well-being.  Sexual activity.  Diet, exercise, and sleep habits.  Work and work environment.  Access to firearms.  Method of birth control.  Menstrual cycle.  Pregnancy history. What immunizations do I need? Vaccines are usually given at various ages, according to a schedule. Your health care provider will recommend vaccines for you based on your age, medical history, and lifestyle or other factors, such as travel or where you work.   What tests do I need? Blood tests  Lipid and cholesterol levels. These may be checked every 5 years starting at age 20.  Hepatitis C test.  Hepatitis B test. Screening  Diabetes screening. This is done by checking your blood sugar (glucose) after you have not eaten for a while (fasting).  STD (sexually transmitted disease) testing, if you are at risk.  BRCA-related cancer screening. This may be  done if you have a family history of breast, ovarian, tubal, or peritoneal cancers.  Pelvic exam and Pap test. This may be done every 3 years starting at age 21. Starting at age 30, this may be done every 5 years if you have a Pap test in combination with an HPV test. Talk with your health care provider about your test results, treatment options, and if necessary, the need for more tests.   Follow these instructions at home: Eating and drinking  Eat a healthy diet that includes fresh fruits and vegetables, whole grains, lean protein, and low-fat dairy products.  Take vitamin and mineral supplements as recommended by your health care provider.  Do not drink alcohol if: ? Your health care provider tells you not to drink. ? You are pregnant, may be pregnant, or are planning to become pregnant.  If you drink alcohol: ? Limit how much you have to 0-1 drink a day. ? Be aware of how much alcohol is in your drink. In the [place] one drink equals one 12 oz bottle of beer (355 mL), one 5 oz glass of wine (148 mL), or one 1 oz glass of hard liquor (44 mL).   Lifestyle  Take daily care of your teeth and gums. Brush your teeth every morning and night with fluoride toothpaste. Floss one time each day.  Stay active. Exercise for at least 30 minutes 5 or more days each week.  Do not use any products that contain nicotine or tobacco, such as cigarettes, e-cigarettes, and chewing tobacco. If you need help quitting, ask your health care provider.  Do not   use drugs.  If you are sexually active, practice safe sex. Use a condom or other form of protection to prevent STIs (sexually transmitted infections).  If you do not wish to become pregnant, use a form of birth control. If you plan to become pregnant, see your health care provider for a prepregnancy visit.  Find healthy ways to cope with stress, such as: ? Meditation, yoga, or listening to music. ? Journaling. ? Talking to a trusted  person. ? Spending time with friends and family. Safety  Always wear your seat belt while driving or riding in a vehicle.  Do not drive: ? If you have been drinking alcohol. Do not ride with someone who has been drinking. ? When you are tired or distracted. ? While texting.  Wear a helmet and other protective equipment during sports activities.  If you have firearms in your house, make sure you follow all gun safety procedures.  Seek help if you have been physically or sexually abused. What's next?  Go to your health care provider once a year for an annual wellness visit.  Ask your health care provider how often you should have your eyes and teeth checked.  Stay up to date on all vaccines. This information is not intended to replace advice given to you by your health care provider. Make sure you discuss any questions you have with your health care provider. Document Revised: 12/23/2019 Document Reviewed: 01/05/2018 Elsevier Patient Education  2021 [employer] Inc.  

## 2020-08-12 NOTE — Assessment & Plan Note (Signed)
Well controlled, no changes to meds. Encouraged heart healthy diet such as the DASH diet and exercise as tolerated.  °

## 2020-08-12 NOTE — Assessment & Plan Note (Signed)
Supplement and monitor 

## 2020-08-12 NOTE — Assessment & Plan Note (Signed)
For weeks now, no fall or trauma, no swelling or warmth. Try ice, elevation, rest and report back

## 2020-08-13 LAB — HEPATITIS C ANTIBODY
Hepatitis C Ab: NONREACTIVE
SIGNAL TO CUT-OFF: 0 (ref <1.00)

## 2020-08-15 ENCOUNTER — Other Ambulatory Visit: Payer: Self-pay | Admitting: *Deleted

## 2020-08-15 MED ORDER — VITAMIN D (ERGOCALCIFEROL) 1.25 MG (50000 UNIT) PO CAPS: 50000.0000 [IU] | ORAL_CAPSULE | ORAL | 4 refills | Status: DC

## 2020-08-20 ENCOUNTER — Other Ambulatory Visit: Payer: Self-pay | Admitting: Family Medicine

## 2020-10-23 ENCOUNTER — Other Ambulatory Visit: Payer: Self-pay | Admitting: Family Medicine

## 2020-11-21 ENCOUNTER — Ambulatory Visit (HOSPITAL_COMMUNITY)
Admission: EM | Admit: 2020-11-21 | Discharge: 2020-11-21 | Disposition: A | Discharge status: Home / Self Care | Payer: 59 | Attending: Medical Oncology | Admitting: Medical Oncology

## 2020-11-21 ENCOUNTER — Encounter (HOSPITAL_COMMUNITY): Payer: Self-pay | Admitting: Emergency Medicine

## 2020-11-21 ENCOUNTER — Other Ambulatory Visit: Payer: Self-pay

## 2020-11-21 DIAGNOSIS — H60393 Other infective otitis externa, bilateral: Secondary | ICD-10-CM

## 2020-11-21 DIAGNOSIS — J069 Acute upper respiratory infection, unspecified: Secondary | ICD-10-CM | POA: Diagnosis not present

## 2020-11-21 MED ORDER — BENZONATATE 100 MG PO CAPS
100.0000 mg | ORAL_CAPSULE | Freq: Three times a day (TID) | ORAL | 0 refills | Status: DC
Start: 2020-11-21 — End: 2021-02-24

## 2020-11-21 MED ORDER — NEOMYCIN-POLYMYXIN-HC 3.5-10000-1 OT SUSP
4.0000 [drp] | Freq: Three times a day (TID) | OTIC | 0 refills | Status: DC
Start: 2020-11-21 — End: 2022-01-12

## 2020-11-21 MED ORDER — FLUTICASONE PROPIONATE 50 MCG/ACT NA SUSP
2.0000 | Freq: Every day | NASAL | 0 refills | Status: DC
Start: 2020-11-21 — End: 2024-02-01

## 2020-11-21 NOTE — ED Triage Notes (Signed)
Pt is present today with right ear pain, cough, and sore throat. Pt states that her sx started Monday. Pt denies any fever

## 2020-11-21 NOTE — ED Provider Notes (Signed)
[facility]    CSN: [id number] Arrival date & time: 11/21/20  1918      History   Chief Complaint Chief Complaint  Patient presents with   Sore Throat   Otalgia    Right    Cough    HPI Megan Hunt is a 35 y.o. female.   HPI  Cold Symptoms: Patient reports that since Monday she has had a sore throat, ear pain and cough.  She notes that all of her children are also sick with similar symptoms.  She reports that one of her sons has an ear infection and if you have conjunctivitis.  She reports that she has been taking some NyQuil and DayQuil for her symptoms with some relief.  She denies any known fevers, chest pain or shortness of breath.  Past Medical History:  Diagnosis Date   Allergic rhinitis    Anemia    Anxiety and depression    Asthma, mild intermittent 10/27/2008   Qualifier: Diagnosis of  By: Kriste Basque MD, Lonzo Cloud    Atypical chest pain    Back pain    Constipation    History of migraines    Hypercholesteremia    Hypertension    no longer taking meds   Insomnia 05/15/2016   Somatic dysfunction     Patient Active Problem List   Diagnosis Date Noted   Pruritus 08/12/2020   Pain in both wrists 08/12/2020   Hyperglycemia 01/30/2020   Hypocalcemia 01/30/2020   Vitamin D deficiency 01/30/2020   Sinusitis 07/13/2018   Preventative health care 11/27/2017   Anemia 11/27/2017   Insomnia 05/15/2016   Fatigue 08/08/2014   Hemorrhoids, external 01/05/2011   Fibromyalgia 01/05/2011   Constipation 01/03/2010   HTN (hypertension) 09/29/2009   HYPERCHOLESTEROLEMIA 10/27/2008   ANXIETY DEPRESSION 10/27/2008   Allergic rhinitis 10/27/2008   Asthma, mild intermittent 10/27/2008   Backache 10/27/2008   CHEST PAIN, ATYPICAL 10/27/2008   Migraine 03/29/2007    Past Surgical History:  Procedure Laterality Date   TUBAL LIGATION Bilateral 11/28/2019   Procedure: POST PARTUM TUBAL LIGATION;  Surgeon: Federico Flake, MD;  Location: [facility];  Service:  Gynecology;  Laterality: Bilateral;   WISDOM TOOTH EXTRACTION      OB History     Gravida  5   Para  5   Term  4   Preterm  1   AB      Living  5      SAB      IAB      Ectopic      Multiple  0   Live Births  5            Home Medications    Prior to Admission medications   Medication Sig Start Date End Date Taking? Authorizing Provider  amLODipine (NORVASC) 10 MG tablet Take 1 tablet (10 mg total) by mouth daily. 08/12/20   Bradd Canary, MD  cetirizine (ZYRTEC) 10 MG tablet Take 1 tablet (10 mg total) by mouth daily. 10/23/20   Bradd Canary, MD  diphenhydramine-acetaminophen (TYLENOL PM) 25-500 MG TABS tablet Take 1 tablet by mouth at bedtime as needed.    [provider]  famotidine (PEPCID) 20 MG tablet Take 1 tablet (20 mg total) by mouth daily. 08/20/20   Bradd Canary, MD  fluticasone (FLONASE) 50 MCG/ACT nasal spray Place 2 sprays into both nostrils daily. 07/29/20   Bradd Canary, MD  methylPREDNISolone (MEDROL) 4 MG tablet  5 tab po qd X 1d then 4 tab po qd X 1d then 3 tab po qd X 1d then 2 tab po qd then 1 tab po qd 07/29/20   Bradd Canary, MD  Vitamin D, Ergocalciferol, (DRISDOL) 1.25 MG (50000 UNIT) CAPS capsule Take 1 capsule (50,000 Units total) by mouth every 7 (seven) days. 08/15/20   Bradd Canary, MD    Family History Family History  Problem Relation Age of Onset   Hypertension Father    Stroke Father        poor speech, weakness   Hypertension Mother    Arthritis Brother    Drug abuse Daughter    Stroke Maternal Grandfather    Hypertension Maternal Grandfather    Cancer Sister    Birth defects Paternal Aunt     Social History Social History   Tobacco Use   Smoking status: Never   Smokeless tobacco: Never  Vaping Use   Vaping Use: Never used  Substance Use Topics   Alcohol use: No   Drug use: No     Allergies   Patient has no known allergies.   Review of Systems Review of Systems  As stated above in  HPI Physical Exam Triage Vital Signs ED Triage Vitals  Enc Vitals Group     BP 11/21/20 1944 (!) 162/109     Pulse Rate 11/21/20 1944 (!) 107     Resp 11/21/20 1944 18     Temp 11/21/20 1944 98.2 F (36.8 C)     Temp src --      SpO2 11/21/20 1944 100 %     Weight --      Height --      Head Circumference --      Peak Flow --      Pain Score 11/21/20 1941 10     Pain Loc --      Pain Edu? --      Excl. in GC? --    No data found.  Updated Vital Signs BP (!) 162/109 (BP Location: Right Arm) Comment: pt did not take her BP medication today  Pulse (!) 107   Temp 98.2 F (36.8 C)   Resp 18   SpO2 100%    Physical Exam Vitals and nursing note reviewed.  Constitutional:      General: She is not in acute distress.    Appearance: She is well-developed. She is not ill-appearing, toxic-appearing or diaphoretic.  HENT:     Head: Normocephalic and atraumatic.     Right Ear: Tympanic membrane normal. Swelling present.     Left Ear: Tympanic membrane normal. Swelling present.     Nose: Rhinorrhea present. No congestion.     Mouth/Throat:     Pharynx: Oropharynx is clear. Uvula midline. No oropharyngeal exudate or posterior oropharyngeal erythema.     Tonsils: No tonsillar exudate or tonsillar abscesses.  Eyes:     Conjunctiva/sclera: Conjunctivae normal.     Pupils: Pupils are equal, round, and reactive to light.  Cardiovascular:     Rate and Rhythm: Normal rate and regular rhythm.     Heart sounds: Normal heart sounds.  Pulmonary:     Effort: Pulmonary effort is normal.     Breath sounds: Normal breath sounds.  Musculoskeletal:     Cervical back: Neck supple.  Skin:    General: Skin is warm.  Neurological:     Mental Status: She is alert and oriented to person, place, and time.  UC Treatments / Results  Labs (all labs ordered are listed, but only abnormal results are displayed) Labs Reviewed - No data to display  EKG   Radiology No results  found.  Procedures Procedures (including critical care time)  Medications Ordered in UC Medications - No data to display  Initial Impression / Assessment and Plan / UC Course  I have reviewed the triage vital signs and the nursing notes.  Pertinent labs & imaging results that were available during my care of the patient were reviewed by me and considered in my medical decision making (see chart for details).     New.  Discussed that this likely is a viral illness.  She declines any COVID-19 or influenza testing.  For now sending in [place] and Flonase along with Cortisporin drops to help with her otitis externa.  Discussed red flag signs and symptoms.  Rest and hydration encouraged.  Follow-up as needed. Final Clinical Impressions(s) / UC Diagnoses   Final diagnoses:  None   Discharge Instructions   None    ED Prescriptions   None    PDMP not reviewed this encounter.   Rushie Chestnut, Cordelia Poche 11/21/20 2027

## 2021-02-24 ENCOUNTER — Ambulatory Visit: Payer: 59 | Admitting: Family Medicine

## 2021-02-24 ENCOUNTER — Other Ambulatory Visit: Payer: Self-pay

## 2021-02-24 VITALS — BP 132/74 | HR 86 | Temp 98.2°F | Resp 16 | Wt 192.8 lb

## 2021-02-24 DIAGNOSIS — R739 Hyperglycemia, unspecified: Secondary | ICD-10-CM

## 2021-02-24 DIAGNOSIS — I10 Essential (primary) hypertension: Secondary | ICD-10-CM | POA: Diagnosis not present

## 2021-02-24 DIAGNOSIS — E559 Vitamin D deficiency, unspecified: Secondary | ICD-10-CM | POA: Diagnosis not present

## 2021-02-24 DIAGNOSIS — E7800 Pure hypercholesterolemia, unspecified: Secondary | ICD-10-CM

## 2021-02-24 DIAGNOSIS — F341 Dysthymic disorder: Secondary | ICD-10-CM

## 2021-02-24 LAB — LIPID PANEL
Cholesterol: 220 mg/dL — ABNORMAL HIGH (ref 0–200)
HDL: 65.9 mg/dL (ref >39.00)
LDL Cholesterol: 125 mg/dL — ABNORMAL HIGH (ref 0–99)
NonHDL: 154.51
Total CHOL/HDL Ratio: 3
Triglycerides: 146 mg/dL (ref 0.0–149.0)
VLDL: 29.2 mg/dL (ref 0.0–40.0)

## 2021-02-24 LAB — COMPREHENSIVE METABOLIC PANEL
ALT: 11 U/L (ref 0–35)
AST: 13 U/L (ref 0–37)
Albumin: 4.6 g/dL (ref 3.5–5.2)
Alkaline Phosphatase: 143 U/L — ABNORMAL HIGH (ref 39–117)
BUN: 10 mg/dL (ref 6–23)
CO2: 26 mEq/L (ref 19–32)
Calcium: 9.5 mg/dL (ref 8.4–10.5)
Chloride: 101 mEq/L (ref 96–112)
Creatinine, Ser: 0.65 mg/dL (ref 0.40–1.20)
GFR: 113.69 mL/min (ref >60.00)
Glucose, Bld: 84 mg/dL (ref 70–99)
Potassium: 4.4 mEq/L (ref 3.5–5.1)
Sodium: 136 mEq/L (ref 135–145)
Total Bilirubin: 0.4 mg/dL (ref 0.2–1.2)
Total Protein: 7.3 g/dL (ref 6.0–8.3)

## 2021-02-24 LAB — CBC
HCT: 36.8 % (ref 36.0–46.0)
Hemoglobin: 12 g/dL (ref 12.0–15.0)
MCHC: 32.6 g/dL (ref 30.0–36.0)
MCV: 83.4 fl (ref 78.0–100.0)
Platelets: 269 10*3/uL (ref 150.0–400.0)
RBC: 4.42 Mil/uL (ref 3.87–5.11)
RDW: 13.5 % (ref 11.5–15.5)
WBC: 8.6 10*3/uL (ref 4.0–10.5)

## 2021-02-24 LAB — TSH: TSH: 1.4 u[IU]/mL (ref 0.35–5.50)

## 2021-02-24 LAB — HEMOGLOBIN A1C: Hgb A1c MFr Bld: 5.7 % (ref 4.6–6.5)

## 2021-02-24 LAB — VITAMIN D 25 HYDROXY (VIT D DEFICIENCY, FRACTURES): VITD: 22.1 ng/mL — ABNORMAL LOW (ref 30.00–100.00)

## 2021-02-24 NOTE — Progress Notes (Signed)
Patient ID: Megan Hunt, female    DOB: [birth year]  Age: 35 y.o. MRN: [id number]    Subjective:   Chief Complaint  Patient presents with   6 months follow up    Headaches and fatigue    Subjective   HPI Megan Hunt presents for office visit today for follow up on htn and migraines. She has been experiencing HA's for 3 weeks now accompanied with fatigue. She notes that she was not consistent with her BP medication. Denies CP/palp/SOB/congestion/fevers/GI or GU c/o. Taking meds as prescribed.  She notes increased appetite with weight change. She is experiencing focus problems due to overwhelming life stresses.    Review of Systems  Constitutional:  Positive for appetite change (increased) and fatigue. Negative for chills and fever.  HENT:  Negative for congestion, rhinorrhea, sinus pressure, sinus pain and sore throat.   Eyes:  Negative for pain.  Respiratory:  Negative for cough and shortness of breath.   Cardiovascular:  Negative for chest pain, palpitations and leg swelling.  Gastrointestinal:  Negative for abdominal pain, blood in stool, diarrhea, nausea and vomiting.  Genitourinary:  Negative for decreased urine volume, flank pain, frequency, vaginal bleeding and vaginal discharge.  Musculoskeletal:  Negative for back pain.  Neurological:  Positive for headaches.  Psychiatric/Behavioral:  The patient is nervous/anxious.    History Past Medical History:  Diagnosis Date   Allergic rhinitis    Anemia    Anxiety and depression    Asthma, mild intermittent 10/27/2008   Qualifier: Diagnosis of  By: Kriste Basque MD, Lonzo Cloud    Atypical chest pain    Back pain    Constipation    History of migraines    Hypercholesteremia    Hypertension    no longer taking meds   Insomnia 05/15/2016   Somatic dysfunction     She has a past surgical history that includes Wisdom tooth extraction and Tubal ligation (Bilateral, 11/28/2019).   Her family history includes Arthritis in her  brother; Birth defects in her paternal aunt; Cancer in her sister; Drug abuse in her daughter; Hypertension in her father, maternal grandfather, and mother; Stroke in her father and maternal grandfather.She reports that she has never smoked. She has never used smokeless tobacco. She reports that she does not drink alcohol and does not use drugs.  Current Outpatient Medications on File Prior to Visit  Medication Sig Dispense Refill   amLODipine (NORVASC) 10 MG tablet Take 1 tablet (10 mg total) by mouth daily. 90 tablet 3   cetirizine (ZYRTEC) 10 MG tablet Take 1 tablet (10 mg total) by mouth daily. 90 tablet 1   diphenhydramine-acetaminophen (TYLENOL PM) 25-500 MG TABS tablet Take 1 tablet by mouth at bedtime as needed.     famotidine (PEPCID) 20 MG tablet Take 1 tablet (20 mg total) by mouth daily. 180 tablet 1   fluticasone (FLONASE) 50 MCG/ACT nasal spray Place 2 sprays into both nostrils daily. 16 mL 0   neomycin-polymyxin-hydrocortisone (CORTISPORIN) 3.5-10000-1 OTIC suspension Place 4 drops into both ears 3 (three) times daily. 10 mL 0   Vitamin D, Ergocalciferol, (DRISDOL) 1.25 MG (50000 UNIT) CAPS capsule Take 1 capsule (50,000 Units total) by mouth every 7 (seven) days. (Patient not taking: Reported on 02/24/2021) 4 capsule 4   No current facility-administered medications on file prior to visit.     Objective:  Objective  Physical Exam Constitutional:      General: She is not in acute distress.    Appearance: Normal  appearance. She is not ill-appearing or toxic-appearing.  HENT:     Head: Normocephalic and atraumatic.     Right Ear: Tympanic membrane, ear canal and external ear normal.     Left Ear: Tympanic membrane, ear canal and external ear normal.     Nose: No congestion or rhinorrhea.  Eyes:     Extraocular Movements: Extraocular movements intact.     Pupils: Pupils are equal, round, and reactive to light.  Cardiovascular:     Rate and Rhythm: Normal rate and regular  rhythm.     Pulses: Normal pulses.     Heart sounds: Normal heart sounds. No murmur heard. Pulmonary:     Effort: Pulmonary effort is normal. No respiratory distress.     Breath sounds: Normal breath sounds. No wheezing, rhonchi or rales.  Abdominal:     General: Bowel sounds are normal.     Palpations: Abdomen is soft. There is no mass.     Tenderness: There is no abdominal tenderness. There is no guarding.     Hernia: No hernia is present.  Musculoskeletal:        General: Normal range of motion.     Cervical back: Normal range of motion and neck supple.  Skin:    General: Skin is warm and dry.  Neurological:     Mental Status: She is alert and oriented to person, place, and time.  Psychiatric:        Behavior: Behavior normal.   BP 132/74   Pulse 86   Temp 98.2 F (36.8 C)   Resp 16   Wt 192 lb 12.8 oz (87.5 kg)   SpO2 99%   BMI 33.09 kg/m  Wt Readings from Last 3 Encounters:  02/24/21 192 lb 12.8 oz (87.5 kg)  08/12/20 207 lb 6.4 oz (94.1 kg)  05/01/20 215 lb (97.5 kg)     Lab Results  Component Value Date   WBC 8.6 02/24/2021   HGB 12.0 02/24/2021   HCT 36.8 02/24/2021   PLT 269.0 02/24/2021   GLUCOSE 84 02/24/2021   CHOL 220 (H) 02/24/2021   TRIG 146.0 02/24/2021   HDL 65.90 02/24/2021   LDLDIRECT 177.4 06/15/2013   LDLCALC 125 (H) 02/24/2021   ALT 11 02/24/2021   AST 13 02/24/2021   NA 136 02/24/2021   K 4.4 02/24/2021   CL 101 02/24/2021   CREATININE 0.65 02/24/2021   BUN 10 02/24/2021   CO2 26 02/24/2021   TSH 1.40 02/24/2021   HGBA1C 5.7 02/24/2021    No results found.   Assessment & Plan:  Plan    No orders of the defined types were placed in this encounter.   Problem List Items Addressed This Visit     HYPERCHOLESTEROLEMIA - Primary    Encourage heart healthy diet such as MIND or DASH diet, increase exercise, avoid trans fats, simple carbohydrates and processed foods, consider a krill or fish or flaxseed oil cap daily.        Relevant Orders   Lipid panel (Completed)   ANXIETY DEPRESSION    Notes significant stressors with fatigue and headaches recently but she declines medication management and she will notify us if that changes      HTN (hypertension)    Well controlled, no changes to meds. Encouraged heart healthy diet such as the DASH diet and exercise as tolerated.       Relevant Orders   CBC (Completed)   TSH (Completed)   Hyperglycemia    hgba1c  acceptable, minimize simple carbs. Increase exercise as tolerated.       Relevant Orders   Hemoglobin A1c (Completed)   Comprehensive metabolic panel (Completed)   Vitamin D deficiency    Labs reveal deficiency. Start on Vitamin D 81448 IU caps, 1 cap po weekly x 12 weeks. Disp #4 with 4 rf. Also take daily Vitamin D over the counter. If already taking a daily supplement increase by 1000 IU daily and if not start Vitamin D 2000 IU daily.       Relevant Orders   VITAMIN D 25 Hydroxy (Vit-D Deficiency, Fractures) (Completed)    Follow-up: Return in about 6 months (around 08/25/2021).  I, Billie Lade, acting as a scribe for Danise Edge, MD, have documented all relevent documentation on behalf of Danise Edge, MD, as directed by Danise Edge, MD while in the presence of Danise Edge, MD. DO:02/25/21.  I, Bradd Canary, MD personally performed the services described in this documentation. All medical record entries made by the scribe were at my direction and in my presence. I have reviewed the chart and agree that the record reflects my personal performance and is accurate and complete

## 2021-02-24 NOTE — Patient Instructions (Signed)
Bivalent covid booster is the new covid shot available with walk-ins downstairs at the [facility] pharmacy Mon-Fri, 9am-3pm or at any other pharmacy  Molnupiravir/Paxlovid is the new COVID medication we can give you if you get COVID so make sure you test if you have symptoms because we have to treat by day 5 of symptoms for it to be effective. If you are positive let us know so we can treat. If a home test is negative and your symptoms are persistent get a PCR test. Can check testing locations at [email or web address] If you are positive we will make an appointment with [place] and we will send in molnupiravir/paxlovid if you would like it. Check with your pharmacy before we meet to confirm they have it in stock, if they do not then we can get the prescription at the [facility]

## 2021-02-25 ENCOUNTER — Other Ambulatory Visit (INDEPENDENT_AMBULATORY_CARE_PROVIDER_SITE_OTHER): Payer: 59

## 2021-02-25 DIAGNOSIS — R748 Abnormal levels of other serum enzymes: Secondary | ICD-10-CM | POA: Diagnosis not present

## 2021-02-25 LAB — GAMMA GT: GGT: 12 U/L (ref 7–51)

## 2021-02-25 NOTE — Assessment & Plan Note (Signed)
Notes significant stressors with fatigue and headaches recently but she declines medication management and she will notify us if that changes

## 2021-02-25 NOTE — Assessment & Plan Note (Signed)
hgba1c acceptable, minimize simple carbs. Increase exercise as tolerated.  

## 2021-02-25 NOTE — Assessment & Plan Note (Signed)
Encourage heart healthy diet such as MIND or DASH diet, increase exercise, avoid trans fats, simple carbohydrates and processed foods, consider a krill or fish or flaxseed oil cap daily.  °

## 2021-02-25 NOTE — Addendum Note (Signed)
Addended by: Miguel Aschoff on: 02/25/2021 03:38 PM   Modules accepted: Orders

## 2021-02-25 NOTE — Assessment & Plan Note (Signed)
Well controlled, no changes to meds. Encouraged heart healthy diet such as the DASH diet and exercise as tolerated.  °

## 2021-02-25 NOTE — Assessment & Plan Note (Signed)
Labs reveal deficiency. Start on Vitamin D 50000 IU caps, 1 cap po weekly x 12 weeks. Disp #4 with 4 rf. Also take daily Vitamin D over the counter. If already taking a daily supplement increase by 1000 IU daily and if not start Vitamin D 2000 IU daily.  

## 2021-02-27 ENCOUNTER — Other Ambulatory Visit: Payer: Self-pay

## 2021-02-27 DIAGNOSIS — E559 Vitamin D deficiency, unspecified: Secondary | ICD-10-CM

## 2021-02-27 DIAGNOSIS — I10 Essential (primary) hypertension: Secondary | ICD-10-CM

## 2021-02-27 DIAGNOSIS — E7800 Pure hypercholesterolemia, unspecified: Secondary | ICD-10-CM

## 2021-04-06 ENCOUNTER — Encounter: Payer: Self-pay | Admitting: Family Medicine

## 2021-05-26 ENCOUNTER — Other Ambulatory Visit: Payer: Self-pay

## 2021-06-11 ENCOUNTER — Other Ambulatory Visit: Payer: Self-pay | Admitting: Family Medicine

## 2021-09-01 ENCOUNTER — Ambulatory Visit: Payer: 59 | Admitting: Family Medicine

## 2021-12-18 ENCOUNTER — Other Ambulatory Visit: Payer: Self-pay | Admitting: Family Medicine

## 2022-01-11 NOTE — Assessment & Plan Note (Addendum)
Stress continues to be present her mother has been in and out of the hospital recently. Patient is offered a referral to [facility] or an antidepressant but declines for now.

## 2022-01-11 NOTE — Assessment & Plan Note (Addendum)
Poorly controlled but she has not been taking her Amlodipine due to forgetting secondary to so many stressors. Advised to take it every day and come back for a BP check in a couple weeks and to seek care sooner if BP continues to climb quickly despite restarting meds. Encouraged heart healthy diet such as the DASH diet and exercise as tolerated.

## 2022-01-11 NOTE — Assessment & Plan Note (Signed)
Supplement and monitor 

## 2022-01-11 NOTE — Assessment & Plan Note (Signed)
Encourage heart healthy diet such as MIND or DASH diet, increase exercise, avoid trans fats, simple carbohydrates and processed foods, consider a krill or fish or flaxseed oil cap daily.  °

## 2022-01-12 ENCOUNTER — Other Ambulatory Visit: Payer: Self-pay | Admitting: Family Medicine

## 2022-01-12 ENCOUNTER — Ambulatory Visit (INDEPENDENT_AMBULATORY_CARE_PROVIDER_SITE_OTHER): Payer: 59 | Admitting: Family Medicine

## 2022-01-12 VITALS — BP 155/95 | HR 89 | Temp 98.0°F | Resp 16 | Ht 63.0 in | Wt 201.0 lb

## 2022-01-12 DIAGNOSIS — R079 Chest pain, unspecified: Secondary | ICD-10-CM

## 2022-01-12 DIAGNOSIS — E7800 Pure hypercholesterolemia, unspecified: Secondary | ICD-10-CM

## 2022-01-12 DIAGNOSIS — E559 Vitamin D deficiency, unspecified: Secondary | ICD-10-CM

## 2022-01-12 DIAGNOSIS — F341 Dysthymic disorder: Secondary | ICD-10-CM

## 2022-01-12 DIAGNOSIS — R739 Hyperglycemia, unspecified: Secondary | ICD-10-CM | POA: Diagnosis not present

## 2022-01-12 DIAGNOSIS — J4520 Mild intermittent asthma, uncomplicated: Secondary | ICD-10-CM

## 2022-01-12 DIAGNOSIS — I10 Essential (primary) hypertension: Secondary | ICD-10-CM

## 2022-01-12 DIAGNOSIS — R0789 Other chest pain: Secondary | ICD-10-CM

## 2022-01-12 LAB — LIPID PANEL
Cholesterol: 186 mg/dL (ref 0–200)
HDL: 56.6 mg/dL (ref >39.00)
LDL Cholesterol: 112 mg/dL — ABNORMAL HIGH (ref 0–99)
NonHDL: 128.91
Total CHOL/HDL Ratio: 3
Triglycerides: 83 mg/dL (ref 0.0–149.0)
VLDL: 16.6 mg/dL (ref 0.0–40.0)

## 2022-01-12 LAB — CBC
HCT: 36.5 % (ref 36.0–46.0)
Hemoglobin: 11.8 g/dL — ABNORMAL LOW (ref 12.0–15.0)
MCHC: 32.5 g/dL (ref 30.0–36.0)
MCV: 84.9 fl (ref 78.0–100.0)
Platelets: 261 10*3/uL (ref 150.0–400.0)
RBC: 4.3 Mil/uL (ref 3.87–5.11)
RDW: 13.2 % (ref 11.5–15.5)
WBC: 7.5 10*3/uL (ref 4.0–10.5)

## 2022-01-12 LAB — COMPREHENSIVE METABOLIC PANEL
ALT: 11 U/L (ref 0–35)
AST: 13 U/L (ref 0–37)
Albumin: 4.3 g/dL (ref 3.5–5.2)
Alkaline Phosphatase: 128 U/L — ABNORMAL HIGH (ref 39–117)
BUN: 9 mg/dL (ref 6–23)
CO2: 27 mEq/L (ref 19–32)
Calcium: 9.2 mg/dL (ref 8.4–10.5)
Chloride: 105 mEq/L (ref 96–112)
Creatinine, Ser: 0.69 mg/dL (ref 0.40–1.20)
GFR: 111.37 mL/min (ref >60.00)
Glucose, Bld: 73 mg/dL (ref 70–99)
Potassium: 3.9 mEq/L (ref 3.5–5.1)
Sodium: 141 mEq/L (ref 135–145)
Total Bilirubin: 0.4 mg/dL (ref 0.2–1.2)
Total Protein: 7.2 g/dL (ref 6.0–8.3)

## 2022-01-12 LAB — VITAMIN D 25 HYDROXY (VIT D DEFICIENCY, FRACTURES): VITD: 25.77 ng/mL — ABNORMAL LOW (ref 30.00–100.00)

## 2022-01-12 LAB — TSH: TSH: 0.82 u[IU]/mL (ref 0.35–5.50)

## 2022-01-12 LAB — HEMOGLOBIN A1C: Hgb A1c MFr Bld: 6.1 % (ref 4.6–6.5)

## 2022-01-12 MED ORDER — VITAMIN D (ERGOCALCIFEROL) 1.25 MG (50000 UNIT) PO CAPS: 50000.0000 [IU] | ORAL_CAPSULE | ORAL | 4 refills | Status: DC

## 2022-01-12 NOTE — Assessment & Plan Note (Signed)
No recent exacerbations.  

## 2022-01-12 NOTE — Patient Instructions (Signed)
Hypertension, Adult High blood pressure (hypertension) is when the force of blood pumping through the arteries is too strong. The arteries are the blood vessels that carry blood from the heart throughout the body. Hypertension forces the heart to work harder to pump blood and may cause arteries to become narrow or stiff. Untreated or uncontrolled hypertension can lead to a heart attack, heart failure, a stroke, kidney disease, and other problems. A blood pressure reading consists of a higher number over a lower number. Ideally, your blood pressure should be below 120/80. The first ("top") number is called the systolic pressure. It is a measure of the pressure in your arteries as your heart beats. The second ("bottom") number is called the diastolic pressure. It is a measure of the pressure in your arteries as the heart relaxes. What are the causes? The exact cause of this condition is not known. There are some conditions that result in high blood pressure. What increases the risk? Certain factors may make you more likely to develop high blood pressure. Some of these risk factors are under your control, including: Smoking. Not getting enough exercise or physical activity. Being overweight. Having too much fat, sugar, calories, or salt (sodium) in your diet. Drinking too much alcohol. Other risk factors include: Having a personal history of heart disease, diabetes, high cholesterol, or kidney disease. Stress. Having a family history of high blood pressure and high cholesterol. Having obstructive sleep apnea. Age. The risk increases with age. What are the signs or symptoms? High blood pressure may not cause symptoms. Very high blood pressure (hypertensive crisis) may cause: Headache. Fast or irregular heartbeats (palpitations). Shortness of breath. Nosebleed. Nausea and vomiting. Vision changes. Severe chest pain, dizziness, and seizures. How is this diagnosed? This condition is diagnosed by  measuring your blood pressure while you are seated, with your arm resting on a flat surface, your legs uncrossed, and your feet flat on the floor. The cuff of the blood pressure monitor will be placed directly against the skin of your upper arm at the level of your heart. Blood pressure should be measured at least twice using the same arm. Certain conditions can cause a difference in blood pressure between your right and left arms. If you have a high blood pressure reading during one visit or you have normal blood pressure with other risk factors, you may be asked to: Return on a different day to have your blood pressure checked again. Monitor your blood pressure at home for 1 week or longer. If you are diagnosed with hypertension, you may have other blood or imaging tests to help your health care provider understand your overall risk for other conditions. How is this treated? This condition is treated by making healthy lifestyle changes, such as eating healthy foods, exercising more, and reducing your alcohol intake. You may be referred for counseling on a healthy diet and physical activity. Your health care provider may prescribe medicine if lifestyle changes are not enough to get your blood pressure under control and if: Your systolic blood pressure is above 130. Your diastolic blood pressure is above 80. Your personal target blood pressure may vary depending on your medical conditions, your age, and other factors. Follow these instructions at home: Eating and drinking  Eat a diet that is high in fiber and potassium, and low in sodium, added sugar, and fat. An example of this eating plan is called the DASH diet. DASH stands for Dietary Approaches to Stop Hypertension. To eat this way: Eat   plenty of fresh fruits and vegetables. Try to fill one half of your plate at each meal with fruits and vegetables. Eat whole grains, such as whole-wheat pasta, brown rice, or whole-grain bread. Fill about one  fourth of your plate with whole grains. Eat or drink low-fat dairy products, such as skim milk or low-fat yogurt. Avoid fatty cuts of meat, processed or cured meats, and poultry with skin. Fill about one fourth of your plate with lean proteins, such as fish, chicken without skin, beans, eggs, or tofu. Avoid pre-made and processed foods. These tend to be higher in sodium, added sugar, and fat. Reduce your daily sodium intake. Many people with hypertension should eat less than 1,500 mg of sodium a day. Do not drink alcohol if: Your health care provider tells you not to drink. You are pregnant, may be pregnant, or are planning to become pregnant. If you drink alcohol: Limit how much you have to: 0-1 drink a day for women. 0-2 drinks a day for men. Know how much alcohol is in your drink. In the [place] one drink equals one 12 oz bottle of beer (355 mL), one 5 oz glass of wine (148 mL), or one 1 oz glass of hard liquor (44 mL). Lifestyle  Work with your health care provider to maintain a healthy body weight or to lose weight. Ask what an ideal weight is for you. Get at least 30 minutes of exercise that causes your heart to beat faster (aerobic exercise) most days of the week. Activities may include walking, swimming, or biking. Include exercise to strengthen your muscles (resistance exercise), such as Pilates or lifting weights, as part of your weekly exercise routine. Try to do these types of exercises for 30 minutes at least 3 days a week. Do not use any products that contain nicotine or tobacco. These products include cigarettes, chewing tobacco, and vaping devices, such as e-cigarettes. If you need help quitting, ask your health care provider. Monitor your blood pressure at home as told by your health care provider. Keep all follow-up visits. This is important. Medicines Take over-the-counter and prescription medicines only as told by your health care provider. Follow directions carefully. Blood  pressure medicines must be taken as prescribed. Do not skip doses of blood pressure medicine. Doing this puts you at risk for problems and can make the medicine less effective. Ask your health care provider about side effects or reactions to medicines that you should watch for. Contact a health care provider if you: Think you are having a reaction to a medicine you are taking. Have headaches that keep coming back (recurring). Feel dizzy. Have swelling in your ankles. Have trouble with your vision. Get help right away if you: Develop a severe headache or confusion. Have unusual weakness or numbness. Feel faint. Have severe pain in your chest or abdomen. Vomit repeatedly. Have trouble breathing. These symptoms may be an emergency. Get help right away. Call 911. Do not wait to see if the symptoms will go away. Do not drive yourself to the hospital. Summary Hypertension is when the force of blood pumping through your arteries is too strong. If this condition is not controlled, it may put you at risk for serious complications. Your personal target blood pressure may vary depending on your medical conditions, your age, and other factors. For most people, a normal blood pressure is less than 120/80. Hypertension is treated with lifestyle changes, medicines, or a combination of both. Lifestyle changes include losing weight, eating a healthy,   low-sodium diet, exercising more, and limiting alcohol. This information is not intended to replace advice given to you by your health care provider. Make sure you discuss any questions you have with your health care provider. Document Revised: 03/03/2021 Document Reviewed: 03/03/2021 Elsevier Patient Education  2023 [employer]  

## 2022-01-12 NOTE — Progress Notes (Signed)
Subjective:   By signing my name below, I, Vickey Sages, attest that this documentation has been prepared under the direction and in the presence of Bradd Canary, MD 01/12/2022.     Patient ID: Megan Hunt, female    DOB: 08/08/1985, 36 y.o.   MRN: [id number]  No chief complaint on file.  HPI Patient is in today for an office visit.  Blood pressure: Her blood pressure is elevated today. She is currently taking Amlodipine 10 mg but reports that she has forgotten to take it recently due to stress.  BP Readings from Last 3 Encounters:  01/12/22 (!) 155/95  02/24/21 132/74  11/21/20 (!) 162/109   Chest pain: She complains of chest discomfort today that started approximately 2 months ago. She describes having a sharp, shooting pain that comes and goes multiple times a day. This pain lasts a few minutes and subsides on its own. She further describes having a tight, pressure sensation several times a week that last for 15-20 minutes. She says that this pressure is followed by a shortness of breath and comes more often when she is active.   Anxiety/ Depression: She expresses hesitancy with counseling and taking medications to manage her anxiety/depression.   Heartburn: She complains of heartburn several times a week. She states that the heartburn does not occur at the same time as her chest discomfort.   Vitamin D: She is currently taking Vitamin D 50,000 IU once weekly.   Past Medical History:  Diagnosis Date   Allergic rhinitis    Anemia    Anxiety and depression    Asthma, mild intermittent 10/27/2008   Qualifier: Diagnosis of  By: Kriste Basque MD, Lonzo Cloud    Atypical chest pain    Back pain    Constipation    History of migraines    Hypercholesteremia    Hypertension    no longer taking meds   Insomnia 05/15/2016   Somatic dysfunction    Past Surgical History:  Procedure Laterality Date   TUBAL LIGATION Bilateral 11/28/2019   Procedure: POST PARTUM TUBAL LIGATION;  Surgeon:  Federico Flake, MD;  Location: [facility];  Service: Gynecology;  Laterality: Bilateral;   WISDOM TOOTH EXTRACTION     Family History  Problem Relation Age of Onset   Hypertension Father    Stroke Father        poor speech, weakness   Hypertension Mother    Arthritis Brother    Drug abuse Daughter    Stroke Maternal Grandfather    Hypertension Maternal Grandfather    Cancer Sister    Birth defects Paternal Aunt    Social History   Socioeconomic History   Marital status: Married    Spouse name: Not on file   Number of children: 2   Years of education: Not on file   Highest education level: Not on file  Occupational History   Occupation: [employer]    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    Comment: grad [employer] degree in social work  Tobacco Use   Smoking status: Never   Smokeless tobacco: Never  Vaping Use   Vaping Use: Never used  Substance and Sexual Activity   Alcohol use: No   Drug use: No   Sexual activity: Not Currently    Birth control/protection: Surgical    Comment: lives with husband and 3 small children, no dietary restrictions, stays at home  Other Topics Concern   Not on file  Social History  Narrative   Son [name] born 02/2008 and daughter [name] born 06/2009   Ivin Booty born 03/22/2013   Social Determinants of Health   Financial Resource Strain: Not on file  Food Insecurity: Not on file  Transportation Needs: Not on file  Physical Activity: Not on file  Stress: Not on file  Social Connections: Not on file  Intimate Partner Violence: Not on file   Outpatient Medications Prior to Visit  Medication Sig Dispense Refill   amLODipine (NORVASC) 10 MG tablet TAKE 1 TABLET BY MOUTH EVERY DAY 90 tablet 3   cetirizine (ZYRTEC) 10 MG tablet Take 1 tablet (10 mg total) by mouth daily. 90 tablet 1   diphenhydramine-acetaminophen (TYLENOL PM) 25-500 MG TABS tablet Take 1 tablet by mouth at bedtime as needed.     famotidine (PEPCID) 20 MG tablet Take 1  tablet (20 mg total) by mouth daily. 180 tablet 1   fluticasone (FLONASE) 50 MCG/ACT nasal spray Place 2 sprays into both nostrils daily. 16 mL 0   neomycin-polymyxin-hydrocortisone (CORTISPORIN) 3.5-10000-1 OTIC suspension Place 4 drops into both ears 3 (three) times daily. 10 mL 0   Vitamin D, Ergocalciferol, (DRISDOL) 1.25 MG (50000 UNIT) CAPS capsule TAKE 1 CAPSULE (50,000 UNITS TOTAL) BY MOUTH EVERY 7 (SEVEN) DAYS 4 capsule 4   No facility-administered medications prior to visit.   No Known Allergies  Review of Systems  Cardiovascular:        (+) Chest discomfort.       Objective:    Physical Exam Constitutional:      General: She is not in acute distress.    Appearance: Normal appearance. She is not ill-appearing.  HENT:     Head: Normocephalic and atraumatic.     Right Ear: External ear normal.     Left Ear: External ear normal.     Mouth/Throat:     Mouth: Mucous membranes are moist.     Pharynx: Oropharynx is clear.  Eyes:     Extraocular Movements: Extraocular movements intact.     Pupils: Pupils are equal, round, and reactive to light.  Cardiovascular:     Rate and Rhythm: Normal rate and regular rhythm.     Pulses: Normal pulses.     Heart sounds: Normal heart sounds. No murmur heard.    No gallop.  Pulmonary:     Effort: Pulmonary effort is normal. No respiratory distress.     Breath sounds: Normal breath sounds. No wheezing or rales.  Abdominal:     General: Bowel sounds are normal.  Skin:    General: Skin is warm and dry.  Neurological:     Mental Status: She is alert and oriented to person, place, and time.  Psychiatric:        Mood and Affect: Mood normal.        Behavior: Behavior normal.        Judgment: Judgment normal.    There were no vitals taken for this visit. Wt Readings from Last 3 Encounters:  02/24/21 192 lb 12.8 oz (87.5 kg)  08/12/20 207 lb 6.4 oz (94.1 kg)  05/01/20 215 lb (97.5 kg)   Diabetic Foot Exam - Simple   No data  filed    Lab Results  Component Value Date   WBC 8.6 02/24/2021   HGB 12.0 02/24/2021   HCT 36.8 02/24/2021   PLT 269.0 02/24/2021   GLUCOSE 84 02/24/2021   CHOL 220 (H) 02/24/2021   TRIG 146.0 02/24/2021   HDL 65.90 02/24/2021  LDLDIRECT 177.4 06/15/2013   LDLCALC 125 (H) 02/24/2021   ALT 11 02/24/2021   AST 13 02/24/2021   NA 136 02/24/2021   K 4.4 02/24/2021   CL 101 02/24/2021   CREATININE 0.65 02/24/2021   BUN 10 02/24/2021   CO2 26 02/24/2021   TSH 1.40 02/24/2021   HGBA1C 5.7 02/24/2021   Lab Results  Component Value Date   TSH 1.40 02/24/2021   Lab Results  Component Value Date   WBC 8.6 02/24/2021   HGB 12.0 02/24/2021   HCT 36.8 02/24/2021   MCV 83.4 02/24/2021   PLT 269.0 02/24/2021   Lab Results  Component Value Date   NA 136 02/24/2021   K 4.4 02/24/2021   CO2 26 02/24/2021   GLUCOSE 84 02/24/2021   BUN 10 02/24/2021   CREATININE 0.65 02/24/2021   BILITOT 0.4 02/24/2021   ALKPHOS 143 (H) 02/24/2021   AST 13 02/24/2021   ALT 11 02/24/2021   PROT 7.3 02/24/2021   ALBUMIN 4.6 02/24/2021   CALCIUM 9.5 02/24/2021   ANIONGAP 11 12/06/2019   GFR 113.69 02/24/2021   Lab Results  Component Value Date   CHOL 220 (H) 02/24/2021   Lab Results  Component Value Date   HDL 65.90 02/24/2021   Lab Results  Component Value Date   LDLCALC 125 (H) 02/24/2021   Lab Results  Component Value Date   TRIG 146.0 02/24/2021   Lab Results  Component Value Date   CHOLHDL 3 02/24/2021   Lab Results  Component Value Date   HGBA1C 5.7 02/24/2021      Assessment & Plan:   Problem List Items Addressed This Visit       Cardiovascular and Mediastinum   HTN (hypertension)     Other   HYPERCHOLESTEROLEMIA   ANXIETY DEPRESSION   Vitamin D deficiency   No orders of the defined types were placed in this encounter.  I, Vickey Sages, personally preformed the services described in this documentation.  All medical record entries made by the scribe  were at my direction and in my presence.  I have reviewed the chart and discharge instructions (if applicable) and agree that the record reflects my personal performance and is accurate and complete. 01/12/2022  I,Mohammed Iqbal,acting as a scribe for Danise Edge, MD.,have documented all relevant documentation on the behalf of Danise Edge, MD,as directed by  Danise Edge, MD while in the presence of Danise Edge, MD.  Vickey Sages

## 2022-01-12 NOTE — Assessment & Plan Note (Signed)
hgba1c acceptable, minimize simple carbs. Increase exercise as tolerated.  

## 2022-01-12 NOTE — Assessment & Plan Note (Addendum)
Has episodes of tightness or pressure a couple times a week. Does not wake her up. She can feel SOB at times, she has baseline nausea but not necessarily any worse. Pressure lasts about 15 minutes and resolves spontaneously. No diaphoresis. She also has sharp fleeting chest pains a couples times a day without any associated symptoms. EKG and labs today. Offered cardiology referral but declines for now, EKG normal today

## 2022-01-15 ENCOUNTER — Encounter: Payer: Self-pay | Admitting: Family Medicine

## 2022-02-02 ENCOUNTER — Ambulatory Visit (INDEPENDENT_AMBULATORY_CARE_PROVIDER_SITE_OTHER): Payer: 59

## 2022-02-02 ENCOUNTER — Telehealth: Payer: Self-pay

## 2022-02-02 DIAGNOSIS — I10 Essential (primary) hypertension: Secondary | ICD-10-CM

## 2022-02-02 NOTE — Telephone Encounter (Signed)
Pt stated she would like something for anxiety as discussed at last visit please advise

## 2022-02-02 NOTE — Progress Notes (Signed)
Pt here for Blood pressure check per Dr.Blyth  Pt currently takes: Amlodipine 10 mg   Pt reports compliance with medication.  BP today @ =138/84 HR = 93 bpm  Pt advised per no changes.

## 2022-02-03 NOTE — Telephone Encounter (Signed)
Sent a my chart message.

## 2022-02-08 ENCOUNTER — Ambulatory Visit: Payer: Self-pay | Admitting: Family Medicine

## 2022-02-09 ENCOUNTER — Other Ambulatory Visit: Payer: Self-pay

## 2022-02-09 MED ORDER — FLUOXETINE HCL 20 MG PO CAPS: ORAL_CAPSULE | ORAL | 3 refills | Status: DC

## 2022-02-09 NOTE — Telephone Encounter (Signed)
Called pt was advised and appt made

## 2022-03-05 ENCOUNTER — Other Ambulatory Visit: Payer: Self-pay | Admitting: Family Medicine

## 2022-03-15 ENCOUNTER — Telehealth (INDEPENDENT_AMBULATORY_CARE_PROVIDER_SITE_OTHER): Payer: 59 | Admitting: Family Medicine

## 2022-03-15 DIAGNOSIS — F341 Dysthymic disorder: Secondary | ICD-10-CM

## 2022-03-15 MED ORDER — FLUOXETINE HCL 20 MG PO CAPS: 20.0000 mg | ORAL_CAPSULE | Freq: Every day | ORAL | 2 refills | Status: DC

## 2022-03-15 MED ORDER — ALPRAZOLAM 0.25 MG PO TABS: 0.2500 mg | ORAL_TABLET | Freq: Two times a day (BID) | ORAL | 0 refills | Status: DC | PRN

## 2022-03-15 NOTE — Progress Notes (Signed)
MyChart Video Visit    Virtual Visit via Video Note   This visit type was conducted due to national recommendations for restrictions regarding the COVID-19 Pandemic (e.g. social distancing) in an effort to limit this patient's exposure and mitigate transmission in our community. This patient is at least at moderate risk for complications without adequate follow up. This format is felt to be most appropriate for this patient at this time. Physical exam was limited by quality of the video and audio technology used for the visit. CMA was able to get the patient set up on a video visit.  Patient location: Home Patient and provider in visit Provider location: Office  I discussed the limitations of evaluation and management by telemedicine and the availability of in person appointments. The patient expressed understanding and agreed to proceed.  Visit Date: 03/15/2022.  Today's healthcare provider: Danise Edge, MD     Subjective:    Patient ID: Megan Hunt, female    DOB: April 23, 1986, 36 y.o.   MRN: [id number]  No chief complaint on file.   HPI Patient is in today for a virtual visit.  Anxiety and depression Patient reports that she might be dealing with anxiety and/or depression because she has no energy, she wants to sleep frequently, feels tired even after sleeping, and states she can be very happy then the next moment sad. She also states her mood is affected by changes, activities, and stressors going on in her life at the moment. Patient is currently complaint with her 20 mg Prozac medication and reports she is tolerating it well with no adverse side effects. She reports that she is open to counseling for this issue. She states that her condition worsens when the weather is colder and darker.  Past Medical History:  Diagnosis Date   Allergic rhinitis    Anemia    Anxiety and depression    Asthma, mild intermittent 10/27/2008   Qualifier: Diagnosis of  By: Kriste Basque MD, Lonzo Cloud     Atypical chest pain    Back pain    Constipation    History of migraines    Hypercholesteremia    Hypertension    no longer taking meds   Insomnia 05/15/2016   Somatic dysfunction     Past Surgical History:  Procedure Laterality Date   TUBAL LIGATION Bilateral 11/28/2019   Procedure: POST PARTUM TUBAL LIGATION;  Surgeon: Federico Flake, MD;  Location: [facility];  Service: Gynecology;  Laterality: Bilateral;   WISDOM TOOTH EXTRACTION      Family History  Problem Relation Age of Onset   Hypertension Father    Stroke Father        poor speech, weakness   Hypertension Mother    Arthritis Brother    Drug abuse Daughter    Stroke Maternal Grandfather    Hypertension Maternal Grandfather    Cancer Sister    Birth defects Paternal Aunt     Social History   Socioeconomic History   Marital status: Married    Spouse name: Not on file   Number of children: 2   Years of education: Not on file   Highest education level: Not on file  Occupational History   Occupation: [employer]    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    Comment: grad [employer] degree in social work  Tobacco Use   Smoking status: Never   Smokeless tobacco: Never  Vaping Use   Vaping Use: Never used  Substance and  Sexual Activity   Alcohol use: No   Drug use: No   Sexual activity: Not Currently    Birth control/protection: Surgical    Comment: lives with husband and 3 small children, no dietary restrictions, stays at home  Other Topics Concern   Not on file  Social History Narrative   Son [place] born 02/2008 and daughter Eulogio Ditch born 06/2009   Vonna Kotyk born 03/22/2013   Social Determinants of Health   Financial Resource Strain: Not on file  Food Insecurity: Not on file  Transportation Needs: Not on file  Physical Activity: Not on file  Stress: Not on file  Social Connections: Not on file  Intimate Partner Violence: Not on file    Outpatient Medications Prior to Visit  Medication Sig  Dispense Refill   amLODipine (NORVASC) 10 MG tablet TAKE 1 TABLET BY MOUTH EVERY DAY 90 tablet 3   cetirizine (ZYRTEC) 10 MG tablet Take 1 tablet (10 mg total) by mouth daily. 90 tablet 1   diphenhydramine-acetaminophen (TYLENOL PM) 25-500 MG TABS tablet Take 1 tablet by mouth at bedtime as needed.     famotidine (PEPCID) 20 MG tablet Take 1 tablet (20 mg total) by mouth daily. 180 tablet 1   FLUoxetine (PROZAC) 20 MG capsule 1/2 TAB DAILY FOR 1 WEEK THEN INCREASE TO 1 TAB DAILY 90 capsule 2   fluticasone (FLONASE) 50 MCG/ACT nasal spray Place 2 sprays into both nostrils daily. 16 mL 0   Vitamin D, Ergocalciferol, (DRISDOL) 1.25 MG (50000 UNIT) CAPS capsule Take 1 capsule (50,000 Units total) by mouth every 7 (seven) days. 4 capsule 4   No facility-administered medications prior to visit.    No Known Allergies  Review of Systems  Psychiatric/Behavioral:  Positive for depression. The patient is nervous/anxious.        Objective:    Physical Exam  There were no vitals taken for this visit. Wt Readings from Last 3 Encounters:  01/12/22 201 lb (91.2 kg)  02/24/21 192 lb 12.8 oz (87.5 kg)  08/12/20 207 lb 6.4 oz (94.1 kg)    Diabetic Foot Exam - Simple   No data filed    Lab Results  Component Value Date   WBC 7.5 01/12/2022   HGB 11.8 (L) 01/12/2022   HCT 36.5 01/12/2022   PLT 261.0 01/12/2022   GLUCOSE 73 01/12/2022   CHOL 186 01/12/2022   TRIG 83.0 01/12/2022   HDL 56.60 01/12/2022   LDLDIRECT 177.4 06/15/2013   LDLCALC 112 (H) 01/12/2022   ALT 11 01/12/2022   AST 13 01/12/2022   NA 141 01/12/2022   K 3.9 01/12/2022   CL 105 01/12/2022   CREATININE 0.69 01/12/2022   BUN 9 01/12/2022   CO2 27 01/12/2022   TSH 0.82 01/12/2022   HGBA1C 6.1 01/12/2022    Lab Results  Component Value Date   TSH 0.82 01/12/2022   Lab Results  Component Value Date   WBC 7.5 01/12/2022   HGB 11.8 (L) 01/12/2022   HCT 36.5 01/12/2022   MCV 84.9 01/12/2022   PLT 261.0  01/12/2022   Lab Results  Component Value Date   NA 141 01/12/2022   K 3.9 01/12/2022   CO2 27 01/12/2022   GLUCOSE 73 01/12/2022   BUN 9 01/12/2022   CREATININE 0.69 01/12/2022   BILITOT 0.4 01/12/2022   ALKPHOS 128 (H) 01/12/2022   AST 13 01/12/2022   ALT 11 01/12/2022   PROT 7.2 01/12/2022   ALBUMIN 4.3 01/12/2022   CALCIUM 9.2  01/12/2022   ANIONGAP 11 12/06/2019   GFR 111.37 01/12/2022   Lab Results  Component Value Date   CHOL 186 01/12/2022   Lab Results  Component Value Date   HDL 56.60 01/12/2022   Lab Results  Component Value Date   LDLCALC 112 (H) 01/12/2022   Lab Results  Component Value Date   TRIG 83.0 01/12/2022   Lab Results  Component Value Date   CHOLHDL 3 01/12/2022   Lab Results  Component Value Date   HGBA1C 6.1 01/12/2022       Assessment & Plan:   Problem List Items Addressed This Visit   None  No orders of the defined types were placed in this encounter.   I discussed the assessment and treatment plan with the patient. The patient was provided an opportunity to ask questions and all were answered. The patient agreed with the plan and demonstrated an understanding of the instructions.   The patient was advised to call back or seek an in-person evaluation if the symptoms worsen or if the condition fails to improve as anticipated.  I provided 20 minutes of face-to-face time during this encounter.   I,Verona Buck,acting as a Neurosurgeon for Danise Edge, MD.,have documented all relevant documentation on the behalf of Danise Edge, MD,as directed by  Danise Edge, MD while in the presence of Danise Edge, MD.   Danise Edge, MD [facility] [phone] (phone) [phone] (fax)  [facility]

## 2022-03-17 NOTE — Assessment & Plan Note (Signed)
She feels the medication adjustments have been some helpful so far. She is taking the Fluoxetine 20 mg caps 1 daily without side effects and maybe some improvement. Has needed Alprazolam infrequently but it has helped when she has needed it. Spent 22 minutes discussing current state, in counseling and plan of care

## 2022-04-11 NOTE — Assessment & Plan Note (Signed)
Encouraged good sleep hygiene such as dark, quiet room. No blue/Lyne glowing lights such as computer screens in bedroom. No alcohol or stimulants in evening. Cut down on caffeine as able. Regular exercise is helpful but not just prior to bed time.  

## 2022-04-11 NOTE — Assessment & Plan Note (Signed)
Supplement and monitor 

## 2022-04-11 NOTE — Assessment & Plan Note (Signed)
hgba1c acceptable, minimize simple carbs. Increase exercise as tolerated.  

## 2022-04-11 NOTE — Assessment & Plan Note (Signed)
Tolerating Fluoxetine and is improving. Alprazolam prn

## 2022-04-11 NOTE — Assessment & Plan Note (Signed)
encouraged heart healthy diet, avoid trans fats, minimize simple carbs and saturated fats. Increase exercise as tolerated 

## 2022-04-11 NOTE — Assessment & Plan Note (Signed)
Well controlled, no changes to meds. Encouraged heart healthy diet such as the DASH diet and exercise as tolerated. Monitor at home and report any concerns.  ?

## 2022-04-12 ENCOUNTER — Encounter: Payer: Self-pay | Admitting: Family Medicine

## 2022-04-12 ENCOUNTER — Ambulatory Visit (INDEPENDENT_AMBULATORY_CARE_PROVIDER_SITE_OTHER): Payer: 59 | Admitting: Family Medicine

## 2022-04-12 VITALS — BP 132/82 | HR 89 | Temp 98.0°F | Resp 16 | Ht 64.0 in | Wt 195.0 lb

## 2022-04-12 DIAGNOSIS — I10 Essential (primary) hypertension: Secondary | ICD-10-CM | POA: Diagnosis not present

## 2022-04-12 DIAGNOSIS — F341 Dysthymic disorder: Secondary | ICD-10-CM

## 2022-04-12 DIAGNOSIS — R739 Hyperglycemia, unspecified: Secondary | ICD-10-CM

## 2022-04-12 DIAGNOSIS — Z23 Encounter for immunization: Secondary | ICD-10-CM | POA: Diagnosis not present

## 2022-04-12 DIAGNOSIS — J4520 Mild intermittent asthma, uncomplicated: Secondary | ICD-10-CM

## 2022-04-12 DIAGNOSIS — G4700 Insomnia, unspecified: Secondary | ICD-10-CM

## 2022-04-12 DIAGNOSIS — E7800 Pure hypercholesterolemia, unspecified: Secondary | ICD-10-CM | POA: Diagnosis not present

## 2022-04-12 DIAGNOSIS — E559 Vitamin D deficiency, unspecified: Secondary | ICD-10-CM

## 2022-04-12 MED ORDER — ALPRAZOLAM 0.25 MG PO TABS: 0.2500 mg | ORAL_TABLET | Freq: Two times a day (BID) | ORAL | 1 refills | Status: DC | PRN

## 2022-04-12 NOTE — Progress Notes (Signed)
Subjective:    Patient ID: Megan Hunt, female    DOB: February 13, 1986, 36 y.o.   MRN: [id number]  Chief Complaint  Patient presents with   Follow-up    Follow up    HPI Patient is in today for follow up on chronic medical concerns. No recent febrile illness or acute hospitalizations. She thinks maybe the Fluoxetine is helping slightly but not fully sure. The Alprazolam helps but she uses it infrequently and usually she needs 2 when she needs it. Denies CP/palp/SOB/HA/congestion/fevers/GI or GU c/o. Taking meds as prescribed. She notes that her dentist commented on her gums seeming enlarged and that it might be the Amlodipine. No bleeding or pain in gums.   Past Medical History:  Diagnosis Date   Allergic rhinitis    Anemia    Anxiety and depression    Asthma, mild intermittent 10/27/2008   Qualifier: Diagnosis of  By: Kriste Basque MD, Lonzo Cloud    Atypical chest pain    Back pain    Constipation    History of migraines    Hypercholesteremia    Hypertension    no longer taking meds   Insomnia 05/15/2016   Somatic dysfunction     Past Surgical History:  Procedure Laterality Date   TUBAL LIGATION Bilateral 11/28/2019   Procedure: POST PARTUM TUBAL LIGATION;  Surgeon: Federico Flake, MD;  Location: [facility];  Service: Gynecology;  Laterality: Bilateral;   WISDOM TOOTH EXTRACTION      Family History  Problem Relation Age of Onset   Hypertension Father    Stroke Father        poor speech, weakness   Hypertension Mother    Arthritis Brother    Drug abuse Daughter    Stroke Maternal Grandfather    Hypertension Maternal Grandfather    Cancer Sister    Birth defects Paternal Aunt     Social History   Socioeconomic History   Marital status: Married    Spouse name: Not on file   Number of children: 2   Years of education: Not on file   Highest education level: Not on file  Occupational History   Occupation: [employer]    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     Comment: grad [employer] degree in social work  Tobacco Use   Smoking status: Never   Smokeless tobacco: Never  Vaping Use   Vaping Use: Never used  Substance and Sexual Activity   Alcohol use: No   Drug use: No   Sexual activity: Not Currently    Birth control/protection: Surgical    Comment: lives with husband and 3 small children, no dietary restrictions, stays at home  Other Topics Concern   Not on file  Social History Narrative   Son [name] born 02/2008 and daughter [name] born 06/2009   Ivin Booty born 03/22/2013   Social Determinants of Health   Financial Resource Strain: Not on file  Food Insecurity: Not on file  Transportation Needs: Not on file  Physical Activity: Not on file  Stress: Not on file  Social Connections: Not on file  Intimate Partner Violence: Not on file    Outpatient Medications Prior to Visit  Medication Sig Dispense Refill   amLODipine (NORVASC) 10 MG tablet TAKE 1 TABLET BY MOUTH EVERY DAY 90 tablet 3   cetirizine (ZYRTEC) 10 MG tablet Take 1 tablet (10 mg total) by mouth daily. 90 tablet 1   diphenhydramine-acetaminophen (TYLENOL PM) 25-500 MG TABS tablet Take 1 tablet  by mouth at bedtime as needed.     famotidine (PEPCID) 20 MG tablet Take 1 tablet (20 mg total) by mouth daily. 180 tablet 1   FLUoxetine (PROZAC) 20 MG capsule Take 1 capsule (20 mg total) by mouth daily. 90 capsule 2   fluticasone (FLONASE) 50 MCG/ACT nasal spray Place 2 sprays into both nostrils daily. 16 mL 0   Vitamin D, Ergocalciferol, (DRISDOL) 1.25 MG (50000 UNIT) CAPS capsule Take 1 capsule (50,000 Units total) by mouth every 7 (seven) days. 4 capsule 4   ALPRAZolam (XANAX) 0.25 MG tablet Take 1 tablet (0.25 mg total) by mouth 2 (two) times daily as needed for anxiety. 20 tablet 0   No facility-administered medications prior to visit.    No Known Allergies  Review of Systems  Constitutional:  Negative for fever and malaise/fatigue.  HENT:  Negative for congestion.   Eyes:   Negative for blurred vision.  Respiratory:  Negative for shortness of breath.   Cardiovascular:  Negative for chest pain, palpitations and leg swelling.  Gastrointestinal:  Negative for abdominal pain, blood in stool and nausea.  Genitourinary:  Negative for dysuria and frequency.  Musculoskeletal:  Negative for falls.  Skin:  Negative for rash.  Neurological:  Negative for dizziness, loss of consciousness and headaches.  Endo/Heme/Allergies:  Negative for environmental allergies.  Psychiatric/Behavioral:  Negative for depression. The patient is not nervous/anxious.        Objective:    Physical Exam Constitutional:      General: She is not in acute distress.    Appearance: She is well-developed.  HENT:     Head: Normocephalic and atraumatic.  Eyes:     Conjunctiva/sclera: Conjunctivae normal.  Neck:     Thyroid: No thyromegaly.  Cardiovascular:     Rate and Rhythm: Normal rate and regular rhythm.     Heart sounds: Normal heart sounds. No murmur heard. Pulmonary:     Effort: Pulmonary effort is normal. No respiratory distress.     Breath sounds: Normal breath sounds.  Abdominal:     General: Bowel sounds are normal. There is no distension.     Palpations: Abdomen is soft. There is no mass.     Tenderness: There is no abdominal tenderness.  Musculoskeletal:     Cervical back: Neck supple.  Lymphadenopathy:     Cervical: No cervical adenopathy.  Skin:    General: Skin is warm and dry.  Neurological:     Mental Status: She is alert and oriented to person, place, and time.  Psychiatric:        Behavior: Behavior normal.     BP 132/82   Pulse 89   Temp 98 F (36.7 C) (Oral)   Resp 16   Ht 5\' 4"  (1.626 m)   Wt 195 lb (88.5 kg)   SpO2 96%   BMI 33.47 kg/m  Wt Readings from Last 3 Encounters:  04/12/22 195 lb (88.5 kg)  01/12/22 201 lb (91.2 kg)  02/24/21 192 lb 12.8 oz (87.5 kg)    Diabetic Foot Exam - Simple   No data filed    Lab Results  Component  Value Date   WBC 7.5 01/12/2022   HGB 11.8 (L) 01/12/2022   HCT 36.5 01/12/2022   PLT 261.0 01/12/2022   GLUCOSE 73 01/12/2022   CHOL 186 01/12/2022   TRIG 83.0 01/12/2022   HDL 56.60 01/12/2022   LDLDIRECT 177.4 06/15/2013   LDLCALC 112 (H) 01/12/2022   ALT 11 01/12/2022  AST 13 01/12/2022   NA 141 01/12/2022   K 3.9 01/12/2022   CL 105 01/12/2022   CREATININE 0.69 01/12/2022   BUN 9 01/12/2022   CO2 27 01/12/2022   TSH 0.82 01/12/2022   HGBA1C 6.1 01/12/2022    Lab Results  Component Value Date   TSH 0.82 01/12/2022   Lab Results  Component Value Date   WBC 7.5 01/12/2022   HGB 11.8 (L) 01/12/2022   HCT 36.5 01/12/2022   MCV 84.9 01/12/2022   PLT 261.0 01/12/2022   Lab Results  Component Value Date   NA 141 01/12/2022   K 3.9 01/12/2022   CO2 27 01/12/2022   GLUCOSE 73 01/12/2022   BUN 9 01/12/2022   CREATININE 0.69 01/12/2022   BILITOT 0.4 01/12/2022   ALKPHOS 128 (H) 01/12/2022   AST 13 01/12/2022   ALT 11 01/12/2022   PROT 7.2 01/12/2022   ALBUMIN 4.3 01/12/2022   CALCIUM 9.2 01/12/2022   ANIONGAP 11 12/06/2019   GFR 111.37 01/12/2022   Lab Results  Component Value Date   CHOL 186 01/12/2022   Lab Results  Component Value Date   HDL 56.60 01/12/2022   Lab Results  Component Value Date   LDLCALC 112 (H) 01/12/2022   Lab Results  Component Value Date   TRIG 83.0 01/12/2022   Lab Results  Component Value Date   CHOLHDL 3 01/12/2022   Lab Results  Component Value Date   HGBA1C 6.1 01/12/2022       Assessment & Plan:   Problem List Items Addressed This Visit     HYPERCHOLESTEROLEMIA     encouraged heart healthy diet, avoid trans fats, minimize simple carbs and saturated fats. Increase exercise as tolerated       ANXIETY DEPRESSION    Tolerating Fluoxetine and is improving some although she is not sure it is much. Alprazolam prn is helping but often needs 2 when she needs it. No changes for now.       Relevant Medications    ALPRAZolam (XANAX) 0.25 MG tablet   HTN (hypertension) - Primary    Well controlled, no changes to meds. Encouraged heart healthy diet such as the DASH diet and exercise as tolerated.  Monitor at home and report any concerns.       Asthma, mild intermittent    No recent exacerbation      Insomnia    Encouraged good sleep hygiene such as dark, quiet room. No blue/[place] glowing lights such as computer screens in bedroom. No alcohol or stimulants in evening. Cut down on caffeine as able. Regular exercise is helpful but not just prior to bed time.        Hyperglycemia    hgba1c acceptable, minimize simple carbs. Increase exercise as tolerated.       Vitamin D deficiency    Supplement and monitor       Other Visit Diagnoses     Need for influenza vaccination       Relevant Orders   Flu Vaccine QUAD 6+ mos PF IM (Fluarix Quad PF) (Completed)       I have changed Emelina M. Rubendall's ALPRAZolam. I am also having her maintain her diphenhydramine-acetaminophen, famotidine, cetirizine, fluticasone, amLODipine, Vitamin D (Ergocalciferol), and FLUoxetine.  Meds ordered this encounter  Medications   ALPRAZolam (XANAX) 0.25 MG tablet    Sig: Take 1-2 tablets (0.25-0.5 mg total) by mouth 2 (two) times daily as needed for anxiety.    Dispense:  60 tablet  Refill:  1     Penni Homans, MD

## 2022-04-12 NOTE — Assessment & Plan Note (Signed)
No recent exacerbation 

## 2022-04-12 NOTE — Patient Instructions (Signed)
Hypertension, Adult High blood pressure (hypertension) is when the force of blood pumping through the arteries is too strong. The arteries are the blood vessels that carry blood from the heart throughout the body. Hypertension forces the heart to work harder to pump blood and may cause arteries to become narrow or stiff. Untreated or uncontrolled hypertension can lead to a heart attack, heart failure, a stroke, kidney disease, and other problems. A blood pressure reading consists of a higher number over a lower number. Ideally, your blood pressure should be below 120/80. The first ("top") number is called the systolic pressure. It is a measure of the pressure in your arteries as your heart beats. The second ("bottom") number is called the diastolic pressure. It is a measure of the pressure in your arteries as the heart relaxes. What are the causes? The exact cause of this condition is not known. There are some conditions that result in high blood pressure. What increases the risk? Certain factors may make you more likely to develop high blood pressure. Some of these risk factors are under your control, including: Smoking. Not getting enough exercise or physical activity. Being overweight. Having too much fat, sugar, calories, or salt (sodium) in your diet. Drinking too much alcohol. Other risk factors include: Having a personal history of heart disease, diabetes, high cholesterol, or kidney disease. Stress. Having a family history of high blood pressure and high cholesterol. Having obstructive sleep apnea. Age. The risk increases with age. What are the signs or symptoms? High blood pressure may not cause symptoms. Very high blood pressure (hypertensive crisis) may cause: Headache. Fast or irregular heartbeats (palpitations). Shortness of breath. Nosebleed. Nausea and vomiting. Vision changes. Severe chest pain, dizziness, and seizures. How is this diagnosed? This condition is diagnosed by  measuring your blood pressure while you are seated, with your arm resting on a flat surface, your legs uncrossed, and your feet flat on the floor. The cuff of the blood pressure monitor will be placed directly against the skin of your upper arm at the level of your heart. Blood pressure should be measured at least twice using the same arm. Certain conditions can cause a difference in blood pressure between your right and left arms. If you have a high blood pressure reading during one visit or you have normal blood pressure with other risk factors, you may be asked to: Return on a different day to have your blood pressure checked again. Monitor your blood pressure at home for 1 week or longer. If you are diagnosed with hypertension, you may have other blood or imaging tests to help your health care provider understand your overall risk for other conditions. How is this treated? This condition is treated by making healthy lifestyle changes, such as eating healthy foods, exercising more, and reducing your alcohol intake. You may be referred for counseling on a healthy diet and physical activity. Your health care provider may prescribe medicine if lifestyle changes are not enough to get your blood pressure under control and if: Your systolic blood pressure is above 130. Your diastolic blood pressure is above 80. Your personal target blood pressure may vary depending on your medical conditions, your age, and other factors. Follow these instructions at home: Eating and drinking  Eat a diet that is high in fiber and potassium, and low in sodium, added sugar, and fat. An example of this eating plan is called the DASH diet. DASH stands for Dietary Approaches to Stop Hypertension. To eat this way: Eat   plenty of fresh fruits and vegetables. Try to fill one half of your plate at each meal with fruits and vegetables. Eat whole grains, such as whole-wheat pasta, brown rice, or whole-grain bread. Fill about one  fourth of your plate with whole grains. Eat or drink low-fat dairy products, such as skim milk or low-fat yogurt. Avoid fatty cuts of meat, processed or cured meats, and poultry with skin. Fill about one fourth of your plate with lean proteins, such as fish, chicken without skin, beans, eggs, or tofu. Avoid pre-made and processed foods. These tend to be higher in sodium, added sugar, and fat. Reduce your daily sodium intake. Many people with hypertension should eat less than 1,500 mg of sodium a day. Do not drink alcohol if: Your health care provider tells you not to drink. You are pregnant, may be pregnant, or are planning to become pregnant. If you drink alcohol: Limit how much you have to: 0-1 drink a day for women. 0-2 drinks a day for men. Know how much alcohol is in your drink. In the [place] one drink equals one 12 oz bottle of beer (355 mL), one 5 oz glass of wine (148 mL), or one 1 oz glass of hard liquor (44 mL). Lifestyle  Work with your health care provider to maintain a healthy body weight or to lose weight. Ask what an ideal weight is for you. Get at least 30 minutes of exercise that causes your heart to beat faster (aerobic exercise) most days of the week. Activities may include walking, swimming, or biking. Include exercise to strengthen your muscles (resistance exercise), such as Pilates or lifting weights, as part of your weekly exercise routine. Try to do these types of exercises for 30 minutes at least 3 days a week. Do not use any products that contain nicotine or tobacco. These products include cigarettes, chewing tobacco, and vaping devices, such as e-cigarettes. If you need help quitting, ask your health care provider. Monitor your blood pressure at home as told by your health care provider. Keep all follow-up visits. This is important. Medicines Take over-the-counter and prescription medicines only as told by your health care provider. Follow directions carefully. Blood  pressure medicines must be taken as prescribed. Do not skip doses of blood pressure medicine. Doing this puts you at risk for problems and can make the medicine less effective. Ask your health care provider about side effects or reactions to medicines that you should watch for. Contact a health care provider if you: Think you are having a reaction to a medicine you are taking. Have headaches that keep coming back (recurring). Feel dizzy. Have swelling in your ankles. Have trouble with your vision. Get help right away if you: Develop a severe headache or confusion. Have unusual weakness or numbness. Feel faint. Have severe pain in your chest or abdomen. Vomit repeatedly. Have trouble breathing. These symptoms may be an emergency. Get help right away. Call 911. Do not wait to see if the symptoms will go away. Do not drive yourself to the hospital. Summary Hypertension is when the force of blood pumping through your arteries is too strong. If this condition is not controlled, it may put you at risk for serious complications. Your personal target blood pressure may vary depending on your medical conditions, your age, and other factors. For most people, a normal blood pressure is less than 120/80. Hypertension is treated with lifestyle changes, medicines, or a combination of both. Lifestyle changes include losing weight, eating a healthy,   low-sodium diet, exercising more, and limiting alcohol. This information is not intended to replace advice given to you by your health care provider. Make sure you discuss any questions you have with your health care provider. Document Revised: 03/03/2021 Document Reviewed: 03/03/2021 Elsevier Patient Education  2023 [employer]  

## 2022-05-10 ENCOUNTER — Encounter: Payer: Self-pay | Admitting: Family Medicine

## 2022-05-11 ENCOUNTER — Other Ambulatory Visit: Payer: Self-pay | Admitting: Family Medicine

## 2022-05-11 MED ORDER — FLUOXETINE HCL 20 MG PO CAPS: 40.0000 mg | ORAL_CAPSULE | Freq: Every day | ORAL | 1 refills | Status: DC

## 2022-07-21 NOTE — Assessment & Plan Note (Signed)
Encourage heart healthy diet such as MIND or DASH diet, increase exercise, avoid trans fats, simple carbohydrates and processed foods, consider a krill or fish or flaxseed oil cap daily.  °

## 2022-07-21 NOTE — Assessment & Plan Note (Signed)
Supplement and monitor 

## 2022-07-21 NOTE — Assessment & Plan Note (Signed)
hgba1c acceptable, minimize simple carbs. Increase exercise as tolerated.  

## 2022-07-21 NOTE — Assessment & Plan Note (Addendum)
Monitor and report any concerns, no changes to meds. Encouraged heart healthy diet such as the DASH diet and exercise as tolerated.  Monitor at home and report any concerns.

## 2022-07-21 NOTE — Assessment & Plan Note (Signed)
No recent exacerbation 

## 2022-07-22 ENCOUNTER — Telehealth (INDEPENDENT_AMBULATORY_CARE_PROVIDER_SITE_OTHER): Payer: 59 | Admitting: Family Medicine

## 2022-07-22 ENCOUNTER — Other Ambulatory Visit: Payer: Self-pay

## 2022-07-22 DIAGNOSIS — F341 Dysthymic disorder: Secondary | ICD-10-CM

## 2022-07-22 DIAGNOSIS — J4520 Mild intermittent asthma, uncomplicated: Secondary | ICD-10-CM

## 2022-07-22 DIAGNOSIS — E7800 Pure hypercholesterolemia, unspecified: Secondary | ICD-10-CM

## 2022-07-22 DIAGNOSIS — E559 Vitamin D deficiency, unspecified: Secondary | ICD-10-CM

## 2022-07-22 DIAGNOSIS — R739 Hyperglycemia, unspecified: Secondary | ICD-10-CM

## 2022-07-22 DIAGNOSIS — D509 Iron deficiency anemia, unspecified: Secondary | ICD-10-CM

## 2022-07-22 DIAGNOSIS — I10 Essential (primary) hypertension: Secondary | ICD-10-CM

## 2022-07-22 DIAGNOSIS — R5383 Other fatigue: Secondary | ICD-10-CM

## 2022-07-22 MED ORDER — ARIPIPRAZOLE 2 MG PO TABS: 2.0000 mg | ORAL_TABLET | Freq: Every day | ORAL | 2 refills | Status: DC

## 2022-07-25 NOTE — Assessment & Plan Note (Signed)
Labs unremarkable for cause. Likely related to depression but will continue to monitor

## 2022-07-25 NOTE — Assessment & Plan Note (Signed)
She acknowledges she is greatly improved but she notes she will suddenly feel very depressed again without warning. Endorses anhedonia but not suicidality. She is interested in staying on Fluoxetine but will add Abilify 2 mg qhs. Reassess in 8-12 weeks or as needed. Consider psychiatry evaluation.

## 2022-07-25 NOTE — Progress Notes (Addendum)
MyChart Video Visit    Virtual Visit via Video Note   This format is felt to be most appropriate for this patient at this time. Physical exam was limited by quality of the video and audio technology used for the visit. [name], CMA was able to get the patient set up on a video visit.  Patient location: Home Patient and provider in visit Provider location: Office  I discussed the limitations of evaluation and management by telemedicine and the availability of in person appointments. The patient expressed understanding and agreed to proceed.  Visit Date: 07/22/2022  Today's healthcare provider: Penni Homans, MD     Subjective:    Patient ID: Megan Hunt, female    DOB: May 31, 1985, 37 y.o.   MRN: [id number]  Chief Complaint  Patient presents with   Follow-up    Follow up    HPI Patient is in today for follow up on chronic medical concerns. No recent febrile illness or hospitalizations. Denies CP/palp/SOB/HA/congestion/fevers/GI or GU c/o. Taking meds as prescribed. She notes her mood is improved with Fluoxetine but she still has episodes of anhedonia that come on suddenly.   Past Medical History:  Diagnosis Date   Allergic rhinitis    Anemia    Anxiety and depression    Asthma, mild intermittent 10/27/2008   Qualifier: Diagnosis of  By: Lenna Gilford MD, Deborra Medina    Atypical chest pain    Back pain    Constipation    History of migraines    Hypercholesteremia    Hypertension    no longer taking meds   Insomnia 05/15/2016   Somatic dysfunction     Past Surgical History:  Procedure Laterality Date   TUBAL LIGATION Bilateral 11/28/2019   Procedure: POST PARTUM TUBAL LIGATION;  Surgeon: Caren Macadam, MD;  Location: [facility];  Service: Gynecology;  Laterality: Bilateral;   WISDOM TOOTH EXTRACTION      Family History  Problem Relation Age of Onset   Hypertension Father    Stroke Father        poor speech, weakness   Hypertension Mother    Arthritis Brother     Drug abuse Daughter    Stroke Maternal Grandfather    Hypertension Maternal Grandfather    Cancer Sister    Birth defects Paternal Aunt     Social History   Socioeconomic History   Marital status: Married    Spouse name: Not on file   Number of children: 2   Years of education: Not on file   Highest education level: Not on file  Occupational History   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    Comment: grad [employer] degree in social work  Tobacco Use   Smoking status: Never   Smokeless tobacco: Never  Vaping Use   Vaping Use: Never used  Substance and Sexual Activity   Alcohol use: No   Drug use: No   Sexual activity: Not Currently    Birth control/protection: Surgical    Comment: lives with husband and 3 small children, no dietary restrictions, stays at home  Other Topics Concern   Not on file  Social History Narrative   Son [place] born 02/2008 and daughter Eulogio Ditch born 06/2009   Vonna Kotyk born 03/22/2013   Social Determinants of Health   Financial Resource Strain: Not on file  Food Insecurity: Not on file  Transportation Needs: Not on file  Physical Activity: Not on file  Stress: Not  on file  Social Connections: Not on file  Intimate Partner Violence: Not on file    Outpatient Medications Prior to Visit  Medication Sig Dispense Refill   ALPRAZolam (XANAX) 0.25 MG tablet Take 1-2 tablets (0.25-0.5 mg total) by mouth 2 (two) times daily as needed for anxiety. 60 tablet 1   amLODipine (NORVASC) 10 MG tablet TAKE 1 TABLET BY MOUTH EVERY DAY 90 tablet 3   cetirizine (ZYRTEC) 10 MG tablet Take 1 tablet (10 mg total) by mouth daily. 90 tablet 1   diphenhydramine-acetaminophen (TYLENOL PM) 25-500 MG TABS tablet Take 1 tablet by mouth at bedtime as needed.     famotidine (PEPCID) 20 MG tablet Take 1 tablet (20 mg total) by mouth daily. 180 tablet 1   FLUoxetine (PROZAC) 20 MG capsule Take 2 capsules (40 mg total) by mouth daily. 180 capsule 1    fluticasone (FLONASE) 50 MCG/ACT nasal spray Place 2 sprays into both nostrils daily. 16 mL 0   Vitamin D, Ergocalciferol, (DRISDOL) 1.25 MG (50000 UNIT) CAPS capsule Take 1 capsule (50,000 Units total) by mouth every 7 (seven) days. 4 capsule 4   No facility-administered medications prior to visit.    No Known Allergies  Review of Systems  Constitutional:  Positive for malaise/fatigue. Negative for fever.  HENT:  Negative for congestion.   Eyes:  Negative for blurred vision.  Respiratory:  Negative for shortness of breath.   Cardiovascular:  Negative for chest pain, palpitations and leg swelling.  Gastrointestinal:  Negative for abdominal pain, blood in stool and nausea.  Genitourinary:  Negative for dysuria and frequency.  Musculoskeletal:  Negative for falls.  Skin:  Negative for rash.  Neurological:  Negative for dizziness, loss of consciousness and headaches.  Endo/Heme/Allergies:  Negative for environmental allergies.  Psychiatric/Behavioral:  Positive for depression. The patient is nervous/anxious.        Objective:    Physical Exam Constitutional:      General: She is not in acute distress.    Appearance: Normal appearance. She is not ill-appearing or toxic-appearing.  HENT:     Head: Normocephalic and atraumatic.     Right Ear: External ear normal.     Left Ear: External ear normal.     Nose: Nose normal.  Eyes:     General:        Right eye: No discharge.        Left eye: No discharge.  Pulmonary:     Effort: Pulmonary effort is normal.  Skin:    Findings: No rash.  Neurological:     Mental Status: She is alert and oriented to person, place, and time.  Psychiatric:        Behavior: Behavior normal.     There were no vitals taken for this visit. Wt Readings from Last 3 Encounters:  04/12/22 195 lb (88.5 kg)  01/12/22 201 lb (91.2 kg)  02/24/21 192 lb 12.8 oz (87.5 kg)       Assessment & Plan:  Hypertension, unspecified type Assessment &  Plan: Monitor and report any concerns, no changes to meds. Encouraged heart healthy diet such as the DASH diet and exercise as tolerated.  Monitor at home and report any concerns.    HYPERCHOLESTEROLEMIA Assessment & Plan: Encourage heart healthy diet such as MIND or DASH diet, increase exercise, avoid trans fats, simple carbohydrates and processed foods, consider a krill or fish or flaxseed oil cap daily.     Hyperglycemia Assessment & Plan: hgba1c acceptable, minimize simple  carbs. Increase exercise as tolerated.    Vitamin D deficiency Assessment & Plan: Supplement and monitor    Mild intermittent asthma without complication Assessment & Plan: No recent exacerbation    ANXIETY DEPRESSION Assessment & Plan: She acknowledges she is greatly improved but she notes she will suddenly feel very depressed again without warning. Endorses anhedonia but not suicidality. She is interested in staying on Fluoxetine but will add Abilify 2 mg qhs. Reassess in 8-12 weeks or as needed. Consider psychiatry evaluation.    Other fatigue Assessment & Plan: Labs unremarkable for cause. Likely related to depression but will continue to monitor   Other orders -     ARIPiprazole; Take 1 tablet (2 mg total) by mouth daily.  Dispense: 30 tablet; Refill: 2     I discussed the assessment and treatment plan with the patient. The patient was provided an opportunity to ask questions and all were answered. The patient agreed with the plan and demonstrated an understanding of the instructions.   The patient was advised to call back or seek an in-person evaluation if the symptoms worsen or if the condition fails to improve as anticipated.  Penni Homans, MD [facility] [place] (phone) [phone] (fax)  [place]

## 2022-07-26 ENCOUNTER — Encounter: Payer: Self-pay | Admitting: Family Medicine

## 2022-07-28 ENCOUNTER — Other Ambulatory Visit: Payer: 59

## 2022-08-13 ENCOUNTER — Encounter: Payer: Self-pay | Admitting: Family Medicine

## 2022-08-13 ENCOUNTER — Other Ambulatory Visit: Payer: Self-pay | Admitting: Family Medicine

## 2022-10-14 ENCOUNTER — Ambulatory Visit: Payer: 59 | Admitting: Family Medicine

## 2022-10-31 NOTE — Assessment & Plan Note (Addendum)
Struggles with anhedonia. Using Alprazolam prn, ability at bedtime and Sertraline

## 2022-10-31 NOTE — Assessment & Plan Note (Signed)
Encouraged good sleep hygiene such as dark, quiet room. No blue/[place] glowing lights such as computer screens in bedroom. No alcohol or stimulants in evening. Cut down on caffeine as able. Regular exercise is helpful but not just prior to bed time.  

## 2022-10-31 NOTE — Assessment & Plan Note (Signed)
Encourage heart healthy diet such as MIND or DASH diet, increase exercise, avoid trans fats, simple carbohydrates and processed foods, consider a krill or fish or flaxseed oil cap daily.  °

## 2022-10-31 NOTE — Assessment & Plan Note (Signed)
hgba1c acceptable, minimize simple carbs. Increase exercise as tolerated.  

## 2022-10-31 NOTE — Assessment & Plan Note (Signed)
Supplement and monitor 

## 2022-10-31 NOTE — Progress Notes (Unsigned)
MyChart Video Visit    Virtual Visit via Video Note   This patient is at least at moderate risk for complications without adequate follow up. This format is felt to be most appropriate for this patient at this time. Physical exam was limited by quality of the video and audio technology used for the visit. [name], CMA was able to get the patient set up on a video visit.  Patient location: home Patient and provider in visit Provider location: Office  I discussed the limitations of evaluation and management by telemedicine and the availability of in person appointments. The patient expressed understanding and agreed to proceed.  Visit Date: 11/01/2022  Today's healthcare provider: Danise Edge, MD     Subjective:    Patient ID: Megan Hunt, female    DOB: 05-19-1985, 37 y.o.   MRN: [id number]  No chief complaint on file.   HPI Discussed the use of AI scribe software for clinical note transcription with the patient, who gave verbal consent to proceed.  History of Present Illness   Patient is a 37 year old female in today via VV for follow up on chronic medical concerns. No recent febrile illness or hospitalizations. Denies CP/palp/SOB/HA/congestion/fevers/GI or GU c/o. Taking meds as prescribed           Past Medical History:  Diagnosis Date   Allergic rhinitis    Anemia    Anxiety and depression    Asthma, mild intermittent 10/27/2008   Qualifier: Diagnosis of  By: Kriste Basque MD, Lonzo Cloud    Atypical chest pain    Back pain    Constipation    History of migraines    Hypercholesteremia    Hypertension    no longer taking meds   Insomnia 05/15/2016   Somatic dysfunction     Past Surgical History:  Procedure Laterality Date   TUBAL LIGATION Bilateral 11/28/2019   Procedure: POST PARTUM TUBAL LIGATION;  Surgeon: Federico Flake, MD;  Location: [facility] LD ORS;  Service: Gynecology;  Laterality: Bilateral;   WISDOM TOOTH EXTRACTION      Family History  Problem  Relation Age of Onset   Hypertension Father    Stroke Father        poor speech, weakness   Hypertension Mother    Arthritis Brother    Drug abuse Daughter    Stroke Maternal Grandfather    Hypertension Maternal Grandfather    Cancer Sister    Birth defects Paternal Aunt     Social History   Socioeconomic History   Marital status: Married    Spouse name: Not on file   Number of children: 2   Years of education: Not on file   Highest education level: Not on file  Occupational History   Occupation: [employer]    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    Comment: grad [employer] degree in social work  Tobacco Use   Smoking status: Never   Smokeless tobacco: Never  Vaping Use   Vaping Use: Never used  Substance and Sexual Activity   Alcohol use: No   Drug use: No   Sexual activity: Not Currently    Birth control/protection: Surgical    Comment: lives with husband and 3 small children, no dietary restrictions, stays at home  Other Topics Concern   Not on file  Social History Narrative   Son [name] born 02/2008 and daughter [name] born 06/2009   Ivin Booty born 03/22/2013   Social Determinants of Health  Financial Resource Strain: Not on file  Food Insecurity: Not on file  Transportation Needs: Not on file  Physical Activity: Not on file  Stress: Not on file  Social Connections: Not on file  Intimate Partner Violence: Not on file    Outpatient Medications Prior to Visit  Medication Sig Dispense Refill   ALPRAZolam (XANAX) 0.25 MG tablet Take 1-2 tablets (0.25-0.5 mg total) by mouth 2 (two) times daily as needed for anxiety. 60 tablet 1   amLODipine (NORVASC) 10 MG tablet TAKE 1 TABLET BY MOUTH EVERY DAY 90 tablet 3   ARIPiprazole (ABILIFY) 2 MG tablet TAKE 1 TABLET BY MOUTH EVERY DAY 90 tablet 1   cetirizine (ZYRTEC) 10 MG tablet Take 1 tablet (10 mg total) by mouth daily. 90 tablet 1   diphenhydramine-acetaminophen (TYLENOL PM) 25-500 MG TABS tablet Take 1 tablet by  mouth at bedtime as needed.     famotidine (PEPCID) 20 MG tablet Take 1 tablet (20 mg total) by mouth daily. 180 tablet 1   FLUoxetine (PROZAC) 20 MG capsule Take 2 capsules (40 mg total) by mouth daily. 180 capsule 1   fluticasone (FLONASE) 50 MCG/ACT nasal spray Place 2 sprays into both nostrils daily. 16 mL 0   Vitamin D, Ergocalciferol, (DRISDOL) 1.25 MG (50000 UNIT) CAPS capsule Take 1 capsule (50,000 Units total) by mouth every 7 (seven) days. 4 capsule 4   No facility-administered medications prior to visit.    No Known Allergies  Review of Systems  Constitutional:  Positive for malaise/fatigue. Negative for fever.  HENT:  Negative for congestion.   Eyes:  Negative for blurred vision.  Respiratory:  Negative for shortness of breath.   Cardiovascular:  Negative for chest pain, palpitations and leg swelling.  Gastrointestinal:  Negative for abdominal pain, blood in stool and nausea.  Genitourinary:  Negative for dysuria and frequency.  Musculoskeletal:  Negative for falls.  Skin:  Negative for rash.  Neurological:  Negative for dizziness, loss of consciousness and headaches.  Endo/Heme/Allergies:  Negative for environmental allergies.  Psychiatric/Behavioral:  Positive for depression. The patient is nervous/anxious and has insomnia.       Objective:    Physical Exam Constitutional:      General: She is not in acute distress.    Appearance: Normal appearance. She is not ill-appearing or toxic-appearing.  HENT:     Head: Normocephalic and atraumatic.     Right Ear: External ear normal.     Left Ear: External ear normal.     Nose: Nose normal.  Eyes:     General:        Right eye: No discharge.        Left eye: No discharge.  Pulmonary:     Effort: Pulmonary effort is normal.  Skin:    Findings: No rash.  Neurological:     Mental Status: She is alert and oriented to person, place, and time.  Psychiatric:        Behavior: Behavior normal.   There were no vitals  taken for this visit. Wt Readings from Last 3 Encounters:  04/12/22 195 lb (88.5 kg)  01/12/22 201 lb (91.2 kg)  02/24/21 192 lb 12.8 oz (87.5 kg)       Assessment & Plan:  ANXIETY DEPRESSION Assessment & Plan: Struggles with anhedonia. Using Alprazolam prn, ability at bedtime and Sertraline   HYPERCHOLESTEROLEMIA Assessment & Plan: Encourage heart healthy diet such as MIND or DASH diet, increase exercise, avoid trans fats, simple carbohydrates and processed  foods, consider a krill or fish or flaxseed oil cap daily.     Vitamin D deficiency Assessment & Plan: Supplement and monitor    Hyperglycemia Assessment & Plan: hgba1c acceptable, minimize simple carbs. Increase exercise as tolerated.    Insomnia, unspecified type Assessment & Plan: Encouraged good sleep hygiene such as dark, quiet room. No blue/Starks glowing lights such as computer screens in bedroom. No alcohol or stimulants in evening. Cut down on caffeine as able. Regular exercise is helpful but not just prior to bed time.        Assessment and Plan              I discussed the assessment and treatment plan with the patient. The patient was provided an opportunity to ask questions and all were answered. The patient agreed with the plan and demonstrated an understanding of the instructions.   The patient was advised to call back or seek an in-person evaluation if the symptoms worsen or if the condition fails to improve as anticipated.  Danise Edge, MD [facility] at [facility] [phone] (phone) [phone] (fax)  [facility]

## 2022-11-01 ENCOUNTER — Telehealth (INDEPENDENT_AMBULATORY_CARE_PROVIDER_SITE_OTHER): Payer: 59 | Admitting: Family Medicine

## 2022-11-01 DIAGNOSIS — E7800 Pure hypercholesterolemia, unspecified: Secondary | ICD-10-CM | POA: Diagnosis not present

## 2022-11-01 DIAGNOSIS — E559 Vitamin D deficiency, unspecified: Secondary | ICD-10-CM

## 2022-11-01 DIAGNOSIS — R739 Hyperglycemia, unspecified: Secondary | ICD-10-CM

## 2022-11-01 DIAGNOSIS — F341 Dysthymic disorder: Secondary | ICD-10-CM | POA: Diagnosis not present

## 2022-11-01 DIAGNOSIS — G4700 Insomnia, unspecified: Secondary | ICD-10-CM

## 2022-11-01 MED ORDER — ARIPIPRAZOLE 5 MG PO TABS: 5.0000 mg | ORAL_TABLET | Freq: Every day | ORAL | 2 refills | Status: DC

## 2022-11-01 MED ORDER — ALPRAZOLAM 0.25 MG PO TABS: 0.2500 mg | ORAL_TABLET | Freq: Two times a day (BID) | ORAL | 3 refills | Status: DC | PRN

## 2022-11-19 ENCOUNTER — Encounter: Payer: Self-pay | Admitting: Family Medicine

## 2022-11-19 DIAGNOSIS — Z1231 Encounter for screening mammogram for malignant neoplasm of breast: Secondary | ICD-10-CM

## 2022-11-25 ENCOUNTER — Other Ambulatory Visit: Payer: Self-pay | Admitting: Family Medicine

## 2023-01-14 ENCOUNTER — Other Ambulatory Visit: Payer: Self-pay | Admitting: Family Medicine

## 2023-01-22 ENCOUNTER — Other Ambulatory Visit: Payer: Self-pay | Admitting: Family Medicine

## 2023-01-26 ENCOUNTER — Encounter: Payer: Self-pay | Admitting: Family Medicine

## 2023-02-01 ENCOUNTER — Telehealth: Payer: Self-pay

## 2023-02-02 NOTE — Assessment & Plan Note (Signed)
Monitor and report any concerns, no changes to meds. Encouraged heart healthy diet such as the DASH diet and exercise as tolerated.  Monitor at home and report any concerns.

## 2023-02-02 NOTE — Assessment & Plan Note (Signed)
hgba1c acceptable, minimize simple carbs. Increase exercise as tolerated.  

## 2023-02-02 NOTE — Assessment & Plan Note (Signed)
Supplement and monitor 

## 2023-02-02 NOTE — Assessment & Plan Note (Signed)
Encourage heart healthy diet such as MIND or DASH diet, increase exercise, avoid trans fats, simple carbohydrates and processed foods, consider a krill or fish or flaxseed oil cap daily.  °

## 2023-02-02 NOTE — Telephone Encounter (Signed)
Appt scheduled

## 2023-02-03 ENCOUNTER — Telehealth (INDEPENDENT_AMBULATORY_CARE_PROVIDER_SITE_OTHER): Payer: 59 | Admitting: Family Medicine

## 2023-02-03 DIAGNOSIS — E7800 Pure hypercholesterolemia, unspecified: Secondary | ICD-10-CM | POA: Diagnosis not present

## 2023-02-03 DIAGNOSIS — I10 Essential (primary) hypertension: Secondary | ICD-10-CM

## 2023-02-03 DIAGNOSIS — E559 Vitamin D deficiency, unspecified: Secondary | ICD-10-CM | POA: Diagnosis not present

## 2023-02-03 DIAGNOSIS — R739 Hyperglycemia, unspecified: Secondary | ICD-10-CM | POA: Diagnosis not present

## 2023-02-03 MED ORDER — ARIPIPRAZOLE 10 MG PO TABS
10.0000 mg | ORAL_TABLET | Freq: Every day | ORAL | 2 refills | Status: DC
Start: 2023-02-03 — End: 2023-02-28

## 2023-02-06 ENCOUNTER — Encounter: Payer: Self-pay | Admitting: Family Medicine

## 2023-02-06 NOTE — Progress Notes (Signed)
MyChart Video Visit    Virtual Visit via Video Note   This patient is at least at moderate risk for complications without adequate follow up. This format is felt to be most appropriate for this patient at this time. Physical exam was limited by quality of the video and audio technology used for the visit. [name], CMA was able to get the patient set up on a video visit.  Patient location: home Patient and provider in visit Provider location: Office  I discussed the limitations of evaluation and management by telemedicine and the availability of in person appointments. The patient expressed understanding and agreed to proceed.  Visit Date: 02/03/2023  Today's healthcare provider: Danise Edge, MD     Subjective:    Patient ID: Megan Hunt, female    DOB: [birth year], 37 y.o.   MRN: [id number]  Chief Complaint  Patient presents with   Medication Refill    Discuss Medication     HPI Discussed the use of AI scribe software for clinical note transcription with the patient, who gave verbal consent to proceed.  History of Present Illness   The patient, with a history of depression and anxiety, presents with worsening symptoms over the past couple of weeks. They describe a lack of energy, difficulty sleeping, and an inability to find joy in activities. They report feeling overwhelmed and have had thoughts of hospitalization, though deny suicidal ideation. They attribute the exacerbation of their symptoms to multiple stressors, including the start of the school year and family issues, such as their brother's health, their grandfather's hospitalization, and their mother's struggles.  The patient is currently on Abilify 5mg  and fluoxetine, which initially helped their symptoms. However, they report that the Abilify makes them sleepy, even when taken at bedtime. Despite this, they wake up within a few hours and struggle to fall back asleep.        Past Medical History:  Diagnosis  Date   Allergic rhinitis    Anemia    Anxiety and depression    Asthma, mild intermittent 10/27/2008   Qualifier: Diagnosis of  By: Kriste Basque MD, Lonzo Cloud    Atypical chest pain    Back pain    Constipation    History of migraines    Hypercholesteremia    Hypertension    no longer taking meds   Insomnia 05/15/2016   Somatic dysfunction     Past Surgical History:  Procedure Laterality Date   TUBAL LIGATION Bilateral 11/28/2019   Procedure: POST PARTUM TUBAL LIGATION;  Surgeon: Federico Flake, MD;  Location: [facility] LD ORS;  Service: Gynecology;  Laterality: Bilateral;   WISDOM TOOTH EXTRACTION      Family History  Problem Relation Age of Onset   Hypertension Father    Stroke Father        poor speech, weakness   Hypertension Mother    Arthritis Brother    Drug abuse Daughter    Stroke Maternal Grandfather    Hypertension Maternal Grandfather    Cancer Sister    Birth defects Paternal Aunt     Social History   Socioeconomic History   Marital status: Married    Spouse name: Not on file   Number of children: 2   Years of education: Not on file   Highest education level: Not on file  Occupational History   Occupation: [employer]    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    Comment: grad [employer] degree in social work  Tobacco  Use   Smoking status: Never   Smokeless tobacco: Never  Vaping Use   Vaping status: Never Used  Substance and Sexual Activity   Alcohol use: No   Drug use: No   Sexual activity: Not Currently    Birth control/protection: Surgical    Comment: lives with husband and 3 small children, no dietary restrictions, stays at home  Other Topics Concern   Not on file  Social History Narrative   Son [name] born 02/2008 and daughter [name] born 06/2009   Ivin Booty born 03/22/2013   Social Determinants of Health   Financial Resource Strain: Not on file  Food Insecurity: Not on file  Transportation Needs: Not on file  Physical Activity: Not on file   Stress: Not on file  Social Connections: Not on file  Intimate Partner Violence: Not on file    Outpatient Medications Prior to Visit  Medication Sig Dispense Refill   ALPRAZolam (XANAX) 0.25 MG tablet Take 1-2 tablets (0.25-0.5 mg total) by mouth 2 (two) times daily as needed for anxiety. 60 tablet 3   amLODipine (NORVASC) 10 MG tablet TAKE 1 TABLET BY MOUTH EVERY DAY 30 tablet 11   cetirizine (ZYRTEC) 10 MG tablet Take 1 tablet (10 mg total) by mouth daily. 90 tablet 1   diphenhydramine-acetaminophen (TYLENOL PM) 25-500 MG TABS tablet Take 1 tablet by mouth at bedtime as needed.     famotidine (PEPCID) 20 MG tablet Take 1 tablet (20 mg total) by mouth daily. 180 tablet 1   FLUoxetine (PROZAC) 20 MG capsule TAKE 2 CAPSULES BY MOUTH EVERY DAY 180 capsule 1   fluticasone (FLONASE) 50 MCG/ACT nasal spray Place 2 sprays into both nostrils daily. 16 mL 0   Vitamin D, Ergocalciferol, (DRISDOL) 1.25 MG (50000 UNIT) CAPS capsule Take 1 capsule (50,000 Units total) by mouth every 7 (seven) days. 4 capsule 4   ARIPiprazole (ABILIFY) 5 MG tablet Take 1 tablet (5 mg total) by mouth daily. 90 tablet 0   No facility-administered medications prior to visit.    No Known Allergies  Review of Systems  Constitutional:  Positive for malaise/fatigue. Negative for fever.  HENT:  Negative for congestion.   Eyes:  Negative for blurred vision.  Respiratory:  Negative for shortness of breath.   Cardiovascular:  Negative for chest pain, palpitations and leg swelling.  Gastrointestinal:  Negative for abdominal pain, blood in stool and nausea.  Genitourinary:  Negative for dysuria and frequency.  Musculoskeletal:  Negative for falls.  Skin:  Negative for rash.  Neurological:  Negative for dizziness, loss of consciousness and headaches.  Endo/Heme/Allergies:  Negative for environmental allergies.  Psychiatric/Behavioral:  Positive for depression. Negative for hallucinations, substance abuse and suicidal  ideas. The patient is nervous/anxious.        Objective:    Physical Exam Constitutional:      General: She is not in acute distress.    Appearance: Normal appearance. She is not ill-appearing or toxic-appearing.  HENT:     Head: Normocephalic and atraumatic.     Right Ear: External ear normal.     Left Ear: External ear normal.     Nose: Nose normal.  Eyes:     General:        Right eye: No discharge.        Left eye: No discharge.  Pulmonary:     Effort: Pulmonary effort is normal.  Skin:    Findings: No rash.  Neurological:     Mental Status: She is  alert and oriented to person, place, and time.  Psychiatric:        Behavior: Behavior normal.     There were no vitals taken for this visit. Wt Readings from Last 3 Encounters:  04/12/22 195 lb (88.5 kg)  01/12/22 201 lb (91.2 kg)  02/24/21 192 lb 12.8 oz (87.5 kg)       Assessment & Plan:  Hypertension, unspecified type Assessment & Plan: Monitor and report any concerns, no changes to meds. Encouraged heart healthy diet such as the DASH diet and exercise as tolerated.  Monitor at home and report any concerns.    HYPERCHOLESTEROLEMIA Assessment & Plan: Encourage heart healthy diet such as MIND or DASH diet, increase exercise, avoid trans fats, simple carbohydrates and processed foods, consider a krill or fish or flaxseed oil cap daily.     Vitamin D deficiency Assessment & Plan: Supplement and monitor    Hyperglycemia Assessment & Plan: hgba1c acceptable, minimize simple carbs. Increase exercise as tolerated.    Other orders -     ARIPiprazole; Take 1 tablet (10 mg total) by mouth daily.  Dispense: 30 tablet; Refill: 2     Assessment and Plan    Major Depressive Disorder Acute exacerbation over the past couple of weeks with significant functional impairment, anhedonia, and feelings of hopelessness. No suicidal ideation reported. Currently on Fluoxetine and Abilify 5mg  with some initial improvement  noted with Abilify initiation. -Increase Abilify from 5mg  to 10mg  daily. -Consider counseling if no improvement noted with medication adjustment. -Check in 6-8 weeks to assess response to medication adjustment. -Encouraged patient to seek immediate help at [facility] urgent behavioral health emergency room if feelings of desperation or suicidal thoughts occur.         I discussed the assessment and treatment plan with the patient. The patient was provided an opportunity to ask questions and all were answered. The patient agreed with the plan and demonstrated an understanding of the instructions.   The patient was advised to call back or seek an in-person evaluation if the symptoms worsen or if the condition fails to improve as anticipated.  Danise Edge, MD [facility] Primary Care at [facility] [phone] (phone) [phone] (fax)  [facility]

## 2023-02-20 ENCOUNTER — Other Ambulatory Visit: Payer: Self-pay | Admitting: Family Medicine

## 2023-02-28 ENCOUNTER — Other Ambulatory Visit: Payer: Self-pay | Admitting: Family Medicine

## 2023-08-04 ENCOUNTER — Other Ambulatory Visit: Payer: Self-pay | Admitting: Family Medicine

## 2023-08-05 NOTE — Telephone Encounter (Signed)
 Requesting: alprazolam 0.25mg   Contract: None UDS:  None Last Visit: 02/03/23 Next Visit: 10/20/23 Last Refill: 11/01/22 #60 and 3RF   Please Advise

## 2023-08-08 ENCOUNTER — Encounter: Payer: Self-pay | Admitting: Family Medicine

## 2023-08-09 ENCOUNTER — Encounter: Payer: Self-pay | Admitting: Family Medicine

## 2023-08-09 ENCOUNTER — Telehealth (INDEPENDENT_AMBULATORY_CARE_PROVIDER_SITE_OTHER): Admitting: Family Medicine

## 2023-08-09 DIAGNOSIS — R0789 Other chest pain: Secondary | ICD-10-CM | POA: Diagnosis not present

## 2023-08-09 DIAGNOSIS — E559 Vitamin D deficiency, unspecified: Secondary | ICD-10-CM

## 2023-08-09 DIAGNOSIS — G4700 Insomnia, unspecified: Secondary | ICD-10-CM | POA: Diagnosis not present

## 2023-08-09 DIAGNOSIS — I10 Essential (primary) hypertension: Secondary | ICD-10-CM | POA: Diagnosis not present

## 2023-08-09 DIAGNOSIS — F341 Dysthymic disorder: Secondary | ICD-10-CM

## 2023-08-09 DIAGNOSIS — R739 Hyperglycemia, unspecified: Secondary | ICD-10-CM

## 2023-08-09 DIAGNOSIS — E7800 Pure hypercholesterolemia, unspecified: Secondary | ICD-10-CM

## 2023-08-09 MED ORDER — VENLAFAXINE HCL ER 75 MG PO CP24
75.0000 mg | ORAL_CAPSULE | Freq: Every day | ORAL | 0 refills | Status: DC
Start: 2023-08-09 — End: 2023-10-20

## 2023-08-09 MED ORDER — VENLAFAXINE HCL ER 37.5 MG PO CP24: 37.5000 mg | ORAL_CAPSULE | Freq: Every day | ORAL | 0 refills | Status: DC

## 2023-08-09 MED ORDER — VENLAFAXINE HCL ER 150 MG PO CP24
150.0000 mg | ORAL_CAPSULE | Freq: Every day | ORAL | 2 refills | Status: DC
Start: 2023-08-09 — End: 2023-10-20

## 2023-08-09 NOTE — Progress Notes (Signed)
 MyChart Video Visit    Virtual Visit via Video Note   This patient is at least at moderate risk for complications without adequate follow up. This format is felt to be most appropriate for this patient at this time. Physical exam was limited by quality of the video and audio technology used for the visit. Megan Hunt, CMA was able to get the patient set up on a video visit.  Patient location: home Patient and provider in visit Provider location: Office  I discussed the limitations of evaluation and management by telemedicine and the availability of in person appointments. The patient expressed understanding and agreed to proceed.  Visit Date: 08/09/2023  Today's healthcare provider: Danise Edge, Megan Hunt     Subjective:    Patient ID: Megan Hunt, female    DOB: 11-27-1985, 38 y.o.   MRN: [id number]  No chief complaint on file.   HPI Discussed the use of AI scribe software for clinical note transcription with the patient, who gave verbal consent to proceed.  History of Present Illness Megan Hunt is a 38 year old female who presents with worsening anxiety and sleep disturbances.  She experiences increased anxiety and frequent crying spells, attributing these to a recent stressful trip with her daughter. She feels overwhelmed and unable to manage her emotions, leading to episodes of crying, particularly in front of her children, which she dislikes. She describes her life as 'crazy' and feels like she is losing a battle to regain control. She also mentions feeling unhappy and overwhelmed by everything, including her weight gain, which she attributes to stress.  She reports significant sleep disturbances, stating that she has difficulty falling asleep and often wakes up in the middle of the night. This results in feelings of anger and fatigue the following day.  She has been taking fluoxetine twice daily but stopped taking Abilify due to nightmares, which improved after  discontinuation. She also takes blood pressure medication and has been using Xanax more frequently, about twice a day, especially since her recent trip to [place], which she found particularly stressful due to traffic and congestion.  She reports a recent episode of chest pain while giving her children a bath, accompanied by sweating, nausea, and shortness of breath. The symptoms resolved after she sat down for 15-20 minutes. She has not experienced similar episodes since. No changes in bowel habits such as constipation or diarrhea.    Past Medical History:  Diagnosis Date   Allergic rhinitis    Anemia    Anxiety and depression    Asthma, mild intermittent 10/27/2008   Qualifier: Diagnosis of  By: Megan Basque Megan Hunt, Megan Hunt    Atypical chest pain    Back pain    Constipation    History of migraines    Hypercholesteremia    Hypertension    no longer taking meds   Insomnia 05/15/2016   Somatic dysfunction     Past Surgical History:  Procedure Laterality Date   TUBAL LIGATION Bilateral 11/28/2019   Procedure: POST PARTUM TUBAL LIGATION;  Surgeon: Megan Flake, Megan Hunt;  Location: [facility];  Service: Gynecology;  Laterality: Bilateral;   WISDOM TOOTH EXTRACTION      Family History  Problem Relation Age of Onset   Hypertension Father    Stroke Father        poor speech, weakness   Hypertension Mother    Arthritis Brother    Drug abuse Daughter    Stroke Maternal Grandfather  Hypertension Maternal Grandfather    Cancer Sister    Birth defects Paternal Aunt     Social History   Socioeconomic History   Marital status: Married    Spouse name: Not on file   Number of children: 2   Years of education: Not on file   Highest education level: Not on file  Occupational History   Occupation: [employer]    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    Comment: grad [employer] degree in social work  Tobacco Use   Smoking status: Never   Smokeless tobacco: Never  Vaping Use   Vaping  status: Never Used  Substance and Sexual Activity   Alcohol use: No   Drug use: No   Sexual activity: Not Currently    Birth control/protection: Surgical    Comment: lives with husband and 3 small children, no dietary restrictions, stays at home  Other Topics Concern   Not on file  Social History Narrative   Son Megan Hunt born 02/2008 and daughter Megan Hunt born 06/2009   Megan Hunt born 03/22/2013   Social Drivers of Health   Financial Resource Strain: Not on file  Food Insecurity: Not on file  Transportation Needs: Not on file  Physical Activity: Not on file  Stress: Not on file  Social Connections: Not on file  Intimate Partner Violence: Not on file    Outpatient Medications Prior to Visit  Medication Sig Dispense Refill   ALPRAZolam (XANAX) 0.25 MG tablet TAKE 1-2 TABLETS (0.25-0.5 MG TOTAL) BY MOUTH 2 (TWO) TIMES DAILY AS NEEDED FOR ANXIETY. 60 tablet 1   amLODipine (NORVASC) 10 MG tablet TAKE 1 TABLET BY MOUTH EVERY DAY 30 tablet 11   cetirizine (ZYRTEC) 10 MG tablet Take 1 tablet (10 mg total) by mouth daily. 90 tablet 1   diphenhydramine-acetaminophen (TYLENOL PM) 25-500 MG TABS tablet Take 1 tablet by mouth at bedtime as needed.     famotidine (PEPCID) 20 MG tablet Take 1 tablet (20 mg total) by mouth daily. 180 tablet 1   fluticasone (FLONASE) 50 MCG/ACT nasal spray Place 2 sprays into both nostrils daily. 16 mL 0   Vitamin D, Ergocalciferol, (DRISDOL) 1.25 MG (50000 UNIT) CAPS capsule TAKE 1 CAPSULE (50,000 UNITS TOTAL) BY MOUTH EVERY 7 (SEVEN) DAYS 4 capsule 4   ARIPiprazole (ABILIFY) 10 MG tablet TAKE 1 TABLET BY MOUTH EVERY DAY 90 tablet 1   FLUoxetine (PROZAC) 20 MG capsule TAKE 2 CAPSULES BY MOUTH EVERY DAY 180 capsule 1   No facility-administered medications prior to visit.    Allergies  Allergen Reactions   Abilify [Aripiprazole] Other (See Comments)    nightmares    Review of Systems  Constitutional:  Positive for malaise/fatigue. Negative for fever.  HENT:   Negative for congestion.   Eyes:  Negative for blurred vision.  Respiratory:  Negative for shortness of breath.   Cardiovascular:  Negative for chest pain, palpitations and leg swelling.  Gastrointestinal:  Negative for abdominal pain, blood in stool and nausea.  Genitourinary:  Negative for dysuria and frequency.  Musculoskeletal:  Negative for falls.  Skin:  Negative for rash.  Neurological:  Negative for dizziness, loss of consciousness and headaches.  Endo/Heme/Allergies:  Negative for environmental allergies.  Psychiatric/Behavioral:  Positive for depression. The patient is nervous/anxious and has insomnia.        Objective:    Physical Exam Constitutional:      General: She is not in acute distress.    Appearance: Normal appearance. She is not ill-appearing  or toxic-appearing.  HENT:     Head: Normocephalic and atraumatic.     Right Ear: External ear normal.     Left Ear: External ear normal.     Nose: Nose normal.  Eyes:     General:        Right eye: No discharge.        Left eye: No discharge.  Pulmonary:     Effort: Pulmonary effort is normal.  Skin:    Findings: No rash.  Neurological:     Mental Status: She is alert and oriented to person, place, and time.  Psychiatric:        Behavior: Behavior normal.     There were no vitals taken for this visit. Wt Readings from Last 3 Encounters:  04/12/22 195 lb (88.5 kg)  01/12/22 201 lb (91.2 kg)  02/24/21 192 lb 12.8 oz (87.5 kg)       Assessment & Plan:  ANXIETY DEPRESSION -     Ambulatory referral to Psychiatry; Future -     Ambulatory referral to [facility]; Future  Insomnia, unspecified type -     Ambulatory referral to Psychiatry; Future -     Ambulatory referral to [facility]; Future  CHEST PAIN, ATYPICAL -     Sedimentation rate; Future -     Ambulatory referral to Cardiology  Hypertension, unspecified type -     CBC with Differential/Platelet; Future -     TSH; Future -      Ambulatory referral to Cardiology  HYPERCHOLESTEROLEMIA -     Lipid panel; Future -     Ambulatory referral to Cardiology  Hyperglycemia -     Comprehensive metabolic panel with GFR; Future -     Hemoglobin A1c; Future -     Ambulatory referral to Cardiology  Vitamin D deficiency -     VITAMIN D 25 Hydroxy (Vit-D Deficiency, Fractures); Future  Other orders -     Venlafaxine HCl ER; Take 1 capsule (37.5 mg total) by mouth daily with breakfast.  Dispense: 7 capsule; Refill: 0 -     Venlafaxine HCl ER; Take 1 capsule (75 mg total) by mouth daily with breakfast.  Dispense: 30 capsule; Refill: 0 -     Venlafaxine HCl ER; Take 1 capsule (150 mg total) by mouth daily with breakfast.  Dispense: 30 capsule; Refill: 2     Assessment and Plan Assessment & Plan Major Depressive Disorder   Increased crying, anxiety, and disrupted sleep, exacerbated by a recent stressful trip. Discontinued Abilify due to nightmares, which improved post-discontinuation. Anxiety has worsened, leading to increased Xanax use. A comprehensive approach including psychiatry, counseling, and medication adjustment is recommended. A switch from fluoxetine to venlafaxine is proposed to address both serotonin and norepinephrine pathways. Discussed potential side effects of venlafaxine, including sleep disruption and gastrointestinal issues, and strategies to manage them.   - Switch from fluoxetine to venlafaxine, starting with 37.5 mg for one week, then 75 mg for a month, and finally 150 mg as a standard dose.   - Continue fluoxetine during the transition, reducing to one capsule daily, and discontinue after transitioning to venlafaxine.   - Refer to psychiatry for medication management.   - Refer to counseling for behavioral modification and stress management.   - Provide phone number for [facility] for faster appointment scheduling.   - Advise on lifestyle modifications including adequate sleep, hydration,  healthy diet, and spending time outdoors.    Chest Pain  Intermittent chest pain, possibly musculoskeletal or stress-related, occurred during physical activity and resolved with rest. Associated symptoms included sweating, nausea, and shortness of breath. Considering cardiac etiology due to risk factors, further evaluation is suggested. Discussed the importance of monitoring symptoms and seeking immediate care if symptoms worsen.   - Refer to cardiology for evaluation of chest pain.   - Order updated lab work including cholesterol, glucose, vitamin D, sed rate, kidney function, and thyroid function.     I discussed the assessment and treatment plan with the patient. The patient was provided an opportunity to ask questions and all were answered. The patient agreed with the plan and demonstrated an understanding of the instructions.   The patient was advised to call back or seek an in-person evaluation if the symptoms worsen or if the condition fails to improve as anticipated.  Megan Edge, Megan Hunt [facility] at [facility] [phone] (phone) [phone] (fax)  [facility]

## 2023-08-12 ENCOUNTER — Encounter: Payer: Self-pay | Admitting: Family Medicine

## 2023-08-12 ENCOUNTER — Other Ambulatory Visit (INDEPENDENT_AMBULATORY_CARE_PROVIDER_SITE_OTHER)

## 2023-08-12 DIAGNOSIS — E559 Vitamin D deficiency, unspecified: Secondary | ICD-10-CM

## 2023-08-12 DIAGNOSIS — R739 Hyperglycemia, unspecified: Secondary | ICD-10-CM

## 2023-08-12 DIAGNOSIS — I10 Essential (primary) hypertension: Secondary | ICD-10-CM

## 2023-08-12 DIAGNOSIS — R0789 Other chest pain: Secondary | ICD-10-CM | POA: Diagnosis not present

## 2023-08-12 DIAGNOSIS — E7800 Pure hypercholesterolemia, unspecified: Secondary | ICD-10-CM | POA: Diagnosis not present

## 2023-08-12 LAB — LIPID PANEL
Cholesterol: 224 mg/dL — ABNORMAL HIGH (ref 0–200)
HDL: 60.6 mg/dL (ref >39.00)
LDL Cholesterol: 145 mg/dL — ABNORMAL HIGH (ref 0–99)
NonHDL: 163.63
Total CHOL/HDL Ratio: 4
Triglycerides: 95 mg/dL (ref 0.0–149.0)
VLDL: 19 mg/dL (ref 0.0–40.0)

## 2023-08-12 LAB — CBC WITH DIFFERENTIAL/PLATELET
Basophils Absolute: 0.1 10*3/uL (ref 0.0–0.1)
Basophils Relative: 0.7 % (ref 0.0–3.0)
Eosinophils Absolute: 0.1 10*3/uL (ref 0.0–0.7)
Eosinophils Relative: 1 % (ref 0.0–5.0)
HCT: 38.1 % (ref 36.0–46.0)
Hemoglobin: 12.4 g/dL (ref 12.0–15.0)
Lymphocytes Relative: 27.6 % (ref 12.0–46.0)
Lymphs Abs: 2.5 10*3/uL (ref 0.7–4.0)
MCHC: 32.6 g/dL (ref 30.0–36.0)
MCV: 84.5 fl (ref 78.0–100.0)
Monocytes Absolute: 0.8 10*3/uL (ref 0.1–1.0)
Monocytes Relative: 8.7 % (ref 3.0–12.0)
Neutro Abs: 5.7 10*3/uL (ref 1.4–7.7)
Neutrophils Relative %: 62 % (ref 43.0–77.0)
Platelets: 295 10*3/uL (ref 150.0–400.0)
RBC: 4.51 Mil/uL (ref 3.87–5.11)
RDW: 13.2 % (ref 11.5–15.5)
WBC: 9.1 10*3/uL (ref 4.0–10.5)

## 2023-08-12 LAB — COMPREHENSIVE METABOLIC PANEL WITH GFR
ALT: 15 U/L (ref 0–35)
AST: 15 U/L (ref 0–37)
Albumin: 4.5 g/dL (ref 3.5–5.2)
Alkaline Phosphatase: 133 U/L — ABNORMAL HIGH (ref 39–117)
BUN: 8 mg/dL (ref 6–23)
CO2: 26 meq/L (ref 19–32)
Calcium: 9 mg/dL (ref 8.4–10.5)
Chloride: 102 meq/L (ref 96–112)
Creatinine, Ser: 0.73 mg/dL (ref 0.40–1.20)
GFR: 104.37 mL/min (ref >60.00)
Glucose, Bld: 93 mg/dL (ref 70–99)
Potassium: 3.9 meq/L (ref 3.5–5.1)
Sodium: 137 meq/L (ref 135–145)
Total Bilirubin: 0.4 mg/dL (ref 0.2–1.2)
Total Protein: 7.1 g/dL (ref 6.0–8.3)

## 2023-08-12 LAB — TSH: TSH: 0.53 u[IU]/mL (ref 0.35–5.50)

## 2023-08-12 LAB — VITAMIN D 25 HYDROXY (VIT D DEFICIENCY, FRACTURES): VITD: 20.32 ng/mL — ABNORMAL LOW (ref 30.00–100.00)

## 2023-08-12 LAB — SEDIMENTATION RATE: Sed Rate: 32 mm/h — ABNORMAL HIGH (ref 0–20)

## 2023-08-12 LAB — HEMOGLOBIN A1C: Hgb A1c MFr Bld: 6 % (ref 4.6–6.5)

## 2023-08-16 ENCOUNTER — Other Ambulatory Visit: Payer: Self-pay | Admitting: Emergency Medicine

## 2023-08-16 MED ORDER — VITAMIN D (ERGOCALCIFEROL) 1.25 MG (50000 UNIT) PO CAPS: 50000.0000 [IU] | ORAL_CAPSULE | ORAL | 4 refills | Status: AC

## 2023-08-31 ENCOUNTER — Other Ambulatory Visit: Payer: Self-pay | Admitting: Family Medicine

## 2023-09-06 ENCOUNTER — Telehealth (INDEPENDENT_AMBULATORY_CARE_PROVIDER_SITE_OTHER): Admitting: Family Medicine

## 2023-09-06 ENCOUNTER — Other Ambulatory Visit: Payer: Self-pay

## 2023-09-06 ENCOUNTER — Encounter: Payer: Self-pay | Admitting: Family Medicine

## 2023-09-06 DIAGNOSIS — R5383 Other fatigue: Secondary | ICD-10-CM | POA: Diagnosis not present

## 2023-09-06 DIAGNOSIS — F341 Dysthymic disorder: Secondary | ICD-10-CM

## 2023-09-06 DIAGNOSIS — E559 Vitamin D deficiency, unspecified: Secondary | ICD-10-CM | POA: Diagnosis not present

## 2023-09-06 DIAGNOSIS — I10 Essential (primary) hypertension: Secondary | ICD-10-CM

## 2023-09-06 DIAGNOSIS — R739 Hyperglycemia, unspecified: Secondary | ICD-10-CM

## 2023-09-06 DIAGNOSIS — R700 Elevated erythrocyte sedimentation rate: Secondary | ICD-10-CM

## 2023-09-06 DIAGNOSIS — E7800 Pure hypercholesterolemia, unspecified: Secondary | ICD-10-CM

## 2023-09-06 MED ORDER — POLYETHYLENE GLYCOL 3350 17 GM/SCOOP PO POWD: 17.0000 g | Freq: Two times a day (BID) | ORAL | 1 refills | Status: DC | PRN

## 2023-09-06 MED ORDER — BENEFIBER PO POWD: 1.0000 | Freq: Two times a day (BID) | ORAL | 1 refills | Status: DC | PRN

## 2023-09-06 NOTE — Assessment & Plan Note (Signed)
 She notes worsening of constipation since Venlafaxine  started to once weekly so we will start Miralax and benefiber once to twice daily and Encouraged increased hydration and fiber in diet. Daily probiotics. Stay active with minimum 4000 steps daily

## 2023-09-06 NOTE — Assessment & Plan Note (Signed)
 Supplement and monitor

## 2023-09-06 NOTE — Assessment & Plan Note (Signed)
Monitor and report any concerns, no changes to meds. Encouraged heart healthy diet such as the DASH diet and exercise as tolerated.  Monitor at home and report any concerns.

## 2023-09-06 NOTE — Assessment & Plan Note (Signed)
 She is seeing counselor soon and is looking forward to starting. She is tolerating Venlafaxine  XR 75 mg will increase to 150 mg next month. She feels some better already. She will notify us  if that changes

## 2023-09-07 NOTE — Progress Notes (Signed)
 MyChart Video Visit    Virtual Visit via Video Note   This patient is at least at moderate risk for complications without adequate follow up. This format is felt to be most appropriate for this patient at this time. Physical exam was limited by quality of the video and audio technology used for the visit. Megan Hunt, CMA was able to get the patient set up on a video visit.  Patient location: home Patient and provider in visit Provider location: Office  I discussed the limitations of evaluation and management by telemedicine and the availability of in person appointments. The patient expressed understanding and agreed to proceed.  Visit Date: 09/06/2023  Today's healthcare provider: Randie Bustle, MD     Subjective:    Patient ID: Megan Hunt, female    DOB: 1986/01/30, 38 y.o.   MRN: [id number]  Chief Complaint  Patient presents with   Medical Management of Chronic Issues    Patient presents today for a month follow-up   Quality Metric Gaps    TDAP, pneumococcal    HPI Discussed the use of AI scribe software for clinical note transcription with the patient, who gave verbal consent to proceed.  History of Present Illness Megan Hunt is a 38 year old female who presents with constipation and medication side effects.  She experiences significant constipation, sometimes going a week or more without a bowel movement. The constipation is described as taking a long time to pass, with no blood in the stool. She has not used fiber supplements or probiotics previously.  She has been taking her medication in the mornings but switched to taking it at night due to drowsiness. She is currently on a 75 mg dose and plans to increase to 150 mg next month. Additionally, she takes vitamin D  weekly and a daily over-the-counter dose of 2000 IU.  In terms of lifestyle, she reports doing 'okay' with her diet, fluid intake, and physical activity, including steps.    Past Medical History:   Diagnosis Date   Allergic rhinitis    Anemia    Anxiety and depression    Asthma, mild intermittent 10/27/2008   Qualifier: Diagnosis of  By: Elinor Guardian MD, Raenelle Bumpers    Atypical chest pain    Back pain    Constipation    History of migraines    Hypercholesteremia    Hypertension    no longer taking meds   Insomnia 05/15/2016   Somatic dysfunction     Past Surgical History:  Procedure Laterality Date   TUBAL LIGATION Bilateral 11/28/2019   Procedure: POST PARTUM TUBAL LIGATION;  Surgeon: Abner Ables, MD;  Location: [facility] LD [facility]S;  Service: Gynecology;  Laterality: Bilateral;   WISDOM TOOTH EXTRACTION      Family History  Problem Relation Age of Onset   Hypertension Father    Stroke Father        poor speech, weakness   Hypertension Mother    Arthritis Brother    Drug abuse Daughter    Stroke Maternal Grandfather    Hypertension Maternal Grandfather    Cancer Sister    Birth defects Paternal Aunt     Social History   Socioeconomic History   Marital status: Married    Spouse name: Not on file   Number of children: 2   Years of education: Not on file   Highest education level: Not on file  Occupational History   Occupation: [employer]    Comment: after  school program    Comment: grad [employer] degree in social work  Tobacco Use   Smoking status: Never   Smokeless tobacco: Never  Vaping Use   Vaping status: Never Used  Substance and Sexual Activity   Alcohol use: No   Drug use: No   Sexual activity: Not Currently    Birth control/protection: Surgical    Comment: lives with husband and 3 small children, no dietary restrictions, stays at home  Other Topics Concern   Not on file  Social History Narrative   Son Megan Hunt born 02/2008 and daughter Megan Hunt born 06/2009   Megan Hunt born 03/22/2013   Social Drivers of Health   Financial Resource Strain: Not on file  Food Insecurity: Not on file  Transportation Needs: Not on file  Physical Activity: Not on file   Stress: Not on file  Social Connections: Not on file  Intimate Partner Violence: Not on file    Outpatient Medications Prior to Visit  Medication Sig Dispense Refill   ALPRAZolam  (XANAX ) 0.25 MG tablet TAKE 1-2 TABLETS (0.25-0.5 MG TOTAL) BY MOUTH 2 (TWO) TIMES DAILY AS NEEDED FOR ANXIETY. 60 tablet 1   amLODipine  (NORVASC ) 10 MG tablet TAKE 1 TABLET BY MOUTH EVERY DAY 30 tablet 11   cetirizine  (ZYRTEC ) 10 MG tablet Take 1 tablet (10 mg total) by mouth daily. 90 tablet 1   diphenhydramine -acetaminophen  (TYLENOL  PM) 25-500 MG TABS tablet Take 1 tablet by mouth at bedtime as needed.     famotidine  (PEPCID ) 20 MG tablet Take 1 tablet (20 mg total) by mouth daily. 180 tablet 1   fluticasone  (FLONASE ) 50 MCG/ACT nasal spray Place 2 sprays into both nostrils daily. 16 mL 0   venlafaxine  XR (EFFEXOR  XR) 75 MG 24 hr capsule Take 1 capsule (75 mg total) by mouth daily with breakfast. 30 capsule 0   venlafaxine  XR (EFFEXOR -XR) 150 MG 24 hr capsule Take 1 capsule (150 mg total) by mouth daily with breakfast. 30 capsule 2   Vitamin D , Ergocalciferol , (DRISDOL ) 1.25 MG (50000 UNIT) CAPS capsule Take 1 capsule (50,000 Units total) by mouth every 7 (seven) days. 4 capsule 4   venlafaxine  XR (EFFEXOR  XR) 37.5 MG 24 hr capsule Take 1 capsule (37.5 mg total) by mouth daily with breakfast. 7 capsule 0   No facility-administered medications prior to visit.    Allergies  Allergen Reactions   Abilify  [Aripiprazole ] Other (See Comments)    nightmares    Review of Systems  Constitutional:  Positive for malaise/fatigue. Negative for fever.  HENT:  Negative for congestion.   Eyes:  Negative for blurred vision.  Respiratory:  Negative for shortness of breath.   Cardiovascular:  Negative for chest pain, palpitations and leg swelling.  Gastrointestinal:  Positive for constipation. Negative for abdominal pain, blood in stool and nausea.  Genitourinary:  Negative for dysuria and frequency.  Musculoskeletal:   Negative for falls.  Skin:  Negative for rash.  Neurological:  Negative for dizziness, loss of consciousness and headaches.  Endo/Heme/Allergies:  Negative for environmental allergies.  Psychiatric/Behavioral:  Positive for depression. The patient is not nervous/anxious.        Objective:    Physical Exam Constitutional:      General: She is not in acute distress.    Appearance: Normal appearance. She is not ill-appearing or toxic-appearing.  HENT:     Head: Normocephalic and atraumatic.     Right Ear: External ear normal.     Left Ear: External ear normal.  Nose: Nose normal.  Eyes:     General:        Right eye: No discharge.        Left eye: No discharge.  Pulmonary:     Effort: Pulmonary effort is normal.  Skin:    Findings: No rash.  Neurological:     Mental Status: She is alert and oriented to person, place, and time.  Psychiatric:        Behavior: Behavior normal.     Breastfeeding No  Wt Readings from Last 3 Encounters:  04/12/22 195 lb (88.5 kg)  01/12/22 201 lb (91.2 kg)  02/24/21 192 lb 12.8 oz (87.5 kg)       Assessment & Plan:  ANXIETY DEPRESSION Assessment & Plan: She is seeing counselor soon and is looking forward to starting. She is tolerating Venlafaxine  XR 75 mg will increase to 150 mg next month. She feels some better already. She will notify us  if that changes   Other fatigue Assessment & Plan: She notes worsening of constipation since Venlafaxine  started to once weekly so we will start Miralax and benefiber once to twice daily and Encouraged increased hydration and fiber in diet. Daily probiotics. Stay active with minimum 4000 steps daily    Vitamin D  deficiency Assessment & Plan: Supplement and monitor    Hypertension, unspecified type Assessment & Plan: Monitor and report any concerns, no changes to meds. Encouraged heart healthy diet such as the DASH diet and exercise as tolerated.  Monitor at home and report any concerns.     Other orders -     Polyethylene Glycol 3350; Take 17 g by mouth 2 (two) times daily as needed for moderate constipation.  Dispense: 3350 g; Refill: 1 -     Benefiber; Take 1 Dose by mouth 2 (two) times daily as needed.  Dispense: 730 g; Refill: 1     Assessment and Plan Assessment & Plan Constipation Significant constipation with infrequent bowel movements. Recommended increased hydration, physical activity, and dietary fiber. Suggested fiber supplement and probiotic. Miralax advised for bowel movement facilitation. Explained benefits of fiber supplements and Miralax usage. - Increase water intake to 80 ounces daily and aim for at least 4,000 steps per day. - Adopt a high-fiber diet including fruits, vegetables, and whole grains. - Start a fiber supplement such as Benefiber or Metamucil. - Consider taking a probiotic daily. - Start Miralax once daily with Benefiber; increase to twice daily if not effective after one to two weeks. - Send prescription for Miralax to [facility] in [place] and check insurance coverage.  Anxiety and depression Medication causes drowsiness when taken in the morning; adjusted to nighttime dosing. Upcoming counseling with Dr. Jettie Morse. - Continue taking medication at night to avoid daytime drowsiness. - Attend counseling appointment with Dr. Jettie Morse.  Vitamin D  deficiency Currently on weekly and daily vitamin D  supplementation. Long-term goal to maintain normal levels. Blood work planned for July or August. - Continue taking vitamin D  50,000 IU once weekly and 2,000 IU over-the-counter daily. - Plan to recheck vitamin D  levels in July or August.     I discussed the assessment and treatment plan with the patient. The patient was provided an opportunity to ask questions and all were answered. The patient agreed with the plan and demonstrated an understanding of the instructions.   The patient was advised to call back or seek an in-person evaluation if  the symptoms worsen or if the condition fails to improve as anticipated.  Ammon Bales  Rodrick Clapper, MD [facility] at [facility] [phone] (phone) [phone] (fax)  [facility]

## 2023-09-08 ENCOUNTER — Ambulatory Visit: Admitting: Psychology

## 2023-09-08 DIAGNOSIS — F331 Major depressive disorder, recurrent, moderate: Secondary | ICD-10-CM | POA: Diagnosis not present

## 2023-09-08 NOTE — Progress Notes (Signed)
 [facility] Counselor Initial Adult Exam  Name: Megan Hunt Date: 09/08/2023 MRN: [id number] DOB: 12-15-1985 PCP: Neda Balk, MD  Time spent: 11:00am-11:50am   50 minutes  Guardian/Payee:  Jennifer Moellers requested: No   Reason for Visit /Presenting Problem: Pt present for face-to-face initial assessment via video.  Pt consents to telehealth video session and is aware of limitations and benefits of virtual sessions.  Location of pt: home Location of therapist: home office.  Pt has had issues with depression for a long time.   Pt also has some problems with sleep.   The problems with sleep just started 3 months ago.   Pt states she feels tired all the time and does not enjoy things she use to enjoy.  She has low energy and low motivation.   Pt has gained weight and feels badly about her weight. Pt has has trouble with her sleep the past 3 months and is not sure what started it.  She has trouble sleeping most nights.  Pt tries to go to bed at about 11:00pm.  It takes pt over an hour to get to sleep.  Pt feels like her mind is racing and won't turn off.   At times pt wakes up in the middle of the night and has trouble getting back to sleep.  It takes about 20-30 minutes to get back to sleep.   Pt usually gets up about 6:30am.   Sometimes pt goes back to sleep at about 9:15am.  Pt usually feels tired when she wakes up.   Pt states she has also had some trouble with anxiety.   She gets anxious about her sleep and about her kids.  She gets anxious when she travels alone with her daughter to volley ball games.   Pt tends to worry.    Mental Status Exam: Appearance:   Casual     Behavior:  Appropriate  Motor:  Normal  Speech/Language:   Normal Rate  Affect:  Appropriate  Mood:  normal  Thought process:  normal  Thought content:    WNL  Sensory/Perceptual disturbances:    WNL  Orientation:  oriented to person, place, time/date, and situation  Attention:  Good   Concentration:  Good  Memory:  WNL  Fund of knowledge:   Good  Insight:    Good  Judgment:   Good  Impulse Control:  Good    Reported Symptoms:  depression  Risk Assessment: Danger to Self:  No Self-injurious Behavior: No Danger to Others: No Duty to Warn:no Physical Aggression / Violence:No  Access to Firearms a concern: No  Gang Involvement:No  Patient / guardian was educated about steps to take if suicide or homicide risk level increases between visits: n/a While future psychiatric events cannot be accurately predicted, the patient does not currently require acute inpatient psychiatric care and does not currently meet [place]  involuntary commitment criteria.  Substance Abuse History: Current substance abuse: No     Past Psychiatric History:   Previous psychological history is significant for depression Outpatient Providers:this is pt's first time in therapy. History of Psych Hospitalization: No  Psychological Testing:  n/a    Abuse History:  Victim of: No.,  n/a    Report needed: No. Victim of Neglect:No. Perpetrator of  n/a   Witness / Exposure to Domestic Violence: No   Protective Services Involvement: No  Witness to [employer] Violence:  No   Family History:  Family History  Problem Relation  Age of Onset   Hypertension Father    Stroke Father        poor speech, weakness   Hypertension Mother    Arthritis Brother    Drug abuse Daughter    Stroke Maternal Grandfather    Hypertension Maternal Grandfather    Cancer Sister    Birth defects Paternal Aunt   Pt grew up with her parents and younger brother.    Family history of mental health issues: none known. No childhood abuse.   Pt states she had a good childhood.    Living situation: the patient lives with her spouse and five kids ages 28, 40, 81, 84, and 3.  Sexual Orientation: Straight  Relationship Status: married for 10 years and together for 20 years. Name of spouse / other:Mike If a  parent, number of children / ages:Pt has 5 kids ages 40,14,10,5, and 3  Support Systems: spouse parents  Financial Stress:  No   Income/Employment/Disability: Employment by husband. Pt is a stay at home mom.  Military Service: No   Educational History: Education: Engineer, maintenance (IT).   Pt has a BSW.  Religion/Sprituality/World View: Protestant  Any cultural differences that may affect / interfere with treatment:  not applicable   Recreation/Hobbies: pt likes to spend time with her kids and playing soft ball.  Stressors: Other: depression, difficulty sleeping. Worries about kids.    Strengths: Supportive Relationships, Spirituality, and Able to Communicate Effectively  Barriers:  none   Legal History: Pending legal issue / charges: The patient has no significant history of legal issues. History of legal issue / charges:  n/a  Medical History/Surgical History: reviewed Past Medical History:  Diagnosis Date   Allergic rhinitis    Anemia    Anxiety and depression    Asthma, mild intermittent 10/27/2008   Qualifier: Diagnosis of  By: Elinor Guardian MD, Raenelle Bumpers    Atypical chest pain    Back pain    Constipation    History of migraines    Hypercholesteremia    Hypertension    no longer taking meds   Insomnia 05/15/2016   Somatic dysfunction     Past Surgical History:  Procedure Laterality Date   TUBAL LIGATION Bilateral 11/28/2019   Procedure: POST PARTUM TUBAL LIGATION;  Surgeon: Abner Ables, MD;  Location: [facility] LD ORS;  Service: Gynecology;  Laterality: Bilateral;   WISDOM TOOTH EXTRACTION      Medications: Current Outpatient Medications  Medication Sig Dispense Refill   ALPRAZolam  (XANAX ) 0.25 MG tablet TAKE 1-2 TABLETS (0.25-0.5 MG TOTAL) BY MOUTH 2 (TWO) TIMES DAILY AS NEEDED FOR ANXIETY. 60 tablet 1   amLODipine  (NORVASC ) 10 MG tablet TAKE 1 TABLET BY MOUTH EVERY DAY 30 tablet 11   cetirizine  (ZYRTEC ) 10 MG tablet Take 1 tablet (10 mg total) by mouth daily. 90  tablet 1   diphenhydramine -acetaminophen  (TYLENOL  PM) 25-500 MG TABS tablet Take 1 tablet by mouth at bedtime as needed.     famotidine  (PEPCID ) 20 MG tablet Take 1 tablet (20 mg total) by mouth daily. 180 tablet 1   fluticasone  (FLONASE ) 50 MCG/ACT nasal spray Place 2 sprays into both nostrils daily. 16 mL 0   polyethylene glycol powder (GLYCOLAX /MIRALAX ) 17 GM/SCOOP powder Take 17 g by mouth 2 (two) times daily as needed for moderate constipation. 3350 g 1   venlafaxine  XR (EFFEXOR  XR) 75 MG 24 hr capsule Take 1 capsule (75 mg total) by mouth daily with breakfast. 30 capsule 0   venlafaxine  XR (EFFEXOR -XR)  150 MG 24 hr capsule Take 1 capsule (150 mg total) by mouth daily with breakfast. 30 capsule 2   Vitamin D , Ergocalciferol , (DRISDOL ) 1.25 MG (50000 UNIT) CAPS capsule Take 1 capsule (50,000 Units total) by mouth every 7 (seven) days. 4 capsule 4   Wheat Dextrin (BENEFIBER) POWD Take 1 Dose by mouth 2 (two) times daily as needed. 730 g 1   No current facility-administered medications for this visit.    Allergies  Allergen Reactions   Abilify  [Aripiprazole ] Other (See Comments)    nightmares    Diagnoses:  F33.1  Plan of Care: Recommend ongoing therapy.   Pt participated in setting treatment goals.   Pt wants to feel less depressed and enjoy things she use to enjoy.  She wants to sleep better. Plan to meet every two weeks.   Pt agrees with treatment plan.    Treatment Plan Client Abilities/Strengths  Pt is bright, engaging, and motivated for therapy.   Client Treatment Preferences  Individual therapy.  Client Statement of Needs  Improve coping skills.  Symptoms  Depressed or irritable mood. Weight gain / emotional eating Diminished interest in or enjoyment of activities. Sleeplessness or hypersomnia. Lack of energy. Low self-esteem.  Problems Addressed  Unipolar Depression Goals 1. Alleviate depressive symptoms and return to previous level of effective functioning. 2.  Appropriately grieve the loss in order to normalize mood and to return to previously adaptive level of functioning. Objective Learn and implement behavioral strategies to overcome depression. Target Date: 2024-09-07 Frequency: Biweekly  Progress: 10 Modality: individual  Related Interventions Engage the client in "behavioral activation," increasing his/her activity level and contact with sources of reward, while identifying processes that inhibit activation.  Use behavioral techniques such as instruction, rehearsal, role-playing, role reversal, as needed, to facilitate activity in the client's daily life; reinforce success. Assist the client in developing skills that increase the likelihood of deriving pleasure from behavioral activation (e.g., assertiveness skills, developing an exercise plan, less internal/more external focus, increased social involvement); reinforce success. Objective Identify important people in life, past and present, and describe the quality, good and poor, of those relationships. Target Date: 2024-09-07 Frequency: Biweekly  Progress: 10 Modality: individual  Related Interventions Conduct Interpersonal Therapy beginning with the assessment of the client's "interpersonal inventory" of important past and present relationships; develop a case formulation linking depression to grief, interpersonal role disputes, role transitions, and/or interpersonal deficits). Objective Learn and implement problem-solving and decision-making skills. Target Date: 2024-09-07 Frequency: Biweekly  Progress: 10 Modality: individual  Related Interventions Conduct Problem-Solving Therapy using techniques such as psychoeducation, modeling, and role-playing to teach client problem-solving skills (i.e., defining a problem specifically, generating possible solutions, evaluating the pros and cons of each solution, selecting and implementing a plan of action, evaluating the efficacy of the plan, accepting  or revising the plan); role-play application of the problem-solving skill to a real life issue. Encourage in the client the development of a positive problem orientation in which problems and solving them are viewed as a natural part of life and not something to be feared, despaired, or avoided. 3. Develop healthy interpersonal relationships that lead to the alleviation and help prevent the relapse of depression. 4. Develop healthy thinking patterns and beliefs about self, others, and the world that lead to the alleviation and help prevent the relapse of depression. 5. Recognize, accept, and cope with feelings of depression. Diagnosis F33.1  Conditions For Discharge Achievement of treatment goals and objectives    Willey Harrier, LCSW

## 2023-09-26 ENCOUNTER — Ambulatory Visit: Admitting: Psychology

## 2023-09-26 DIAGNOSIS — F331 Major depressive disorder, recurrent, moderate: Secondary | ICD-10-CM | POA: Diagnosis not present

## 2023-09-26 NOTE — Progress Notes (Signed)
 [facility] Counselor/Therapist Progress Note  Patient ID: Megan Hunt, MRN: [id number],    Date: 09/26/2023  Time Spent: 10:00am - 10:45am   45 minutes   Treatment Type: Individual Therapy  Reported Symptoms: stress, overwhelm  Mental Status Exam: Appearance:  Casual     Behavior: Appropriate  Motor: Normal  Speech/Language:  Normal Rate  Affect: Appropriate  Mood: normal  Thought process: normal  Thought content:   WNL  Sensory/Perceptual disturbances:   WNL  Orientation: oriented to person, place, time/date, and situation  Attention: Good  Concentration: Good  Memory: WNL  Fund of knowledge:  Good  Insight:   Good  Judgment:  Good  Impulse Control: Good   Risk Assessment: Danger to Self:  No Self-injurious Behavior: No Danger to Others: No Duty to Warn:no Physical Aggression / Violence:No  Access to Firearms a concern: No  Gang Involvement:No   Subjective: Pt present for face-to-face individual therapy via video.  Pt consents to telehealth video session and is aware of limitations and benefits of virtual sessions.  Location of pt: home Location of therapist: home office.   Pt just woke up bc she is home with 4 sick kids.   Pt states May has been a very busy month with her kids' activities.   Pt has felt overwhelmed and tired.  Pt states her mother and husband help her at times but there is still a lot for pt to take care of. Pt is a very involved mom.  She is involved in the kids' school and activities.    Pt has not had as many worries the past couple of weeks bc she has been so busy.   Addressed how the distractions help her but she would like to establish a better balance so she doesn't feel overwhelmed.   Pt states her kids schedules dictate her schedule.    Pt is hoping things will slow down in June when school is out for the summer. Pt talked about her sleep.   She has trouble getting to sleep bc of her mind racing.   Encouraged pt to journal  her thoughts before bed to release what is in her head. Pt is prescribed Effexor  by her PCP Dr. Nathalie Baize.   Pt has been taking Effexor  for a couple of months and is starting to notice some benefit regarding her mood.  Pt also takes Xanax  as needed and states she tends to take it once a week. Worked on self care strategies. Provided supportive therapy.    Interventions: Cognitive Behavioral Therapy and Insight-Oriented  Diagnosis:  F33.1  Plan of Care: Recommend ongoing therapy.   Pt participated in setting treatment goals.   Pt wants to feel less depressed and enjoy things she use to enjoy.  She wants to sleep better. Plan to meet every two weeks.   Pt agrees with treatment plan.    Treatment Plan Client Abilities/Strengths  Pt is bright, engaging, and motivated for therapy.   Client Treatment Preferences  Individual therapy.  Client Statement of Needs  Improve coping skills.  Symptoms  Depressed or irritable mood. Weight gain / emotional eating Diminished interest in or enjoyment of activities. Sleeplessness or hypersomnia. Lack of energy. Low self-esteem.  Problems Addressed  Unipolar Depression Goals 1. Alleviate depressive symptoms and return to previous level of effective functioning. 2. Appropriately grieve the loss in order to normalize mood and to return to previously adaptive level of functioning. Objective Learn and implement behavioral strategies to overcome depression.  Target Date: 2024-09-07 Frequency: Biweekly  Progress: 10 Modality: individual  Related Interventions Engage the client in "behavioral activation," increasing his/her activity level and contact with sources of reward, while identifying processes that inhibit activation.  Use behavioral techniques such as instruction, rehearsal, role-playing, role reversal, as needed, to facilitate activity in the client's daily life; reinforce success. Assist the client in developing skills that increase the likelihood of  deriving pleasure from behavioral activation (e.g., assertiveness skills, developing an exercise plan, less internal/more external focus, increased social involvement); reinforce success. Objective Identify important people in life, past and present, and describe the quality, good and poor, of those relationships. Target Date: 2024-09-07 Frequency: Biweekly  Progress: 10 Modality: individual  Related Interventions Conduct Interpersonal Therapy beginning with the assessment of the client's "interpersonal inventory" of important past and present relationships; develop a case formulation linking depression to grief, interpersonal role disputes, role transitions, and/or interpersonal deficits). Objective Learn and implement problem-solving and decision-making skills. Target Date: 2024-09-07 Frequency: Biweekly  Progress: 10 Modality: individual  Related Interventions Conduct Problem-Solving Therapy using techniques such as psychoeducation, modeling, and role-playing to teach client problem-solving skills (i.e., defining a problem specifically, generating possible solutions, evaluating the pros and cons of each solution, selecting and implementing a plan of action, evaluating the efficacy of the plan, accepting or revising the plan); role-play application of the problem-solving skill to a real life issue. Encourage in the client the development of a positive problem orientation in which problems and solving them are viewed as a natural part of life and not something to be feared, despaired, or avoided. 3. Develop healthy interpersonal relationships that lead to the alleviation and help prevent the relapse of depression. 4. Develop healthy thinking patterns and beliefs about self, others, and the world that lead to the alleviation and help prevent the relapse of depression. 5. Recognize, accept, and cope with feelings of depression. Diagnosis F33.1  Conditions For Discharge Achievement of treatment  goals and objectives   Willey Harrier, LCSW

## 2023-10-19 NOTE — Assessment & Plan Note (Signed)
Encouraged increased hydration and fiber in diet. Daily probiotics. If bowels not moving can use MOM 2 tbls po in 4 oz of warm prune juice by mouth every 2-3 days.  

## 2023-10-19 NOTE — Assessment & Plan Note (Signed)
Monitor and report any concerns, no changes to meds. Encouraged heart healthy diet such as the DASH diet and exercise as tolerated.  Monitor at home and report any concerns.

## 2023-10-19 NOTE — Assessment & Plan Note (Signed)
 On Venlafaxine

## 2023-10-19 NOTE — Assessment & Plan Note (Signed)
 [id number] acceptable, minimize simple carbs. Increase exercise as tolerated.

## 2023-10-20 ENCOUNTER — Ambulatory Visit (INDEPENDENT_AMBULATORY_CARE_PROVIDER_SITE_OTHER): Admitting: Family Medicine

## 2023-10-20 ENCOUNTER — Encounter: Payer: Self-pay | Admitting: Family Medicine

## 2023-10-20 VITALS — BP 134/88 | HR 85 | Resp 16 | Ht 64.0 in | Wt 220.4 lb

## 2023-10-20 DIAGNOSIS — F341 Dysthymic disorder: Secondary | ICD-10-CM | POA: Diagnosis not present

## 2023-10-20 DIAGNOSIS — K5900 Constipation, unspecified: Secondary | ICD-10-CM | POA: Diagnosis not present

## 2023-10-20 DIAGNOSIS — I10 Essential (primary) hypertension: Secondary | ICD-10-CM | POA: Diagnosis not present

## 2023-10-20 DIAGNOSIS — E559 Vitamin D deficiency, unspecified: Secondary | ICD-10-CM

## 2023-10-20 DIAGNOSIS — R739 Hyperglycemia, unspecified: Secondary | ICD-10-CM | POA: Diagnosis not present

## 2023-10-20 DIAGNOSIS — E7800 Pure hypercholesterolemia, unspecified: Secondary | ICD-10-CM

## 2023-10-20 MED ORDER — VENLAFAXINE HCL ER 75 MG PO CP24
ORAL_CAPSULE | ORAL | 3 refills | Status: DC
Start: 2023-10-20 — End: 2024-02-27

## 2023-10-20 MED ORDER — VENLAFAXINE HCL ER 150 MG PO CP24: ORAL_CAPSULE | ORAL | 3 refills | Status: DC

## 2023-10-20 NOTE — Patient Instructions (Signed)
Vitamin D Deficiency Vitamin D deficiency is when your body does not have enough vitamin D. Vitamin D is important to your body because: It helps the body maintain calcium and phosphorus levels. These are important minerals. It plays a role in bone health. It reduces inflammation. It improves the body's defense system (immune system). If vitamin D deficiency is severe, it can cause a condition in which your bones become soft. In adults, this condition is called osteomalacia. In children, this condition is called rickets. What are the causes? This condition may be caused by: Not eating enough foods that contain vitamin D. Not getting enough natural sun exposure. Having certain digestive system diseases that make it difficult for your body to absorb vitamin D. These diseases include Crohn's disease, long-term (chronic) pancreatitis, and cystic fibrosis. Having had a surgery in which a part of the stomach or a part of the small intestine was removed. What increases the risk? You are more likely to develop this condition if you: Are an older adult. Do not spend much time outdoors. Live in a long-term care facility. Have dark skin. Take certain medicines, such as steroid medicines or certain seizure medicines. Are overweight or obese. Have chronic kidney or liver disease. What are the signs or symptoms? In mild cases of vitamin D deficiency, there may not be any symptoms. If the condition is severe, symptoms may include: Bone pain. Muscle pain. Not being able to walk normally (abnormal gait). Broken bones caused by a minor injury. Joint pain. How is this diagnosed? This condition may be diagnosed with blood tests. Imaging tests such as X-rays may also be done to look for changes in the bone. How is this treated? Treatment may include taking supplements as told by your health care provider. Your health care provider will tell you what dose is best for you. Supplements may include: Vitamin  D. Calcium. Follow these instructions at home: Eating and drinking Eat foods that contain vitamin D, such as: Dairy products, cereals, or juices that have vitamin D added to them (are fortified). Check the label. Fish, such as salmon or trout. Eggs. The vitamin D is in the yolk. Mushrooms that were treated with UV light. Beef liver. The items listed above may not be a complete list of foods and beverages you can eat and drink. Contact a dietitian for more information. General instructions Take over-the-counter and prescription medicines only as told by your health care provider. Take supplements only as told by your health care provider. Get regular, safe exposure to natural sunlight. Do not use a tanning bed. Maintain a healthy weight. Lose weight if needed. Keep all follow-up visits. This is important. How is this prevented? You can get vitamin D by: Eating foods that naturally contain vitamin D. Eating or drinking products that have been fortified with vitamin D, such as cereals, juices, and dairy products, including milk. Taking a vitamin D supplement or a multivitamin that contains vitamin D. Being in the sun. Your body naturally makes vitamin D when your skin is exposed to sunlight. Your body changes the sunlight into a form of the vitamin that it can use. Contact a health care provider if: Your symptoms do not go away. You feel nauseous or you vomit. You have fewer bowel movements than usual or are constipated. Summary Vitamin D deficiency is when your body does not have enough vitamin D. Vitamin D helps to keep your bones healthy. Vitamin D deficiency is primarily treated by taking supplements. Your health care  provider will suggest what dose is best for you. You can get vitamin D by eating foods that contain vitamin D, by being in the sun, and by taking a vitamin D supplement or a multivitamin that contains vitamin D. This information is not intended to replace advice given  to you by your health care provider. Make sure you discuss any questions you have with your health care provider. Document Revised: 01/30/2021 Document Reviewed: 01/30/2021 Elsevier Patient Education  2024 [employer].

## 2023-10-20 NOTE — Progress Notes (Signed)
 Subjective:    Patient ID: Megan Hunt, female    DOB: May 19, 1985, 38 y.o.   MRN: [id number]  Chief Complaint  Patient presents with   Medical Management of Chronic Issues    Patient presents today for anxiety follow-up.   Quality Metric Gaps    TDAP, pneumococcal,HPV    HPI Discussed the use of AI scribe software for clinical note transcription with the patient, who gave verbal consent to proceed.  History of Present Illness Megan Hunt is a 38 year old female who presents with ongoing mood concerns and medication management.  She feels 'okay' but has not experienced significant improvement in her mood despite medication changes made approximately two and a half months ago. She struggles with motivation and energy, impacting her ability to engage in daily activities with enthusiasm.  Her medication regimen was adjusted from 75 mg to 150 mg, and she is currently taking 150 mg daily. She denies experiencing any significant side effects from her current medication, such as stomach upset, and reports no issues with chest pain, headaches, or other concerning symptoms.  She is engaged in talk therapy with a counselor named Blaise Bumps, which she continues alongside her medication regimen.  In terms of lifestyle, she attempts to incorporate outdoor activities and sunlight exposure into her routine, although she finds it challenging to maintain consistency. Spending time outside temporarily improves her mood.  She is a stay-at-home mom, which involves significant work despite the lack of a formal paycheck. She does not currently engage in paid employment outside the home.    Past Medical History:  Diagnosis Date   Allergic rhinitis    Anemia    Anxiety and depression    Asthma, mild intermittent 10/27/2008   Qualifier: Diagnosis of  By: Elinor Guardian MD, Raenelle Bumpers    Atypical chest pain    Back pain    Constipation    History of migraines    Hypercholesteremia    Hypertension    no longer  taking meds   Insomnia 05/15/2016   Somatic dysfunction     Past Surgical History:  Procedure Laterality Date   TUBAL LIGATION Bilateral 11/28/2019   Procedure: POST PARTUM TUBAL LIGATION;  Surgeon: Abner Ables, MD;  Location: [facility];  Service: Gynecology;  Laterality: Bilateral;   WISDOM TOOTH EXTRACTION      Family History  Problem Relation Age of Onset   Hypertension Father    Stroke Father        poor speech, weakness   Hypertension Mother    Arthritis Brother    Drug abuse Daughter    Stroke Maternal Grandfather    Hypertension Maternal Grandfather    Cancer Sister    Birth defects Paternal Aunt     Social History   Socioeconomic History   Marital status: Married    Spouse name: Not on file   Number of children: 2   Years of education: Not on file   Highest education level: Not on file  Occupational History   Occupation: [employer]    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    Comment: grad [employer] degree in social work  Tobacco Use   Smoking status: Never   Smokeless tobacco: Never  Vaping Use   Vaping status: Never Used  Substance and Sexual Activity   Alcohol use: No   Drug use: No   Sexual activity: Not Currently    Birth control/protection: Surgical    Comment: lives with husband and 3  small children, no dietary restrictions, stays at home  Other Topics Concern   Not on file  Social History Narrative   Son [name] born 02/2008 and daughter Terrall Ferraris born 06/2009   Melodie Spry born 03/22/2013   Social Drivers of Health   Financial Resource Strain: Not on file  Food Insecurity: Not on file  Transportation Needs: Not on file  Physical Activity: Not on file  Stress: Not on file  Social Connections: Not on file  Intimate Partner Violence: Not on file    Outpatient Medications Prior to Visit  Medication Sig Dispense Refill   ALPRAZolam  (XANAX ) 0.25 MG tablet TAKE 1-2 TABLETS (0.25-0.5 MG TOTAL) BY MOUTH 2 (TWO) TIMES DAILY AS NEEDED FOR ANXIETY.  60 tablet 1   amLODipine  (NORVASC ) 10 MG tablet TAKE 1 TABLET BY MOUTH EVERY DAY 30 tablet 11   cetirizine  (ZYRTEC ) 10 MG tablet Take 1 tablet (10 mg total) by mouth daily. 90 tablet 1   diphenhydramine -acetaminophen  (TYLENOL  PM) 25-500 MG TABS tablet Take 1 tablet by mouth at bedtime as needed.     famotidine  (PEPCID ) 20 MG tablet Take 1 tablet (20 mg total) by mouth daily. 180 tablet 1   fluticasone  (FLONASE ) 50 MCG/ACT nasal spray Place 2 sprays into both nostrils daily. 16 mL 0   polyethylene glycol powder (GLYCOLAX /MIRALAX ) 17 GM/SCOOP powder Take 17 g by mouth 2 (two) times daily as needed for moderate constipation. 3350 g 1   Vitamin D , Ergocalciferol , (DRISDOL ) 1.25 MG (50000 UNIT) CAPS capsule Take 1 capsule (50,000 Units total) by mouth every 7 (seven) days. 4 capsule 4   Wheat Dextrin (BENEFIBER) POWD Take 1 Dose by mouth 2 (two) times daily as needed. 730 g 1   venlafaxine  XR (EFFEXOR  XR) 75 MG 24 hr capsule Take 1 capsule (75 mg total) by mouth daily with breakfast. 30 capsule 0   venlafaxine  XR (EFFEXOR -XR) 150 MG 24 hr capsule Take 1 capsule (150 mg total) by mouth daily with breakfast. 30 capsule 2   No facility-administered medications prior to visit.    Allergies  Allergen Reactions   Abilify  [Aripiprazole ] Other (See Comments)    nightmares    Review of Systems  Constitutional:  Negative for fever and malaise/fatigue.  HENT:  Negative for congestion.   Eyes:  Negative for blurred vision.  Respiratory:  Negative for shortness of breath.   Cardiovascular:  Negative for chest pain, palpitations and leg swelling.  Gastrointestinal:  Negative for abdominal pain, blood in stool and nausea.  Genitourinary:  Negative for dysuria and frequency.  Musculoskeletal:  Negative for falls.  Skin:  Negative for rash.  Neurological:  Negative for dizziness, loss of consciousness and headaches.  Endo/Heme/Allergies:  Negative for environmental allergies.  Psychiatric/Behavioral:   Negative for depression. The patient is not nervous/anxious.        Objective:    Physical Exam Constitutional:      General: She is not in acute distress.    Appearance: Normal appearance. She is well-developed. She is not toxic-appearing.  HENT:     Head: Normocephalic and atraumatic.     Right Ear: External ear normal.     Left Ear: External ear normal.     Nose: Nose normal.   Eyes:     General:        Right eye: No discharge.        Left eye: No discharge.     Conjunctiva/sclera: Conjunctivae normal.   Neck:     Thyroid : No thyromegaly.  Cardiovascular:     Rate and Rhythm: Normal rate and regular rhythm.     Heart sounds: Normal heart sounds. No murmur heard. Pulmonary:     Effort: Pulmonary effort is normal. No respiratory distress.     Breath sounds: Normal breath sounds.  Abdominal:     General: Bowel sounds are normal.     Palpations: Abdomen is soft.     Tenderness: There is no abdominal tenderness. There is no guarding.   Musculoskeletal:        General: Normal range of motion.     Cervical back: Neck supple.  Lymphadenopathy:     Cervical: No cervical adenopathy.   Skin:    General: Skin is warm and dry.   Neurological:     Mental Status: She is alert and oriented to person, place, and time.   Psychiatric:        Mood and Affect: Mood normal.        Behavior: Behavior normal.        Thought Content: Thought content normal.        Judgment: Judgment normal.    BP 134/88   Pulse 85   Resp 16   Ht 5' 4 (1.626 m)   Wt 220 lb 6.4 oz (100 kg)   SpO2 97%   BMI 37.83 kg/m  Wt Readings from Last 3 Encounters:  10/20/23 220 lb 6.4 oz (100 kg)  04/12/22 195 lb (88.5 kg)  01/12/22 201 lb (91.2 kg)    Diabetic Foot Exam - Simple   No data filed    Lab Results  Component Value Date   WBC 9.1 08/12/2023   HGB 12.4 08/12/2023   HCT 38.1 08/12/2023   PLT 295.0 08/12/2023   GLUCOSE 93 08/12/2023   CHOL 224 (H) 08/12/2023   TRIG 95.0  08/12/2023   HDL 60.60 08/12/2023   LDLDIRECT 177.4 06/15/2013   LDLCALC 145 (H) 08/12/2023   ALT 15 08/12/2023   AST 15 08/12/2023   NA 137 08/12/2023   K 3.9 08/12/2023   CL 102 08/12/2023   CREATININE 0.73 08/12/2023   BUN 8 08/12/2023   CO2 26 08/12/2023   TSH 0.53 08/12/2023   HGBA1C 6.0 08/12/2023    Lab Results  Component Value Date   TSH 0.53 08/12/2023   Lab Results  Component Value Date   WBC 9.1 08/12/2023   HGB 12.4 08/12/2023   HCT 38.1 08/12/2023   MCV 84.5 08/12/2023   PLT 295.0 08/12/2023   Lab Results  Component Value Date   NA 137 08/12/2023   K 3.9 08/12/2023   CO2 26 08/12/2023   GLUCOSE 93 08/12/2023   BUN 8 08/12/2023   CREATININE 0.73 08/12/2023   BILITOT 0.4 08/12/2023   ALKPHOS 133 (H) 08/12/2023   AST 15 08/12/2023   ALT 15 08/12/2023   PROT 7.1 08/12/2023   ALBUMIN 4.5 08/12/2023   CALCIUM 9.0 08/12/2023   ANIONGAP 11 12/06/2019   GFR 104.37 08/12/2023   Lab Results  Component Value Date   CHOL 224 (H) 08/12/2023   Lab Results  Component Value Date   HDL 60.60 08/12/2023   Lab Results  Component Value Date   LDLCALC 145 (H) 08/12/2023   Lab Results  Component Value Date   TRIG 95.0 08/12/2023   Lab Results  Component Value Date   CHOLHDL 4 08/12/2023   Lab Results  Component Value Date   HGBA1C 6.0 08/12/2023       Assessment &  Plan:  ANXIETY DEPRESSION Assessment & Plan: On Venlafaxine   Orders: -     Drug Monitoring Panel [phone] , Urine  Hypertension, unspecified type Assessment & Plan: Monitor and report any concerns, no changes to meds. Encouraged heart healthy diet such as the DASH diet and exercise as tolerated.  Monitor at home and report any concerns.   Orders: -     Comprehensive metabolic panel with GFR; Future -     CBC with Differential/Platelet; Future -     TSH; Future  Hyperglycemia Assessment & Plan: hgba1c acceptable, minimize simple carbs. Increase exercise as tolerated.    Orders: -     Hemoglobin A1c; Future  Constipation, unspecified constipation type Assessment & Plan: Encouraged increased hydration and fiber in diet. Daily probiotics. If bowels not moving can use MOM 2 tbls po in 4 oz of warm prune juice by mouth every 2-3 days.   Vitamin D  deficiency -     VITAMIN D  25 Hydroxy (Vit-D Deficiency, Fractures); Future  HYPERCHOLESTEROLEMIA -     Lipid panel; Future  Other orders -     Venlafaxine  HCl ER; 225 mg po daily, take 150 mg tab with 75 mg tab daily  Dispense: 30 capsule; Refill: 3 -     Venlafaxine  HCl ER; Take 225 mg po daily, take 150 mg tab with 75 mg tab daily  Dispense: 30 capsule; Refill: 3    Assessment and Plan Assessment & Plan Anxiety and Depression Mood improvement noted but insufficient. Venlafaxine  XR 150 mg daily. Discussed potential dose increase to 225 mg after three months. Engaged in talk therapy. Recommended lifestyle modifications. Discussed light switch effect and copay implications. - Refill venlafaxine  XR 75 mg and 150 mg. Consider increasing to 225 mg after July 4th if no further improvement. - Continue talk therapy with Blaise Bumps. - Encourage daily walks and lifestyle modifications.  Hypertension Blood pressure well-controlled. Potential improvement with mood enhancement. - Continue current antihypertensive regimen.  Vitamin D  Deficiency Plan to monitor vitamin D  levels post-July 4th. - Order lab work to assess vitamin D  levels after July 4th.     Randie Bustle, MD

## 2023-10-22 LAB — DRUG MONITORING PANEL 376104, URINE
Amphetamines: NEGATIVE ng/mL (ref <500)
Barbiturates: NEGATIVE ng/mL (ref <300)
Benzodiazepines: NEGATIVE ng/mL (ref <100)
Cocaine Metabolite: NEGATIVE ng/mL (ref <150)
Desmethyltramadol: NEGATIVE ng/mL (ref <100)
Opiates: NEGATIVE ng/mL (ref <100)
Oxycodone: NEGATIVE ng/mL (ref <100)
Tramadol: NEGATIVE ng/mL (ref <100)

## 2023-10-22 LAB — DM TEMPLATE

## 2023-10-23 ENCOUNTER — Ambulatory Visit: Payer: Self-pay | Admitting: Family Medicine

## 2023-11-08 ENCOUNTER — Ambulatory Visit (INDEPENDENT_AMBULATORY_CARE_PROVIDER_SITE_OTHER): Admitting: Psychology

## 2023-11-08 DIAGNOSIS — F331 Major depressive disorder, recurrent, moderate: Secondary | ICD-10-CM | POA: Diagnosis not present

## 2023-11-08 NOTE — Progress Notes (Signed)
 [facility] Counselor/Therapist Progress Note  Patient ID: Megan Hunt, MRN: [id number],    Date: 11/08/2023  Time Spent: 9:00am - 9:45am   45 minutes   Treatment Type: Individual Therapy  Reported Symptoms: stress, overwhelm  Mental Status Exam: Appearance:  Casual     Behavior: Appropriate  Motor: Normal  Speech/Language:  Normal Rate  Affect: Appropriate  Mood: normal  Thought process: normal  Thought content:   WNL  Sensory/Perceptual disturbances:   WNL  Orientation: oriented to person, place, time/date, and situation  Attention: Good  Concentration: Good  Memory: WNL  Fund of knowledge:  Good  Insight:   Good  Judgment:  Good  Impulse Control: Good   Risk Assessment: Danger to Self:  No Self-injurious Behavior: No Danger to Others: No Duty to Warn:no Physical Aggression / Violence:No  Access to Firearms a concern: No  Gang Involvement:No   Subjective: Pt present for face-to-face individual therapy via video.  Pt consents to telehealth video session and is aware of limitations and benefits of virtual sessions.  Location of pt: home Location of therapist: home office.   Pt states her stress has decreased because her kids are out of school for the summer.  Pt's mood has improved bc she is able to relax more and take care of herself.  Pt talked about her sleep.   She has trouble getting to sleep bc of her mind racing.   Worked on sleep strategies to help pt quiet her mind at night.  Pt is prescribed Effexor  by her PCP Dr. Carleton.   Her dose was recently increased to help pt with her decreased motivation.  Pt states she still struggles with motivation.   She wants to exercise but can't get motivated.  Educated pt about how we create motivation and encouraged her to break down goals into very small achievable steps.  Pt set the goal to do the treadmill for 3 minutes each day.  Worked on self care strategies. Provided supportive therapy.     Interventions: Cognitive Behavioral Therapy and Insight-Oriented  Diagnosis:  F33.1  Plan of Care: Recommend ongoing therapy.   Pt participated in setting treatment goals.   Pt wants to feel less depressed and enjoy things she use to enjoy.  She wants to sleep better. Plan to meet every two weeks.   Pt agrees with treatment plan.    Treatment Plan Client Abilities/Strengths  Pt is bright, engaging, and motivated for therapy.   Client Treatment Preferences  Individual therapy.  Client Statement of Needs  Improve coping skills.  Symptoms  Depressed or irritable mood. Weight gain / emotional eating Diminished interest in or enjoyment of activities. Sleeplessness or hypersomnia. Lack of energy. Low self-esteem.  Problems Addressed  Unipolar Depression Goals 1. Alleviate depressive symptoms and return to previous level of effective functioning. 2. Appropriately grieve the loss in order to normalize mood and to return to previously adaptive level of functioning. Objective Learn and implement behavioral strategies to overcome depression. Target Date: 2024-09-07 Frequency: Biweekly  Progress: 10 Modality: individual  Related Interventions Engage the client in behavioral activation, increasing his/her activity level and contact with sources of reward, while identifying processes that inhibit activation.  Use behavioral techniques such as instruction, rehearsal, role-playing, role reversal, as needed, to facilitate activity in the client's daily life; reinforce success. Assist the client in developing skills that increase the likelihood of deriving pleasure from behavioral activation (e.g., assertiveness skills, developing an exercise plan, less internal/more external focus, increased  social involvement); reinforce success. Objective Identify important people in life, past and present, and describe the quality, good and poor, of those relationships. Target Date: 2024-09-07 Frequency:  Biweekly  Progress: 10 Modality: individual  Related Interventions Conduct Interpersonal Therapy beginning with the assessment of the client's interpersonal inventory of important past and present relationships; develop a case formulation linking depression to grief, interpersonal role disputes, role transitions, and/or interpersonal deficits). Objective Learn and implement problem-solving and decision-making skills. Target Date: 2024-09-07 Frequency: Biweekly  Progress: 10 Modality: individual  Related Interventions Conduct Problem-Solving Therapy using techniques such as psychoeducation, modeling, and role-playing to teach client problem-solving skills (i.e., defining a problem specifically, generating possible solutions, evaluating the pros and cons of each solution, selecting and implementing a plan of action, evaluating the efficacy of the plan, accepting or revising the plan); role-play application of the problem-solving skill to a real life issue. Encourage in the client the development of a positive problem orientation in which problems and solving them are viewed as a natural part of life and not something to be feared, despaired, or avoided. 3. Develop healthy interpersonal relationships that lead to the alleviation and help prevent the relapse of depression. 4. Develop healthy thinking patterns and beliefs about self, others, and the world that lead to the alleviation and help prevent the relapse of depression. 5. Recognize, accept, and cope with feelings of depression. Diagnosis F33.1  Conditions For Discharge Achievement of treatment goals and objectives   Veva Alma, LCSW

## 2023-12-06 ENCOUNTER — Ambulatory Visit: Admitting: Psychology

## 2024-01-18 ENCOUNTER — Other Ambulatory Visit (INDEPENDENT_AMBULATORY_CARE_PROVIDER_SITE_OTHER)

## 2024-01-18 DIAGNOSIS — E7800 Pure hypercholesterolemia, unspecified: Secondary | ICD-10-CM | POA: Diagnosis not present

## 2024-01-18 DIAGNOSIS — R739 Hyperglycemia, unspecified: Secondary | ICD-10-CM | POA: Diagnosis not present

## 2024-01-18 DIAGNOSIS — I10 Essential (primary) hypertension: Secondary | ICD-10-CM | POA: Diagnosis not present

## 2024-01-18 DIAGNOSIS — R700 Elevated erythrocyte sedimentation rate: Secondary | ICD-10-CM

## 2024-01-18 DIAGNOSIS — E559 Vitamin D deficiency, unspecified: Secondary | ICD-10-CM

## 2024-01-18 LAB — LIPID PANEL
Cholesterol: 227 mg/dL — ABNORMAL HIGH (ref 0–200)
HDL: 64.6 mg/dL (ref >39.00)
LDL Cholesterol: 142 mg/dL — ABNORMAL HIGH (ref 0–99)
NonHDL: 162.19
Total CHOL/HDL Ratio: 4
Triglycerides: 103 mg/dL (ref 0.0–149.0)
VLDL: 20.6 mg/dL (ref 0.0–40.0)

## 2024-01-18 LAB — CBC WITH DIFFERENTIAL/PLATELET
Basophils Absolute: 0.1 K/uL (ref 0.0–0.1)
Basophils Relative: 0.6 % (ref 0.0–3.0)
Eosinophils Absolute: 0.2 K/uL (ref 0.0–0.7)
Eosinophils Relative: 2.3 % (ref 0.0–5.0)
HCT: 38.5 % (ref 36.0–46.0)
Hemoglobin: 12.7 g/dL (ref 12.0–15.0)
Lymphocytes Relative: 24.6 % (ref 12.0–46.0)
Lymphs Abs: 2.3 K/uL (ref 0.7–4.0)
MCHC: 33 g/dL (ref 30.0–36.0)
MCV: 82.6 fl (ref 78.0–100.0)
Monocytes Absolute: 0.8 K/uL (ref 0.1–1.0)
Monocytes Relative: 8.6 % (ref 3.0–12.0)
Neutro Abs: 5.9 K/uL (ref 1.4–7.7)
Neutrophils Relative %: 63.9 % (ref 43.0–77.0)
Platelets: 362 K/uL (ref 150.0–400.0)
RBC: 4.66 Mil/uL (ref 3.87–5.11)
RDW: 13.8 % (ref 11.5–15.5)
WBC: 9.3 K/uL (ref 4.0–10.5)

## 2024-01-18 LAB — HEMOGLOBIN A1C: Hgb A1c MFr Bld: 6.4 % (ref 4.6–6.5)

## 2024-01-18 LAB — TSH: TSH: 0.87 u[IU]/mL (ref 0.35–5.50)

## 2024-01-18 LAB — COMPREHENSIVE METABOLIC PANEL WITH GFR
ALT: 15 U/L (ref 0–35)
AST: 15 U/L (ref 0–37)
Albumin: 4.5 g/dL (ref 3.5–5.2)
Alkaline Phosphatase: 152 U/L — ABNORMAL HIGH (ref 39–117)
BUN: 7 mg/dL (ref 6–23)
CO2: 30 meq/L (ref 19–32)
Calcium: 9.1 mg/dL (ref 8.4–10.5)
Chloride: 100 meq/L (ref 96–112)
Creatinine, Ser: 0.67 mg/dL (ref 0.40–1.20)
GFR: 110.59 mL/min (ref >60.00)
Glucose, Bld: 97 mg/dL (ref 70–99)
Potassium: 3.4 meq/L — ABNORMAL LOW (ref 3.5–5.1)
Sodium: 138 meq/L (ref 135–145)
Total Bilirubin: 0.4 mg/dL (ref 0.2–1.2)
Total Protein: 7.6 g/dL (ref 6.0–8.3)

## 2024-01-18 LAB — SEDIMENTATION RATE: Sed Rate: 21 mm/h — ABNORMAL HIGH (ref 0–20)

## 2024-01-18 LAB — VITAMIN D 25 HYDROXY (VIT D DEFICIENCY, FRACTURES): VITD: 43.22 ng/mL (ref 30.00–100.00)

## 2024-01-19 ENCOUNTER — Telehealth: Payer: Self-pay

## 2024-01-19 DIAGNOSIS — R748 Abnormal levels of other serum enzymes: Secondary | ICD-10-CM

## 2024-01-19 NOTE — Progress Notes (Signed)
 Patient reviewed via MyChart and send a message to provider.

## 2024-01-19 NOTE — Telephone Encounter (Signed)
 Lab order placed per Dr. Domenica

## 2024-01-22 ENCOUNTER — Other Ambulatory Visit: Payer: Self-pay | Admitting: Family Medicine

## 2024-02-01 ENCOUNTER — Ambulatory Visit (INDEPENDENT_AMBULATORY_CARE_PROVIDER_SITE_OTHER): Admitting: Family Medicine

## 2024-02-01 ENCOUNTER — Encounter: Payer: Self-pay | Admitting: Family Medicine

## 2024-02-01 ENCOUNTER — Other Ambulatory Visit (HOSPITAL_COMMUNITY)
Admission: RE | Admit: 2024-02-01 | Discharge: 2024-02-01 | Disposition: A | Discharge status: Home / Self Care | Source: Ambulatory Visit | Attending: Family Medicine | Admitting: Family Medicine

## 2024-02-01 VITALS — BP 147/91 | HR 97 | Wt 211.0 lb

## 2024-02-01 DIAGNOSIS — R61 Generalized hyperhidrosis: Secondary | ICD-10-CM

## 2024-02-01 DIAGNOSIS — Z01419 Encounter for gynecological examination (general) (routine) without abnormal findings: Secondary | ICD-10-CM

## 2024-02-01 DIAGNOSIS — I10 Essential (primary) hypertension: Secondary | ICD-10-CM

## 2024-02-01 DIAGNOSIS — R7303 Prediabetes: Secondary | ICD-10-CM | POA: Diagnosis not present

## 2024-02-03 LAB — ESTROGENS, TOTAL: Estrogen: 237 pg/mL

## 2024-02-03 LAB — FOLLICLE STIMULATING HORMONE: FSH: 3.9 m[IU]/mL

## 2024-02-03 NOTE — Progress Notes (Signed)
 ANNUAL EXAM Patient name: Megan Hunt MRN [id number]  Date of birth: 1986/02/19 Chief Complaint:   Annual Exam  History of Present Illness:   Megan Hunt is a 38 y.o.  [phone]  female  being seen today for a routine annual exam.  Current complaints: some hot flashes and night sweats. Happens almost every night.  Patient's last menstrual period was 01/08/2024 (exact date).    Last pap 2021. Results were: normal. H/O abnormal pap: no Last mammogram: n/a     02/01/2024    9:32 AM 10/20/2023    9:06 AM 04/12/2022   11:45 AM 01/12/2022   10:27 AM 02/24/2021    1:15 PM  Depression screen PHQ 2/9  Decreased Interest 2 2 3  0 0  Down, Depressed, Hopeless 2 1 1  0 0  PHQ - 2 Score 4 3 4  0 0  Altered sleeping 2 3 1 1 1   Tired, decreased energy 3 3 3 1 1   Change in appetite 3 2 0 0 1  Feeling bad or failure about yourself  2 1 0 1 0  Trouble concentrating 3 1 0 0 1  Moving slowly or fidgety/restless 1 0 0 0 0  Suicidal thoughts 0 0 0 0 0  PHQ-9 Score 18 13 8 3 4   Difficult doing work/chores  Somewhat difficult Not difficult at all Not difficult at all Not difficult at all        02/01/2024    9:33 AM 10/20/2023    9:06 AM  GAD 7 : Generalized Anxiety Score  Nervous, Anxious, on Edge 2 1  Control/stop worrying 2 1  Worry too much - different things 3 1  Trouble relaxing 3 1  Restless 1 1  Easily annoyed or irritable 3 1  Afraid - awful might happen 1 1  Total GAD 7 Score 15 7  Anxiety Difficulty  Somewhat difficult     Review of Systems:   Pertinent items are noted in HPI Denies any headaches, blurred vision, fatigue, shortness of breath, chest pain, abdominal pain, abnormal vaginal discharge/itching/odor/irritation, problems with periods, bowel movements, urination, or intercourse unless otherwise stated above. Pertinent History Reviewed:  Reviewed past medical,surgical, social and family history.  Reviewed problem list, medications and allergies. Physical  Assessment:   Vitals:   02/01/24 0922 02/01/24 0928  BP: (!) 145/88 (!) 147/91  Pulse: 98 97  Weight: 211 lb (95.7 kg)   Body mass index is 36.22 kg/m.        Physical Examination:   General appearance - well appearing, and in no distress  Mental status - alert, oriented to person, place, and time  Psych:  She has a normal mood and affect  Skin - warm and dry, normal color, no suspicious lesions noted  Chest - effort normal, all lung fields clear to auscultation bilaterally  Heart - normal rate and regular rhythm  Neck:  midline trachea, no thyromegaly or nodules  Breasts - breasts appear normal, no suspicious masses, no skin or nipple changes or axillary nodes  Abdomen - soft, nontender, nondistended, no masses or organomegaly  Pelvic - VULVA: normal appearing vulva with no masses, tenderness or lesions  VAGINA: normal appearing vagina with normal color and discharge, no lesions  CERVIX: normal appearing cervix without discharge or lesions, no CMT  Thin prep pap is done with HR HPV cotesting  UTERUS: uterus is felt to be normal size, shape, consistency and nontender   ADNEXA: No adnexal masses or tenderness noted.  Extremities:  No swelling or varicosities noted  Chaperone present for exam  Assessment & Plan:  1. Well woman exam with routine gynecological exam (Primary) - Cytology - PAP( [place])  2. Primary hypertension Had recent appt with PCP - BP better controlled  3. Night sweats Check estrogen and FSH. Potential HRT for perimenopause. - Estrogens , Total - FSH  4. Prediabetes May benefit from treatment with either metformin or GLP1. Should qualify for GLP1 due to BMI of 36 and HTN. GLP1 may reduce perimenopausal symptoms in addition to glycemic control and weight loss. Will discuss with PCP.  Labs/procedures today:   Orders Placed This Encounter  Procedures   Estrogens , Total   FSH    Meds: No orders of the defined types were placed in this  encounter.   Follow-up: No follow-ups on file.  Shenique Childers J Arrielle Mcginn, DO 02/03/2024 10:55 AM

## 2024-02-06 ENCOUNTER — Telehealth: Payer: Self-pay

## 2024-02-06 ENCOUNTER — Other Ambulatory Visit

## 2024-02-06 DIAGNOSIS — R748 Abnormal levels of other serum enzymes: Secondary | ICD-10-CM

## 2024-02-06 NOTE — Telephone Encounter (Signed)
-----   Message from Harlene Horton sent at 02/05/2024 10:23 AM EDT ----- Regarding: RE: Prediabetes Greetings good thought but do worry that her insurance will not play nice. Have included some Nurse assistants so she can have repeat labs in December since her A1C earlier this month was 6.4. if it rises more will help get med covered. Will have them order repeat labs (same as September labs) 90 days after in December and then have them make sure she has a f/u appt to discuss. Thanks ----- Message ----- From: Barbra Lang PARAS, DO Sent: 02/03/2024  11:26 AM EDT To: Harlene DELENA Horton, MD Subject: Prediabetes                                    Harlene,  I recently saw our mutual patient - she has been having a lot of perimenopausal symptoms. I saw that she was prediabetic - any thoughts about treatment for this? There is some data about GLP1 use and improving perimenopausal symptoms. She may be a good candidate for this.  Jacob

## 2024-02-06 NOTE — Telephone Encounter (Signed)
 Called patient and she scheduled appointment on 04/19/24 @ 11:20 AM.

## 2024-02-07 LAB — CYTOLOGY - PAP
Comment: NEGATIVE
Diagnosis: UNDETERMINED — AB
High risk HPV: NEGATIVE

## 2024-02-08 ENCOUNTER — Ambulatory Visit: Payer: Self-pay | Admitting: Family Medicine

## 2024-02-08 ENCOUNTER — Telehealth: Payer: Self-pay | Admitting: *Deleted

## 2024-02-08 NOTE — Telephone Encounter (Signed)
-----   Message from Harlene Horton sent at 02/05/2024 10:23 AM EDT ----- Regarding: RE: Prediabetes Greetings good thought but do worry that her insurance will not play nice. Have included some Nurse assistants so she can have repeat labs in December since her A1C earlier this month was 6.4. if it rises more will help get med covered. Will have them order repeat labs (same as September labs) 90 days after in December and then have them make sure she has a f/u appt to discuss. Thanks ----- Message ----- From: Barbra Lang PARAS, DO Sent: 02/03/2024  11:26 AM EDT To: Harlene DELENA Horton, MD Subject: Prediabetes                                    Harlene,  I recently saw our mutual patient - she has been having a lot of perimenopausal symptoms. I saw that she was prediabetic - any thoughts about treatment for this? There is some data about GLP1 use and improving perimenopausal symptoms. She may be a good candidate for this.  Jacob

## 2024-02-10 ENCOUNTER — Ambulatory Visit: Payer: Self-pay | Admitting: Family

## 2024-02-10 LAB — ALKALINE PHOSPHATASE, ISOENZYMES
Alkaline Phosphatase: 61 IU/L (ref 41–116)
BONE FRACTION: 44 % (ref 14–68)
INTESTINAL FRAC.: 1 % (ref 0–18)
LIVER FRACTION: 55 % (ref 18–85)

## 2024-02-20 ENCOUNTER — Ambulatory Visit (HOSPITAL_BASED_OUTPATIENT_CLINIC_OR_DEPARTMENT_OTHER)
Admission: RE | Admit: 2024-02-20 | Discharge: 2024-02-20 | Disposition: A | Discharge status: Home / Self Care | Source: Ambulatory Visit | Attending: Medical | Admitting: Medical

## 2024-02-20 ENCOUNTER — Ambulatory Visit: Payer: Self-pay | Admitting: Medical

## 2024-02-20 ENCOUNTER — Ambulatory Visit: Admitting: Medical

## 2024-02-20 ENCOUNTER — Encounter: Payer: Self-pay | Admitting: Family Medicine

## 2024-02-20 VITALS — BP 162/100 | HR 90 | Temp 98.3°F | Resp 15 | Ht 64.0 in | Wt 214.4 lb

## 2024-02-20 DIAGNOSIS — H539 Unspecified visual disturbance: Secondary | ICD-10-CM

## 2024-02-20 DIAGNOSIS — M2550 Pain in unspecified joint: Secondary | ICD-10-CM

## 2024-02-20 DIAGNOSIS — M791 Myalgia, unspecified site: Secondary | ICD-10-CM | POA: Diagnosis not present

## 2024-02-20 DIAGNOSIS — S46812A Strain of other muscles, fascia and tendons at shoulder and upper arm level, left arm, initial encounter: Secondary | ICD-10-CM | POA: Diagnosis not present

## 2024-02-20 DIAGNOSIS — M797 Fibromyalgia: Secondary | ICD-10-CM

## 2024-02-20 DIAGNOSIS — R110 Nausea: Secondary | ICD-10-CM

## 2024-02-20 DIAGNOSIS — G4489 Other headache syndrome: Secondary | ICD-10-CM | POA: Diagnosis present

## 2024-02-20 DIAGNOSIS — R5383 Other fatigue: Secondary | ICD-10-CM | POA: Diagnosis not present

## 2024-02-20 LAB — SEDIMENTATION RATE: Sed Rate: 20 mm/h (ref 0–20)

## 2024-02-20 LAB — CBC WITH DIFFERENTIAL/PLATELET
Basophils Absolute: 0 K/uL (ref 0.0–0.1)
Basophils Relative: 0.4 % (ref 0.0–3.0)
Eosinophils Absolute: 0.1 K/uL (ref 0.0–0.7)
Eosinophils Relative: 0.7 % (ref 0.0–5.0)
HCT: 38.5 % (ref 36.0–46.0)
Hemoglobin: 12.5 g/dL (ref 12.0–15.0)
Lymphocytes Relative: 26 % (ref 12.0–46.0)
Lymphs Abs: 2.3 K/uL (ref 0.7–4.0)
MCHC: 32.4 g/dL (ref 30.0–36.0)
MCV: 83.4 fl (ref 78.0–100.0)
Monocytes Absolute: 0.6 K/uL (ref 0.1–1.0)
Monocytes Relative: 6.6 % (ref 3.0–12.0)
Neutro Abs: 5.7 K/uL (ref 1.4–7.7)
Neutrophils Relative %: 66.3 % (ref 43.0–77.0)
Platelets: 358 K/uL (ref 150.0–400.0)
RBC: 4.62 Mil/uL (ref 3.87–5.11)
RDW: 13.9 % (ref 11.5–15.5)
WBC: 8.7 K/uL (ref 4.0–10.5)

## 2024-02-20 LAB — COMPREHENSIVE METABOLIC PANEL WITH GFR
ALT: 17 U/L (ref 0–35)
AST: 16 U/L (ref 0–37)
Albumin: 4.7 g/dL (ref 3.5–5.2)
Alkaline Phosphatase: 189 U/L — ABNORMAL HIGH (ref 39–117)
BUN: 7 mg/dL (ref 6–23)
CO2: 29 meq/L (ref 19–32)
Calcium: 9.1 mg/dL (ref 8.4–10.5)
Chloride: 100 meq/L (ref 96–112)
Creatinine, Ser: 0.66 mg/dL (ref 0.40–1.20)
GFR: 110.92 mL/min (ref >60.00)
Glucose, Bld: 111 mg/dL — ABNORMAL HIGH (ref 70–99)
Potassium: 3.6 meq/L (ref 3.5–5.1)
Sodium: 139 meq/L (ref 135–145)
Total Bilirubin: 0.3 mg/dL (ref 0.2–1.2)
Total Protein: 7.5 g/dL (ref 6.0–8.3)

## 2024-02-20 LAB — C-REACTIVE PROTEIN: CRP: 0.8 mg/dL (ref 0.5–20.0)

## 2024-02-20 LAB — POCT URINE PREGNANCY: Preg Test, Ur: NEGATIVE

## 2024-02-20 LAB — LIPASE: Lipase: 23 U/L (ref 11.0–59.0)

## 2024-02-20 LAB — VITAMIN B12: Vitamin B-12: 242 pg/mL (ref 211–911)

## 2024-02-20 LAB — CK: Total CK: 106 U/L (ref 17–177)

## 2024-02-20 MED ORDER — CHLORTHALIDONE 25 MG PO TABS: 25.0000 mg | ORAL_TABLET | Freq: Every day | ORAL | 11 refills | Status: AC

## 2024-02-20 MED ORDER — ONDANSETRON HCL 4 MG PO TABS: 4.0000 mg | ORAL_TABLET | Freq: Three times a day (TID) | ORAL | 0 refills | Status: DC | PRN

## 2024-02-20 MED ORDER — CYCLOBENZAPRINE HCL 5 MG PO TABS: ORAL_TABLET | ORAL | 0 refills | Status: DC

## 2024-02-20 NOTE — Progress Notes (Addendum)
 Subjective:    Patient ID: Megan Hunt, female    DOB: 08-Dec-1985, 38 y.o.   MRN: [id number]  HPI  Megan Hunt is a 38 year old female with hypertension who presents with nausea, muscle aches, and visual disturbances. Symptoms for one week.  Symptoms began approximately seven days ago with persistent nausea, particularly after eating, without vomiting. No heartburn or belching is present.  Muscle aches are primarily in the legs, including the calves and thighs, and have not improved with Tylenol .  Headaches have been increasing in intensity over the past week, currently rated at 6.5 out of 10, and are associated with neck pain, particularly on the left side. She denies a history of similar headaches, although she has had headaches in the past associated with high blood pressure, which resolved with medication adherence. When bp levels improved.  She experiences visual disturbances, described as seeing 'a bunch of little dots everywhere' in both eyes, lasting a few minutes and occurring frequently, sometimes more than once a day, for about a week. These episodes are associated with blurred vision. Not occuring presently. Last time occured was yesterday.  Her last menstrual cycle began on October 4th and was normal, lasting five days. Her cycles are typically regular, occurring every 28 days.  She has a history of high blood pressure and is currently taking Norvasc  10 mg daily. Despite medication adherence, she has noted elevated blood pressure readings recently.   She also mentions a history of fatigue and has undergone hormone testing and tried various medications to address this issue.  She also mentions bilateral hand pain chronically.     Review of Systems  Respiratory:  Negative for cough, chest tightness and wheezing.   Cardiovascular:  Negative for chest pain and palpitations.  Gastrointestinal:  Positive for nausea and vomiting. Negative for abdominal pain.   Musculoskeletal:  Positive for arthralgias, myalgias and neck pain.       Soreness of leg  Skin:  Negative for rash.  Neurological:  Positive for headaches.  Hematological:  Negative for adenopathy. Does not bruise/bleed easily.  Psychiatric/Behavioral:  Negative for behavioral problems and hallucinations.      Past Medical History:  Diagnosis Date   Allergic rhinitis    Anemia    Anxiety and depression    Asthma, mild intermittent 10/27/2008   Qualifier: Diagnosis of  By: Christi MD, Glendia CHRISTELLA    Atypical chest pain    Back pain    Constipation    History of migraines    Hypercholesteremia    Hypertension    no longer taking meds   Insomnia 05/15/2016   Somatic dysfunction      Social History   Socioeconomic History   Marital status: Married    Spouse name: Not on file   Number of children: 2   Years of education: Not on file   Highest education level: Not on file  Occupational History   Occupation: [employer]    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    Comment: grad [employer] degree in social work  Tobacco Use   Smoking status: Never   Smokeless tobacco: Never  Vaping Use   Vaping status: Never Used  Substance and Sexual Activity   Alcohol use: No   Drug use: No   Sexual activity: Not Currently    Birth control/protection: Surgical    Comment: lives with husband and 3 small children, no dietary restrictions, stays at home  Other Topics Concern   Not  on file  Social History Narrative   Son [place] born 02/2008 and daughter vicie born 06/2009   [name] born 03/22/2013   Social Drivers of Health   Financial Resource Strain: Not on file  Food Insecurity: Not on file  Transportation Needs: Not on file  Physical Activity: Not on file  Stress: Not on file  Social Connections: Not on file  Intimate Partner Violence: Not on file    Past Surgical History:  Procedure Laterality Date   TUBAL LIGATION Bilateral 11/28/2019   Procedure: POST PARTUM TUBAL LIGATION;   Surgeon: Eldonna Suzen Octave, MD;  Location: [facility];  Service: Gynecology;  Laterality: Bilateral;   WISDOM TOOTH EXTRACTION      Family History  Problem Relation Age of Onset   Hypertension Father    Stroke Father        poor speech, weakness   Hypertension Mother    Arthritis Brother    Drug abuse Daughter    Stroke Maternal Grandfather    Hypertension Maternal Grandfather    Cancer Sister    Birth defects Paternal Aunt     Allergies  Allergen Reactions   Abilify  [Aripiprazole ] Other (See Comments)    nightmares    Current Outpatient Medications on File Prior to Visit  Medication Sig Dispense Refill   ALPRAZolam  (XANAX ) 0.25 MG tablet TAKE 1-2 TABLETS (0.25-0.5 MG TOTAL) BY MOUTH 2 (TWO) TIMES DAILY AS NEEDED FOR ANXIETY. 60 tablet 1   amLODipine  (NORVASC ) 10 MG tablet Take 1 tablet (10 mg total) by mouth daily. Needs appt 30 tablet 0   venlafaxine  XR (EFFEXOR  XR) 75 MG 24 hr capsule 225 mg po daily, take 150 mg tab with 75 mg tab daily 30 capsule 3   venlafaxine  XR (EFFEXOR -XR) 150 MG 24 hr capsule Take 225 mg po daily, take 150 mg tab with 75 mg tab daily 30 capsule 3   Vitamin D , Ergocalciferol , (DRISDOL ) 1.25 MG (50000 UNIT) CAPS capsule Take 1 capsule (50,000 Units total) by mouth every 7 (seven) days. 4 capsule 4   cetirizine  (ZYRTEC ) 10 MG tablet Take 1 tablet (10 mg total) by mouth daily. (Patient not taking: Reported on 02/20/2024) 90 tablet 1   No current facility-administered medications on file prior to visit.    BP (!) 162/100   Pulse 90   Temp 98.3 F (36.8 C) (Oral)   Resp 15   Ht 5' 4 (1.626 m)   Wt 214 lb 6.4 oz (97.3 kg)   LMP 02/11/2024 (Exact Date)   SpO2 98%   BMI 36.80 kg/m        Objective:   Physical Exam  General Mental Status- Alert. General Appearance- Not in acute distress.   Skin General: Color- Normal Color. Moisture- Normal Moisture.  Neck  No JVD. Left trapezius tenderness to palpation.  Chest and Lung  Exam Auscultation: Breath Sounds:-Normal.  Cardiovascular Auscultation:Rythm- Regular. Murmurs & Other Heart Sounds:Auscultation of the heart reveals- No Murmurs.  Abdomen Inspection:-Inspeection Normal. Palpation/Percussion:Note:No mass. Palpation and Percussion of the abdomen reveal- Non Tender, Non Distended + BS, no rebound or guarding.    Neurologic Cranial Nerve exam:- CN III-XII intact(No nystagmus), symmetric smile. Strength:- 5/5 equal and symmetric strength both upper and lower extremities.   Heent- no sinus pressure. Ears canals clear and normal tms    Assessment & Plan:   Patient Instructions  Hypertension with transient visual disturbance and headache Hypertension with visual disturbances and headaches, likely due to elevated blood pressure. No black spots  presently. - Order CT head without contrast to evaluate neurological causes. - Perform basic vision check. - If CT head negative, initiate chlorthalidone 25 mg daily with current amlodipine  10 mg daily.  -If  ct negative but worsen signs/symptoms then ED evaluation - Consider low dose Flexeril  5 mg at night for trapezius pain post-CT results. - Schedule follow-up for blood pressure check with Doctor Carleton or Harlene.Yacopino NP  Trapezius muscle pain Left trapezius pain, possibly due to muscle tension. - Consider low dose Flexeril  5 mg at night for muscle pain post-CT results.  Nausea and myalgia(hx of fibromyalgia in chart) Pain maybe related) Nausea and myalgia for a week, nausea postprandial, muscle aches in legs. - Order CK and sed rate for muscle inflammation or damage. -urine pregnancy negative - Prescribe Zofran  for nausea.  Fatigue Chronic fatigue, etiology unclear. - Order CBC, metabolic panel, and B12 level.  Hand pain Chronic hand pain, severe, previous muscle relaxers ineffective. Differential includes inflammatory or rheumatologic conditions. - Order inflammation labs: sed rate, C-reactive  protein, ANA, rheumatoid factor.  Follow up Wednesday with Dr. Domenica or with Jessica Yacopino   Velvia Mehrer, PA-C    I personally spent a total of 44 minutes in the care of the patient today including performing a medically appropriate exam/evaluation, counseling and educating, placing orders, and documenting clinical information in the EHR.

## 2024-02-20 NOTE — Progress Notes (Signed)
Left message on machine to call back  Also mychart message sent.

## 2024-02-20 NOTE — Addendum Note (Signed)
 Addended by: DORINA DALLAS HERO on: 02/20/2024 02:55 PM   Modules accepted: Orders

## 2024-02-20 NOTE — Patient Instructions (Signed)
 Hypertension with transient visual disturbance and headache Hypertension with visual disturbances and headaches, likely due to elevated blood pressure. No black spots presently. - Order CT head without contrast to evaluate neurological causes. - Perform basic vision check. - If CT head negative, initiate chlorthalidone 25 mg daily with current amlodipine  10 mg daily.  -If  ct negative but worsen signs/symptoms then ED evaluation - Consider low dose Flexeril  5 mg at night for trapezius pain post-CT results. - Schedule follow-up for blood pressure check with Doctor Carleton or Harlene.Yacopino NP  Trapezius muscle pain Left trapezius pain, possibly due to muscle tension. - Consider low dose Flexeril  5 mg at night for muscle pain post-CT results.  Nausea and myalgia(hx of fibromyalgia in chart) Pain maybe related) Nausea and myalgia for a week, nausea postprandial, muscle aches in legs. - Order CK and sed rate for muscle inflammation or damage. -urine pregnancy negative - Prescribe Zofran  for nausea.  Fatigue Chronic fatigue, etiology unclear. - Order CBC, metabolic panel, and B12 level.  Hand pain Chronic hand pain, severe, previous muscle relaxers ineffective. Differential includes inflammatory or rheumatologic conditions. - Order inflammation labs: sed rate, C-reactive protein, ANA, rheumatoid factor.  Follow up Wednesday with Dr. Domenica or with Jessica Yacopino

## 2024-02-21 ENCOUNTER — Other Ambulatory Visit: Payer: Self-pay | Admitting: Family Medicine

## 2024-02-21 DIAGNOSIS — E538 Deficiency of other specified B group vitamins: Secondary | ICD-10-CM | POA: Insufficient documentation

## 2024-02-21 NOTE — Assessment & Plan Note (Signed)
 Supplement and monitor

## 2024-02-21 NOTE — Assessment & Plan Note (Signed)
 Supplement and monitor.  Check intrinsic factor today. Receiving B12 injections. B12 injection given today during OV. Continue B12 injections as per current schedule.

## 2024-02-21 NOTE — Assessment & Plan Note (Signed)
 Stable on current medications

## 2024-02-21 NOTE — Assessment & Plan Note (Signed)
 [id number] acceptable, minimize simple carbs. Increase exercise as tolerated.

## 2024-02-21 NOTE — Progress Notes (Unsigned)
 Subjective:     Patient ID: Megan Hunt, female    DOB: 12/14/1985, 38 y.o.   MRN: [id number]  No chief complaint on file.   HPI  Discussed the use of AI scribe software for clinical note transcription with the patient, who gave verbal consent to proceed.  History of Present Illness Megan Hunt is a 38 year old female with hypertension who presents with daily headaches.  Headaches occur daily, starting on one side and radiating to the back of the head, often around the eyes, with occasional visual disturbances such as seeing dots. Denies photophobia, but closing her eyes provides slight relief. She has used Tylenol  Extra Strength (500 mg) BID without relief.  Recently prescribed Hygroton 25 mg last OV. She has not monitored her blood pressure at home due to a malfunctioning cuff. Anxiety and depression are treated with Effexor , though symptoms  of anxiety and depression high. Stress and anxiety may exacerbate her headaches.  She receives weekly B12 injections and takes vitamin D  supplements, including 50,000 IU weekly and 2,000 IU daily.   PMHx-fibromyalgia, migraines  Hypertension-amlodipine  10 mg daily, chlorthalidone 25 mg daily  Anxiety/depression-  venlafaxine  to 225 mg p.o. daily     02/01/2024    9:33 AM 10/20/2023    9:06 AM  GAD 7 : Generalized Anxiety Score  Nervous, Anxious, on Edge 2 1  Control/stop worrying 2 1  Worry too much - different things 3 1  Trouble relaxing 3 1  Restless 1 1  Easily annoyed or irritable 3 1  Afraid - awful might happen 1 1  Total GAD 7 Score 15 7  Anxiety Difficulty  Somewhat difficult        02/01/2024    9:32 AM 10/20/2023    9:06 AM 04/12/2022   11:45 AM 01/12/2022   10:27 AM 02/24/2021    1:15 PM  Depression screen PHQ 2/9  Decreased Interest 2 2 3  0 0  Down, Depressed, Hopeless 2 1 1  0 0  PHQ - 2 Score 4 3 4  0 0  Altered sleeping 2 3 1 1 1   Tired, decreased energy 3 3 3 1 1   Change in appetite 3 2 0 0 1  Feeling  bad or failure about yourself  2 1 0 1 0  Trouble concentrating 3 1 0 0 1  Moving slowly or fidgety/restless 1 0 0 0 0  Suicidal thoughts 0 0 0 0 0  PHQ-9 Score 18 13 8 3 4   Difficult doing work/chores  Somewhat difficult Not difficult at all Not difficult at all Not difficult at all     Low Vitamin B12 Lab Results  Component Value Date   VITAMINB12 242 02/20/2024   Receiving B12 injections for 1x week 1 month, 1 txper month 5 months   Patient denies fever, chills, SOB, CP, palpitations, dyspnea, edema,  vision changes, N/V/D, abdominal pain, urinary symptoms, rash, weight changes, and recent illness or hospitalizations.   History of Present Illness              There are no preventive care reminders to display for this patient.  Past Medical History:  Diagnosis Date   Allergic rhinitis    Anemia    Anxiety and depression    Asthma, mild intermittent 10/27/2008   Qualifier: Diagnosis of  By: Christi MD, Glendia CHRISTELLA    Atypical chest pain    Back pain    Constipation    History of migraines  Hypercholesteremia    Hypertension    no longer taking meds   Insomnia 05/15/2016   Somatic dysfunction     Past Surgical History:  Procedure Laterality Date   TUBAL LIGATION Bilateral 11/28/2019   Procedure: POST PARTUM TUBAL LIGATION;  Surgeon: Eldonna Suzen Octave, MD;  Location: [facility];  Service: Gynecology;  Laterality: Bilateral;   WISDOM TOOTH EXTRACTION      Family History  Problem Relation Age of Onset   Hypertension Father    Stroke Father        poor speech, weakness   Hypertension Mother    Arthritis Brother    Drug abuse Daughter    Stroke Maternal Grandfather    Hypertension Maternal Grandfather    Cancer Sister    Birth defects Paternal Aunt     Social History   Socioeconomic History   Marital status: Married    Spouse name: Not on file   Number of children: 2   Years of education: Not on file   Highest education level: Not on file  Occupational  History   Occupation: [employer]    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    Comment: grad [employer] degree in social work  Tobacco Use   Smoking status: Never   Smokeless tobacco: Never  Vaping Use   Vaping status: Never Used  Substance and Sexual Activity   Alcohol use: No   Drug use: No   Sexual activity: Not Currently    Birth control/protection: Surgical    Comment: lives with husband and 3 small children, no dietary restrictions, stays at home  Other Topics Concern   Not on file  Social History Narrative   Son [place] born 02/2008 and daughter vicie born 06/2009   [name] born 03/22/2013   Social Drivers of Health   Financial Resource Strain: Not on file  Food Insecurity: Not on file  Transportation Needs: Not on file  Physical Activity: Not on file  Stress: Not on file  Social Connections: Not on file  Intimate Partner Violence: Not on file    Outpatient Medications Prior to Visit  Medication Sig Dispense Refill   ALPRAZolam  (XANAX ) 0.25 MG tablet TAKE 1-2 TABLETS (0.25-0.5 MG TOTAL) BY MOUTH 2 (TWO) TIMES DAILY AS NEEDED FOR ANXIETY. 60 tablet 1   amLODipine  (NORVASC ) 10 MG tablet Take 1 tablet (10 mg total) by mouth daily. 90 tablet 0   cetirizine  (ZYRTEC ) 10 MG tablet Take 1 tablet (10 mg total) by mouth daily. (Patient not taking: Reported on 02/20/2024) 90 tablet 1   chlorthalidone (HYGROTON) 25 MG tablet Take 1 tablet (25 mg total) by mouth daily. 30 tablet 11   cyclobenzaprine  (FLEXERIL ) 5 MG tablet 1 tab po q hs prn trapezius pain 3 tablet 0   ondansetron  (ZOFRAN ) 4 MG tablet Take 1 tablet (4 mg total) by mouth every 8 (eight) hours as needed for nausea or vomiting. 20 tablet 0   venlafaxine  XR (EFFEXOR  XR) 75 MG 24 hr capsule 225 mg po daily, take 150 mg tab with 75 mg tab daily 30 capsule 3   venlafaxine  XR (EFFEXOR -XR) 150 MG 24 hr capsule Take 225 mg po daily, take 150 mg tab with 75 mg tab daily 30 capsule 3   Vitamin D , Ergocalciferol , (DRISDOL ) 1.25 MG  (50000 UNIT) CAPS capsule Take 1 capsule (50,000 Units total) by mouth every 7 (seven) days. 4 capsule 4   No facility-administered medications prior to visit.    Allergies  Allergen Reactions  Abilify  [Aripiprazole ] Other (See Comments)    nightmares    ROS    See HPI Objective:    Physical Exam  General: No acute distress. Awake and conversant. +obese Eyes: Normal conjunctiva, anicteric. Round symmetric pupils.  ENT: Hearing grossly intact. No nasal discharge.  Respiratory: CTAB. Respirations are non-labored. No wheezing.  Skin: Warm. No rashes or ulcers.  Psych: Alert and oriented. Cooperative CV: RRR. No murmur. No lower extremity edema.  MSK: Normal ambulation. No clubbing or cyanosis.  Neuro:  CN II-XII grossly normal.    BP 138/89   Pulse 90   Temp 98 F (36.7 C)   Ht 5' 4 (1.626 m)   Wt 213 lb 9.6 oz (96.9 kg)   LMP 02/11/2024 (Exact Date)   SpO2 97%   BMI 36.66 kg/m  Wt Readings from Last 3 Encounters:  02/22/24 213 lb 9.6 oz (96.9 kg)  02/20/24 214 lb 6.4 oz (97.3 kg)  02/01/24 211 lb (95.7 kg)       Assessment & Plan:   Problem List Items Addressed This Visit     ANXIETY DEPRESSION - Primary   High anxiety and depression scores despite Effexor  treatment. Symptoms may contribute to headaches and fatigue. Concerns for possible mood disorder. Refer to behavioral health for further evaluation and management. . Provide list of local counseling and therapy services in [place].      Relevant Orders   Ambulatory referral to Behavioral Health   HTN (hypertension)   Well controlled, no changes to meds. Encouraged heart healthy diet such as the DASH diet and exercise as tolerated.  Monitor BP at home RTC for BP >140/90      Hyperglycemia   hgba1c acceptable, minimize simple carbs. Increase exercise as tolerated.       Low serum vitamin B12   Supplement and monitor.  Check intrinsic factor today. Receiving B12 injections. B12 injection given today during  OV. Continue B12 injections as per current schedule.      Relevant Orders   Intrinsic Factor Antibodies   Migraine   Chronic daily headaches with possible migraine features, exacerbated by stress and anxiety. Current analgesics ineffective. Recent CT shows no acute findings. Encourage adequate hydration, at least 64 oz daily. - Prescribe ibuprofen  600 mg every six hours as needed. - Continue Tylenol  as needed with ibuprofen . - Encourage hydration and stress management. - Refer to behavioral health for anxiety and depression management.      Relevant Medications   ibuprofen  (ADVIL ) 600 MG tablet   Obesity (BMI 35.0-39.9 without comorbidity)   Encouraged DASH or MIND diet, decrease po intake and increase exercise as tolerated. Needs 7-8 hours of sleep nightly. Avoid trans fats, eat small, frequent meals every 4-5 hours with lean proteins, complex carbs and healthy fats. Minimize simple carbs, high fat foods and processed foods        Vitamin D  deficiency   Supplement and monitor      Relevant Orders   Vitamin D  (25 hydroxy)   Other Visit Diagnoses       Mood disorder       Relevant Orders   Ambulatory referral to Behavioral Health     Elevated alkaline phosphatase level       Relevant Orders   Alkaline phosphatase, isoenzymes       I am having Jarvis M. Moshier start on ibuprofen . I am also having her maintain her cetirizine , ALPRAZolam , Vitamin D  (Ergocalciferol ), venlafaxine  XR, venlafaxine  XR, ondansetron , chlorthalidone, cyclobenzaprine , and amLODipine . We administered  cyanocobalamin.  Meds ordered this encounter  Medications   cyanocobalamin (VITAMIN B12) injection 1,000 mcg   ibuprofen  (ADVIL ) 600 MG tablet    Sig: Take 1 tablet (600 mg total) by mouth every 6 (six) hours as needed for headache or moderate pain (pain score 4-6).    Dispense:  30 tablet    Refill:  0    Supervising Provider:   DOMENICA BLACKBIRD A [[id number]]

## 2024-02-22 ENCOUNTER — Ambulatory Visit (INDEPENDENT_AMBULATORY_CARE_PROVIDER_SITE_OTHER): Admitting: Student

## 2024-02-22 ENCOUNTER — Encounter: Payer: Self-pay | Admitting: Student

## 2024-02-22 VITALS — BP 138/89 | HR 90 | Temp 98.0°F | Ht 64.0 in | Wt 213.6 lb

## 2024-02-22 DIAGNOSIS — F39 Unspecified mood [affective] disorder: Secondary | ICD-10-CM

## 2024-02-22 DIAGNOSIS — E669 Obesity, unspecified: Secondary | ICD-10-CM

## 2024-02-22 DIAGNOSIS — E538 Deficiency of other specified B group vitamins: Secondary | ICD-10-CM

## 2024-02-22 DIAGNOSIS — E559 Vitamin D deficiency, unspecified: Secondary | ICD-10-CM

## 2024-02-22 DIAGNOSIS — R748 Abnormal levels of other serum enzymes: Secondary | ICD-10-CM

## 2024-02-22 DIAGNOSIS — F341 Dysthymic disorder: Secondary | ICD-10-CM | POA: Diagnosis not present

## 2024-02-22 DIAGNOSIS — R739 Hyperglycemia, unspecified: Secondary | ICD-10-CM | POA: Diagnosis not present

## 2024-02-22 DIAGNOSIS — I10 Essential (primary) hypertension: Secondary | ICD-10-CM | POA: Diagnosis not present

## 2024-02-22 DIAGNOSIS — G43009 Migraine without aura, not intractable, without status migrainosus: Secondary | ICD-10-CM

## 2024-02-22 LAB — VITAMIN D 25 HYDROXY (VIT D DEFICIENCY, FRACTURES): VITD: 35.92 ng/mL (ref 30.00–100.00)

## 2024-02-22 LAB — ANA: Anti Nuclear Antibody (ANA): POSITIVE — AB

## 2024-02-22 LAB — ANTI-NUCLEAR AB-TITER (ANA TITER): ANA Titer 1: 1:160 {titer} — ABNORMAL HIGH

## 2024-02-22 LAB — RHEUMATOID FACTOR: Rheumatoid fact SerPl-aCnc: <10 [IU]/mL (ref <14)

## 2024-02-22 MED ORDER — CYANOCOBALAMIN 1000 MCG/ML IJ SOLN
1000.0000 ug | Freq: Once | INTRAMUSCULAR | Status: AC
  Administered 2024-02-22: 1000 ug via INTRAMUSCULAR

## 2024-02-22 MED ORDER — IBUPROFEN 600 MG PO TABS: 600.0000 mg | ORAL_TABLET | Freq: Four times a day (QID) | ORAL | 0 refills | Status: DC | PRN

## 2024-02-22 NOTE — Assessment & Plan Note (Signed)
 Well controlled, no changes to meds. Encouraged heart healthy diet such as the DASH diet and exercise as tolerated.  Monitor BP at home RTC for BP >140/90

## 2024-02-22 NOTE — Addendum Note (Signed)
 Addended by: DORINA DALLAS HERO on: 02/22/2024 09:54 PM   Modules accepted: Orders

## 2024-02-22 NOTE — Assessment & Plan Note (Addendum)
 Chronic daily headaches with possible migraine features, exacerbated by stress and anxiety. Current analgesics ineffective. Recent CT shows no acute findings. Encourage adequate hydration, at least 64 oz daily. - Prescribe ibuprofen  600 mg every six hours as needed. - Continue Tylenol  as needed with ibuprofen . - Encourage hydration and stress management. - Refer to behavioral health for anxiety and depression management.

## 2024-02-22 NOTE — Addendum Note (Signed)
 Addended by: ESTELLE GILLIS D on: 02/22/2024 10:42 AM   Modules accepted: Orders

## 2024-02-22 NOTE — Patient Instructions (Signed)

## 2024-02-22 NOTE — Addendum Note (Signed)
 Addended by: ESTELLE GILLIS D on: 02/22/2024 11:26 AM   Modules accepted: Orders

## 2024-02-22 NOTE — Assessment & Plan Note (Signed)
 Encouraged DASH or MIND diet, decrease po intake and increase exercise as tolerated. Needs 7-8 hours of sleep nightly. Avoid trans fats, eat small, frequent meals every 4-5 hours with lean proteins, complex carbs and healthy fats. Minimize simple carbs, high fat foods and processed foods

## 2024-02-25 ENCOUNTER — Other Ambulatory Visit: Payer: Self-pay | Admitting: Family Medicine

## 2024-02-27 ENCOUNTER — Ambulatory Visit: Payer: Self-pay | Admitting: Student

## 2024-02-27 DIAGNOSIS — R748 Abnormal levels of other serum enzymes: Secondary | ICD-10-CM

## 2024-02-28 NOTE — Progress Notes (Unsigned)
 Patient is here for a vitamin B12 injection per orders from Harlene Jolly, NP:  02/22/2024:  Receiving B12 injections for 1x week 1 month, 1 txper month 5 months  Last injection: 02/22/2024. Denies gastrointestinal problems or dizziness.  B12 injection to left deltoid with no apparent complications.  Scheduled next injection in one week (for 3 of 4 weekly injections): 03/07/2024.

## 2024-02-29 ENCOUNTER — Ambulatory Visit

## 2024-02-29 DIAGNOSIS — E538 Deficiency of other specified B group vitamins: Secondary | ICD-10-CM | POA: Diagnosis not present

## 2024-02-29 MED ORDER — CYANOCOBALAMIN 1000 MCG/ML IJ SOLN
1000.0000 ug | Freq: Once | INTRAMUSCULAR | Status: AC
  Administered 2024-02-29: 1000 ug via INTRAMUSCULAR

## 2024-03-01 LAB — ALKALINE PHOSPHATASE ISOENZYMES
Alkaline phosphatase (APISO): 195 U/L — ABNORMAL HIGH (ref 31–125)
Bone Isoenzymes: 47 % (ref 28–66)
Intestinal Isoenzymes: 0 % — ABNORMAL LOW (ref 1–24)
Liver Isoenzymes: 53 % (ref 25–69)
Macrohepatic isoenzymes: 0 % (ref <=0)
Placental isoenzymes: 0 % (ref <=0)

## 2024-03-01 LAB — INTRINSIC FACTOR BLOCKING ANTIBODY: Intrinsic Factor: POSITIVE — AB

## 2024-03-07 ENCOUNTER — Ambulatory Visit

## 2024-03-07 ENCOUNTER — Other Ambulatory Visit (INDEPENDENT_AMBULATORY_CARE_PROVIDER_SITE_OTHER)

## 2024-03-07 DIAGNOSIS — R748 Abnormal levels of other serum enzymes: Secondary | ICD-10-CM

## 2024-03-07 DIAGNOSIS — E538 Deficiency of other specified B group vitamins: Secondary | ICD-10-CM | POA: Diagnosis not present

## 2024-03-07 MED ORDER — CYANOCOBALAMIN 1000 MCG/ML IJ SOLN
1000.0000 ug | Freq: Once | INTRAMUSCULAR | Status: AC
  Administered 2024-03-07: 1000 ug via INTRAMUSCULAR

## 2024-03-07 NOTE — Progress Notes (Signed)
 Pt here for weekly B12 injection per original order dated: Harlene Jolly, NP  Last B12 injection:02/29/24  Last B12 level:  02/20/24  B12 1000mcg given IM, and pt tolerated injection well.  Next B12 injection scheduled for next week

## 2024-03-08 ENCOUNTER — Ambulatory Visit: Payer: Self-pay | Admitting: Student

## 2024-03-08 LAB — HEPATIC FUNCTION PANEL
ALT: 11 U/L (ref 0–35)
AST: 13 U/L (ref 0–37)
Albumin: 4.5 g/dL (ref 3.5–5.2)
Alkaline Phosphatase: 169 U/L — ABNORMAL HIGH (ref 39–117)
Bilirubin, Direct: 0.1 mg/dL (ref 0.0–0.3)
Total Bilirubin: 0.3 mg/dL (ref 0.2–1.2)
Total Protein: 7.3 g/dL (ref 6.0–8.3)

## 2024-03-08 LAB — GAMMA GT: GGT: 18 U/L (ref 7–51)

## 2024-03-12 ENCOUNTER — Ambulatory Visit

## 2024-03-14 ENCOUNTER — Ambulatory Visit (INDEPENDENT_AMBULATORY_CARE_PROVIDER_SITE_OTHER)

## 2024-03-14 DIAGNOSIS — E538 Deficiency of other specified B group vitamins: Secondary | ICD-10-CM

## 2024-03-14 MED ORDER — CYANOCOBALAMIN 1000 MCG/ML IJ SOLN
1000.0000 ug | Freq: Once | INTRAMUSCULAR | Status: AC
  Administered 2024-03-14: 1000 ug via INTRAMUSCULAR

## 2024-03-14 NOTE — Progress Notes (Signed)
 Patient is here for a vitamin B12 injection per orders from Harlene Jolly, NP:  02/22/2024:  Receiving B12 injections for 1x week 1 month, 1 txper month 5 months   Last injection: 02/29/2024.

## 2024-03-15 NOTE — Telephone Encounter (Signed)
 Referral sent to:  [facility] [address] [place], [place] [address] Phone: [phone]

## 2024-04-05 ENCOUNTER — Other Ambulatory Visit: Payer: Self-pay | Admitting: Family Medicine

## 2024-04-13 ENCOUNTER — Ambulatory Visit

## 2024-04-13 DIAGNOSIS — E538 Deficiency of other specified B group vitamins: Secondary | ICD-10-CM | POA: Diagnosis not present

## 2024-04-13 MED ORDER — CYANOCOBALAMIN 1000 MCG/ML IJ SOLN
1000.0000 ug | Freq: Once | INTRAMUSCULAR | Status: AC
  Administered 2024-04-13: 1000 ug via INTRAMUSCULAR

## 2024-04-13 NOTE — Progress Notes (Signed)
 Pt here for monthly B12 injection per original order dated: Harlene Jolly, DNP:  02/22/2024: Continue B12 injections as per current schedule.   Last B12 injection: 03/14/2024  Last B12 level: 02/20/2024   B12 1000mcg given IM, left deltoid and pt tolerated injection well.  Next B12 injection scheduled for: 05/15/2024

## 2024-04-16 NOTE — Progress Notes (Unsigned)
 Office Visit Note  Patient: Megan Hunt             Date of Birth: January 26, 1986           MRN: [id number]             PCP: Domenica Harlene LABOR, MD Referring: Dorina Dallas RIGGERS Visit Date: 04/24/2024 Occupation: Data Unavailable  Subjective:  Pain in multiple joints   History of Present Illness: Megan Hunt is a 38 y.o. female who presents today for a new patient consultation.  Patient presents today for further evaluation of pain involving multiple joints as well as a history of elevated alk phos.  Patient states that her joint pain started about 2 years ago with no identifiable trigger.  Patient states that her symptoms are most severe affecting both hands, right greater than left.  She is also at intermittent discomfort in the knee joints and ankle joints.  Patient states that she is experiencing daily pain as well as numbness and tingling affecting both hands from the elbow down.  Patient has not yet had x-rays or nerve conduction study.  Patient states that the pain in her hands has progressively been worsening over the past 6 months.  Her morning stiffness has been lasting 5 to 10 minutes daily.  She has noticed swelling in the right hand and right wrist.  She has not tried taking prednisone.  She has been taking Tylenol  on an as needed basis. Patient denies any known family history of any rheumatologic conditions.  She denies any personal or family history of uveitis, psoriasis, Crohn's disease, or ulcerative colitis.  She has not noticed any oral or nasal ulcerations, Raynaud's phenomenon, recent rashes, dry eyes, or cervical adenopathy.  She has had chronic mouth dryness.   Activities of Daily Living:  Patient reports morning stiffness for 5-10 minutes.   Patient Reports nocturnal pain.  Difficulty dressing/grooming: Denies Difficulty climbing stairs: Denies Difficulty getting out of chair: Denies Difficulty using hands for taps, buttons, cutlery, and/or writing:  Reports  Review of Systems  Constitutional:  Positive for fatigue.  HENT:  Positive for mouth dryness. Negative for mouth sores.   Eyes:  Negative for dryness.  Respiratory:  Negative for shortness of breath.   Cardiovascular:  Positive for palpitations. Negative for chest pain.  Gastrointestinal:  Positive for constipation. Negative for blood in stool and diarrhea.  Endocrine: Negative for increased urination.  Genitourinary:  Positive for involuntary urination.  Musculoskeletal:  Positive for joint pain, joint pain, joint swelling, myalgias, muscle weakness, morning stiffness, muscle tenderness and myalgias. Negative for gait problem.  Skin:  Negative for color change, rash, hair loss and sensitivity to sunlight.  Allergic/Immunologic: Negative for susceptible to infections.  Neurological:  Positive for dizziness and headaches.  Hematological:  Negative for swollen glands.  Psychiatric/Behavioral:  Positive for depressed mood and sleep disturbance. The patient is nervous/anxious.     PMFS History:  Patient Active Problem List   Diagnosis Date Noted   Obesity (BMI 35.0-39.9 without comorbidity) 02/22/2024   Low serum vitamin B12 02/21/2024   Pruritus 08/12/2020   Pain in both wrists 08/12/2020   Hyperglycemia 01/30/2020   Hypocalcemia 01/30/2020   Vitamin D  deficiency 01/30/2020   Sinusitis 07/13/2018   Preventative health care 11/27/2017   Anemia 11/27/2017   Insomnia 05/15/2016   Fatigue 08/08/2014   Hemorrhoids, external 01/05/2011   Fibromyalgia 01/05/2011   Constipation 01/03/2010   HTN (hypertension) 09/29/2009   HYPERCHOLESTEROLEMIA 10/27/2008   ANXIETY  DEPRESSION 10/27/2008   Allergic rhinitis 10/27/2008   Asthma, mild intermittent 10/27/2008   Backache 10/27/2008   CHEST PAIN, ATYPICAL 10/27/2008   Migraine 03/29/2007    Past Medical History:  Diagnosis Date   Allergic rhinitis    Anemia    Anxiety and depression    Asthma, mild intermittent 10/27/2008    Qualifier: Diagnosis of  By: Christi MD, Glendia HERO    Atypical chest pain    Back pain    Constipation    History of migraines    Hypercholesteremia    Hypertension    no longer taking meds   Insomnia 05/15/2016   Somatic dysfunction     Family History  Problem Relation Age of Onset   Hypertension Father    Stroke Father        poor speech, weakness   Hypertension Mother    Arthritis Brother    Drug abuse Daughter    Stroke Maternal Grandfather    Hypertension Maternal Grandfather    Cancer Sister    Birth defects Paternal Aunt    Past Surgical History:  Procedure Laterality Date   TUBAL LIGATION Bilateral 11/28/2019   Procedure: POST PARTUM TUBAL LIGATION;  Surgeon: Eldonna Suzen Octave, MD;  Location: [facility];  Service: Gynecology;  Laterality: Bilateral;   WISDOM TOOTH EXTRACTION     Social History   Tobacco Use   Smoking status: Never    Passive exposure: Never   Smokeless tobacco: Never  Vaping Use   Vaping status: Never Used  Substance Use Topics   Alcohol use: No   Drug use: No   Social History   Social History Narrative   Son jordan born 02/2008 and daughter vicie born 06/2009   [name] born 03/22/2013     Immunization History  Administered Date(s) Administered   DTaP 01/01/2011   Influenza Whole 06/24/2009   Influenza,inj,Quad PF,6+ Mos 03/23/2013, 03/11/2015, 03/18/2016, 02/15/2017, 04/12/2022   PFIZER Comirnaty(Gray Top)Covid-19 Tri-Sucrose Vaccine 06/24/2020   PFIZER(Purple Top)SARS-COV-2 Vaccination 06/03/2020   Tdap 03/23/2013     Objective: Vital Signs: BP (!) 138/96 (BP Location: Right Arm, Patient Position: Sitting, Cuff Size: Normal)   Pulse (!) 102   Temp 97.9 F (36.6 C)   Resp 14   Ht 5' 4.75 (1.645 m)   Wt 216 lb 6.4 oz (98.2 kg)   LMP 04/24/2024 (Exact Date)   BMI 36.29 kg/m    Physical Exam Vitals and nursing note reviewed.  Constitutional:      Appearance: She is well-developed.  HENT:     Head: Normocephalic and  atraumatic.  Eyes:     Conjunctiva/sclera: Conjunctivae normal.  Cardiovascular:     Rate and Rhythm: Normal rate and regular rhythm.     Heart sounds: Normal heart sounds.  Pulmonary:     Effort: Pulmonary effort is normal.     Breath sounds: Normal breath sounds.  Abdominal:     General: Bowel sounds are normal.     Palpations: Abdomen is soft.  Musculoskeletal:     Cervical back: Normal range of motion.  Lymphadenopathy:     Cervical: No cervical adenopathy.  Skin:    General: Skin is warm and dry.     Capillary Refill: Capillary refill takes less than 2 seconds.  Neurological:     Mental Status: She is alert and oriented to person, place, and time.  Psychiatric:        Behavior: Behavior normal.      Musculoskeletal Exam: C-spine, thoracic  spine, lumbar spine have good range of motion.  No midline spinal tenderness.  No SI joint tenderness.  Shoulder joints, elbow joints, wrist joints, MCPs, PIPs, DIPs have good range of motion with no synovitis. Discomfort making a fist with the right hand.  Complete fist formation bilaterally.  Hip joints have good range of motion with no groin pain.  Knee joints have good range of motion no warmth or effusion.  Ankle joints have good range of motion no tenderness or joint swelling.  No evidence of Achilles tendinitis or plantar fasciitis. No synovitis of MTP joints.    CDAI Exam: CDAI Score: -- Patient Global: --; Provider Global: -- Swollen: --; Tender: -- Joint Exam 04/24/2024   No joint exam has been documented for this visit   There is currently no information documented on the homunculus. Go to the Rheumatology activity and complete the homunculus joint exam.  Investigation: No additional findings.  Imaging: XR Foot 2 Views Left Result Date: 04/24/2024 CMC, PIP and DIP narrowing was noted.  No MCP, intercarpal or radiocarpal joint space narrowing was noted.  No erosive changes were noted. 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  XR Foot 2 Views Right Result Date: 04/24/2024 CMC, PIP and DIP narrowing was noted.  No MCP, intercarpal or radiocarpal joint space narrowing was noted.  No erosive changes were noted. 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  XR Hand 2 View Left Result Date: 04/24/2024 CMC, PIP and DIP narrowing was noted.  No MCP, intercarpal or radiocarpal joint space narrowing was noted.  No erosive changes were noted. 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  XR Hand 2 View Right Result Date: 04/24/2024 CMC, PIP and DIP narrowing was noted.  No MCP, intercarpal or radiocarpal joint space narrowing was noted.  No erosive changes were noted. 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   Recent Labs: Lab Results  Component Value Date   WBC 10.1 04/23/2024   HGB 12.0 04/23/2024   PLT 338.0 04/23/2024   NA 137 04/23/2024   K 2.9 (L) 04/23/2024   CL 96 04/23/2024   CO2 29 04/23/2024   GLUCOSE 125 (H) 04/23/2024   BUN 10 04/23/2024   CREATININE 0.68 04/23/2024   BILITOT 0.4 04/23/2024   ALKPHOS 150 (H) 04/23/2024   AST 13 04/23/2024   ALT 13 04/23/2024   PROT 7.1 04/23/2024   ALBUMIN 4.5 04/23/2024   CALCIUM 9.1 04/23/2024   GFRAA >60 12/06/2019    Speciality Comments: No specialty comments available.  Procedures:  No procedures performed Allergies: Abilify  [aripiprazole ]   Assessment / Plan:     Visit Diagnoses: Pain in both hands -Patient presents today for further evaluation of pain affecting both hands.  Her symptoms initially started about 2 years ago but have progressively been worsening over the past 6 months.  She has not noticed any identifiable trigger for the symptoms.  Her symptoms have been constant on a daily basis and she is also experience nocturnal pain.  She has noticed swelling in the right hand and wrist as well  as numbness and tingling affecting both hands.  She has difficulty with fine motor skills and grip due to severity of symptoms.  She has been taking Tylenol  as needed for pain relief which has been ineffective.  She has no known family history of any rheumatologic conditions.  Rheumatoid factor was negative on 02/20/2024.  ESR and CRP within normal limits on 02/20/2024. No synovitis noted today.   Plan to obtain x-rays of both hands for further evaluation.  Plan to also obtain the following lab work.  Discussed that she may require an ultrasound of both hands and/or an NCV with EMG for further evaluation.  Plan: Rheumatoid factor, Cyclic citrul peptide antibody, IgG, Sedimentation rate, C-reactive protein, Mutated Citrullinated Vimentin (MCV) Antibody  Positive ANA (antinuclear antibody) -ANA 1: 160 nuclear, dense fine speckled: Patient is been experiencing chronic pain affecting multiple joints for the past 2 years.  Her symptoms have progressively worsening over the past 6 months.  Her symptoms are most severe affecting both hands.  She is having pain and numbness in both hands during the day as well as at night.  Her ADLs have become more difficult due to severity of symptoms.  She is also noticed some increased mouth dryness but no eye dryness.  No oral or nasal ulcerations.  No symptoms of Raynaud's phenomenon.  No recent rashes.  No cervical lymphadenopathy. She has no other clinical features of systemic lupus.  No known family history of systemic lupus.  Plan to obtain the following lab work today for further evaluation.  Plan: Serum protein electrophoresis with reflex, Anti-scleroderma antibody, RNP Antibody, Anti-Smith antibody, Sjogrens syndrome-A extractable nuclear antibody, Sjogrens syndrome-B extractable nuclear antibody, Anti-DNA antibody, double-stranded, C3 and C4, Sedimentation rate, C-reactive protein, Mutated Citrullinated Vimentin (MCV) Antibody, Mitochondrial antibodies, Anti-smooth  muscle antibody, IgG, ANA, Urinalysis, Routine w reflex microscopic, XR Hand 2 View Right, XR Hand 2 View Left  Elevated alkaline phosphatase level -Alk phos has been chronically elevated--132 on 12/06/19.  Alk phos was within normal limits on 02/06/2024.  Isoenzymes within normal limits on 02/06/24.  Alk phos was rechecked on 02/22/2024 and was 195, intestinal isoenzymes 0, bone isoenzymes WNL, and liver isoenzymes WNL.  AST and ALT have remained within normal limits.  Total bilirubin has remained within normal limits. Alk phos rechecked yesterday: 150-has been trending back down since October.  Plan to obtain the following lab work today for further evaluation.  Could consider a total body bone scan if alk phos remains elevated or is trending up.  Can consider referral to endocrinology or gastroenterology pending results or if the alk phos remains persistently elevated.  Plan: Serum protein electrophoresis with reflex, Mitochondrial antibodies, Anti-smooth muscle antibody, IgG  Numbness in both hands -Patient presents today for further evaluation of pain and numbness affecting both hands.  Her symptoms have been occurring on a daily basis and have started to interfere with her quality of life.  She has also been experiencing nocturnal pain.  Plan to obtain the following lab work and x-rays today.  Can consider an ultrasound and/or proceeding with NCV or EMG for further evaluation if symptoms persist.  She has tried wearing carpal tunnel splints at night but her symptoms were exacerbated.  Plan: C-reactive protein, Folate  Pain in both feet - She has intermittent discomfort in both feet.  She has good ROM of  both ankle joints with no tenderness or joint swelling.  No synovitis of MTP joints.  No evidence of Achilles tendinitis or plantar fasciitis currently.   Plan to obtain x-rays of both feet for further evaluation.  Plan: XR Foot 2 Views Left, XR Foot 2 Views Right  Myalgia -She experiences intermittent  myalgias and has had chronic arthralgias.  History of fibromyalgia is listed on the problem list but the patient is unsure if the diagnosis is  accurate.   CK WNL on 02/20/24. Plan to recheck CK today along with the following lab work for further evaluation.   Plan: CK  Polyarthralgia - 02/20/24: ANA 1:160 nuclear, dense fine speckled, ESR 20, CK 106, CRP 0.8, RF<10: No obvious synovitis noted today.  She has been experiencing chronic pain involving multiple joints.  Her symptoms are most severe affecting both hands but she is also intermittent discomfort in both knees and both feet.  She has noticed swelling in the right hand and right wrist as well as increased difficulty performing ADLs.  X-rays were consistent with early osteoarthritis.  No erosive changes were noted. Plan to obtain the following lab work today for further evaluation.  May consider an ultrasound to assess for active inflammation pending lab results.   - Plan: Serum protein electrophoresis with reflex, Anti-scleroderma antibody, RNP Antibody, Anti-Smith antibody, Sjogrens syndrome-A extractable nuclear antibody, Sjogrens syndrome-B extractable nuclear antibody, Anti-DNA antibody, double-stranded, C3 and C4, Rheumatoid factor, Cyclic citrul peptide antibody, IgG, Sedimentation rate, C-reactive protein, Mutated Citrullinated Vimentin (MCV) Antibody, Mitochondrial antibodies, Anti-smooth muscle antibody, IgG  Other fatigue - Chronic--plan to check the following lab work today.  Plan: Serum protein electrophoresis with reflex, Anti-scleroderma antibody, RNP Antibody, Anti-Smith antibody, Sjogrens syndrome-A extractable nuclear antibody, Sjogrens syndrome-B extractable nuclear antibody, Anti-DNA antibody, double-stranded, C3 and C4, Rheumatoid factor, Cyclic citrul peptide antibody, IgG, Sedimentation rate, C-reactive protein, Mutated Citrullinated Vimentin (MCV) Antibody, Mitochondrial antibodies, Anti-smooth muscle antibody, IgG, CK  Other  medical condition are listed as follows:   Primary hypertension: Blood pressure was elevated today in the office and was rechecked prior to leaving.  Patient was advised to monitor blood pressure closely and return to PCP if blood pressure remains elevated.  Mild intermittent asthma without complication - Childhood  Non-seasonal allergic rhinitis due to pollen  Hx of migraines  Vitamin D  deficiency  Low serum vitamin B12: Vitamin B12 was 607 on 04/19/2024.  Anxiety and depression      Orders: Orders Placed This Encounter  Procedures   XR Hand 2 View Right   XR Hand 2 View Left   XR Foot 2 Views Left   XR Foot 2 Views Right   Serum protein electrophoresis with reflex   Anti-scleroderma antibody   RNP Antibody   Anti-Smith antibody   Sjogrens syndrome-A extractable nuclear antibody   Sjogrens syndrome-B extractable nuclear antibody   Anti-DNA antibody, double-stranded   C3 and C4   Rheumatoid factor   Cyclic citrul peptide antibody, IgG   Sedimentation rate   C-reactive protein   Mutated Citrullinated Vimentin (MCV) Antibody   Mitochondrial antibodies   Anti-smooth muscle antibody, IgG   CK   Folate   ANA   Urinalysis, Routine w reflex microscopic   No orders of the defined types were placed in this encounter.    Follow-Up Instructions: Return for NPFU.   Megan CHRISTELLA Craze, PA-C  Note - This record has been created using Dragon software.  Chart creation errors have been sought, but may not always  have been located. Such creation errors do not reflect on  the standard of medical care.

## 2024-04-18 NOTE — Assessment & Plan Note (Signed)
Monitor and report any concerns, no changes to meds. Encouraged heart healthy diet such as the DASH diet and exercise as tolerated.  Monitor at home and report any concerns.

## 2024-04-18 NOTE — Assessment & Plan Note (Signed)
 [id number] acceptable, minimize simple carbs. Increase exercise as tolerated.

## 2024-04-18 NOTE — Progress Notes (Signed)
 Subjective:    Patient ID: Megan Hunt, female    DOB: Nov 26, 1985, 38 y.o.   MRN: [id number]  No chief complaint on file.   HPI Discussed the use of AI scribe software for clinical note transcription with the patient, who gave verbal consent to proceed.  History of Present Illness Megan Hunt is a 38 year old female with prediabetes and hypertension who presents for follow-up on hormone levels, blood pressure, and prediabetes management.  Her hormone levels were checked in September. She is currently taking amlodipine  10 mg and chlorthalidone  25 mg for blood pressure management. She has not been checking her blood pressure at home but continues her medication regimen.  Her A1c was previously 6.4, indicating prediabetes. She is concerned about her diet and exercise, stating she has been doing 'terrible' in these areas.  She has a positive intrinsic factor and has been receiving vitamin B12 shots weekly for four weeks and is now on a monthly regimen. She has been receiving vitamin B12 shots weekly for four weeks and is now on a monthly regimen. She also takes vitamin D  weekly and 2000 IU daily.  She experiences significant aches and pains, particularly in her hands and right arm, which is swollen and affects her daily activities such as brushing her teeth and picking up objects. She is right-handed.  No changes in her stress management and she does not express interest in receiving a flu shot at this time.    Past Medical History:  Diagnosis Date   Allergic rhinitis    Anemia    Anxiety and depression    Asthma, mild intermittent 10/27/2008   Qualifier: Diagnosis of  By: Christi MD, Glendia CHRISTELLA    Atypical chest pain    Back pain    Constipation    History of migraines    Hypercholesteremia    Hypertension    no longer taking meds   Insomnia 05/15/2016   Somatic dysfunction     Past Surgical History:  Procedure Laterality Date   TUBAL LIGATION Bilateral 11/28/2019    Procedure: POST PARTUM TUBAL LIGATION;  Surgeon: Eldonna Suzen Octave, MD;  Location: [facility];  Service: Gynecology;  Laterality: Bilateral;   WISDOM TOOTH EXTRACTION      Family History  Problem Relation Age of Onset   Hypertension Father    Stroke Father        poor speech, weakness   Hypertension Mother    Arthritis Brother    Drug abuse Daughter    Stroke Maternal Grandfather    Hypertension Maternal Grandfather    Cancer Sister    Birth defects Paternal Aunt     Social History   Socioeconomic History   Marital status: Married    Spouse name: Not on file   Number of children: 2   Years of education: Not on file   Highest education level: Not on file  Occupational History   Occupation: [employer]    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    Comment: grad [employer] degree in social work  Tobacco Use   Smoking status: Never   Smokeless tobacco: Never  Vaping Use   Vaping status: Never Used  Substance and Sexual Activity   Alcohol use: No   Drug use: No   Sexual activity: Not Currently    Birth control/protection: Surgical    Comment: lives with husband and 3 small children, no dietary restrictions, stays at home  Other Topics Concern   Not on  file  Social History Narrative   Son jordan born 02/2008 and daughter vicie born 06/2009   [name] born 03/22/2013   Social Drivers of Health   Tobacco Use: Low Risk (02/22/2024)   Patient History    Smoking Tobacco Use: Never    Smokeless Tobacco Use: Never    Passive Exposure: Not on file  Financial Resource Strain: Not on file  Food Insecurity: Not on file  Transportation Needs: Not on file  Physical Activity: Not on file  Stress: Not on file  Social Connections: Not on file  Intimate Partner Violence: Not on file  Depression (PHQ2-9): High Risk (04/19/2024)   Depression (PHQ2-9)    PHQ-2 Score: 14  Alcohol Screen: Not on file  Housing: Not on file  Utilities: Not on file  Health Literacy: Not on file     Outpatient Medications Prior to Visit  Medication Sig Dispense Refill   ALPRAZolam  (XANAX ) 0.25 MG tablet TAKE 1-2 TABLETS (0.25-0.5 MG TOTAL) BY MOUTH 2 (TWO) TIMES DAILY AS NEEDED FOR ANXIETY. 60 tablet 1   amLODipine  (NORVASC ) 10 MG tablet Take 1 tablet (10 mg total) by mouth daily. 90 tablet 0   cetirizine  (ZYRTEC ) 10 MG tablet Take 1 tablet (10 mg total) by mouth daily. 90 tablet 1   chlorthalidone  (HYGROTON ) 25 MG tablet Take 1 tablet (25 mg total) by mouth daily. 30 tablet 11   cyclobenzaprine  (FLEXERIL ) 5 MG tablet 1 tab po q hs prn trapezius pain 3 tablet 0   ibuprofen  (ADVIL ) 600 MG tablet Take 1 tablet (600 mg total) by mouth every 6 (six) hours as needed for headache or moderate pain (pain score 4-6). 30 tablet 0   ondansetron  (ZOFRAN ) 4 MG tablet Take 1 tablet (4 mg total) by mouth every 8 (eight) hours as needed for nausea or vomiting. 20 tablet 0   venlafaxine  XR (EFFEXOR -XR) 150 MG 24 hr capsule TAKE 225 MG BY MOUTH DAILY, TAKE 150 MG TAB WITH 75 MG TAB DAILY 30 capsule 3   venlafaxine  XR (EFFEXOR -XR) 75 MG 24 hr capsule TAKE 1 CAPSULE WITH 150MG  DAILY 30 capsule 3   Vitamin D , Ergocalciferol , (DRISDOL ) 1.25 MG (50000 UNIT) CAPS capsule Take 1 capsule (50,000 Units total) by mouth every 7 (seven) days. 4 capsule 4   No facility-administered medications prior to visit.    Allergies  Allergen Reactions   Abilify  [Aripiprazole ] Other (See Comments)    nightmares    Review of Systems  Constitutional:  Negative for fever and malaise/fatigue.  HENT:  Negative for congestion.   Eyes:  Negative for blurred vision.  Respiratory:  Negative for shortness of breath.   Cardiovascular:  Negative for chest pain, palpitations and leg swelling.  Gastrointestinal:  Negative for abdominal pain, blood in stool and nausea.  Genitourinary:  Negative for dysuria and frequency.  Musculoskeletal:  Positive for joint pain. Negative for falls.  Skin:  Negative for rash.  Neurological:   Negative for dizziness, loss of consciousness and headaches.  Endo/Heme/Allergies:  Negative for environmental allergies.  Psychiatric/Behavioral:  Negative for depression. The patient is nervous/anxious.        Objective:    Physical Exam Constitutional:      General: She is not in acute distress.    Appearance: Normal appearance. She is well-developed. She is not toxic-appearing.  HENT:     Head: Normocephalic and atraumatic.     Right Ear: External ear normal.     Left Ear: External ear normal.  Nose: Nose normal.  Eyes:     General:        Right eye: No discharge.        Left eye: No discharge.     Conjunctiva/sclera: Conjunctivae normal.  Neck:     Thyroid : No thyromegaly.  Cardiovascular:     Rate and Rhythm: Normal rate and regular rhythm.     Heart sounds: Normal heart sounds. No murmur heard. Pulmonary:     Effort: Pulmonary effort is normal. No respiratory distress.     Breath sounds: Normal breath sounds.  Abdominal:     General: Bowel sounds are normal.     Palpations: Abdomen is soft.     Tenderness: There is no abdominal tenderness. There is no guarding.  Musculoskeletal:        General: Normal range of motion.     Cervical back: Neck supple.  Lymphadenopathy:     Cervical: No cervical adenopathy.  Skin:    General: Skin is warm and dry.  Neurological:     Mental Status: She is alert and oriented to person, place, and time.  Psychiatric:        Mood and Affect: Mood normal.        Behavior: Behavior normal.        Thought Content: Thought content normal.        Judgment: Judgment normal.     BP (!) 128/92 (BP Location: Right Arm, Patient Position: Sitting, Cuff Size: Normal)   Pulse 91   Temp 98.4 F (36.9 C) (Oral)   Ht 5' 4 (1.626 m)   Wt 218 lb (98.9 kg)   LMP 03/16/2024 (Exact Date)   SpO2 98%   BMI 37.42 kg/m  Wt Readings from Last 3 Encounters:  04/19/24 218 lb (98.9 kg)  02/22/24 213 lb 9.6 oz (96.9 kg)  02/20/24 214 lb 6.4 oz  (97.3 kg)    Diabetic Foot Exam - Simple   No data filed    Lab Results  Component Value Date   WBC 10.1 04/19/2024   HGB 12.4 04/19/2024   HCT 37.9 04/19/2024   PLT 339.0 04/19/2024   GLUCOSE 92 04/19/2024   CHOL 217 (H) 04/19/2024   TRIG 78.0 04/19/2024   HDL 57.90 04/19/2024   LDLDIRECT 177.4 06/15/2013   LDLCALC 143 (H) 04/19/2024   ALT 15 04/19/2024   AST 15 04/19/2024   NA 136 04/19/2024   K 3.3 (L) 04/19/2024   CL 98 04/19/2024   CREATININE 0.66 04/19/2024   BUN 6 04/19/2024   CO2 30 04/19/2024   TSH 0.54 04/19/2024   HGBA1C 6.1 04/19/2024    Lab Results  Component Value Date   TSH 0.54 04/19/2024   Lab Results  Component Value Date   WBC 10.1 04/19/2024   HGB 12.4 04/19/2024   HCT 37.9 04/19/2024   MCV 82.6 04/19/2024   PLT 339.0 04/19/2024   Lab Results  Component Value Date   NA 136 04/19/2024   K 3.3 (L) 04/19/2024   CO2 30 04/19/2024   GLUCOSE 92 04/19/2024   BUN 6 04/19/2024   CREATININE 0.66 04/19/2024   BILITOT 0.4 04/19/2024   ALKPHOS 152 (H) 04/19/2024   AST 15 04/19/2024   ALT 15 04/19/2024   PROT 7.2 04/19/2024   ALBUMIN 4.5 04/19/2024   CALCIUM 9.2 04/19/2024   ANIONGAP 11 12/06/2019   GFR 110.79 04/19/2024   Lab Results  Component Value Date   CHOL 217 (H) 04/19/2024   Lab Results  Component Value Date   HDL 57.90 04/19/2024   Lab Results  Component Value Date   LDLCALC 143 (H) 04/19/2024   Lab Results  Component Value Date   TRIG 78.0 04/19/2024   Lab Results  Component Value Date   CHOLHDL 4 04/19/2024   Lab Results  Component Value Date   HGBA1C 6.1 04/19/2024       Assessment & Plan:  Primary hypertension Assessment & Plan: Monitor and report any concerns, no changes to meds. Encouraged heart healthy diet such as the DASH diet and exercise as tolerated.  Monitor at home and report any concerns.   Orders: -     Comprehensive metabolic panel with GFR -     CBC with  Differential/Platelet  HYPERCHOLESTEROLEMIA Assessment & Plan: Encourage heart healthy diet such as MIND or DASH diet, increase exercise, avoid trans fats, simple carbohydrates and processed foods, consider a krill or fish or flaxseed oil cap daily.    Orders: -     Lipid panel  Hyperglycemia Assessment & Plan: hgba1c acceptable, minimize simple carbs. Increase exercise as tolerated.   Orders: -     TSH -     Hemoglobin A1c  Vitamin D  deficiency -     VITAMIN D  25 Hydroxy (Vit-D Deficiency, Fractures)  Pernicious anemia -     Vitamin B12  Low serum vitamin B12 Assessment & Plan: Supplement and monitor    Pain in both wrists Assessment & Plan: And in right had. Positive ANA. Seeing rheumatology soon     Assessment and Plan Assessment & Plan Primary hypertension Blood pressure remains elevated despite current regimen of amlodipine  and chlorthalidone . Diastolic pressure is at 90 mmHg, indicating suboptimal control. Discussed the multifactorial nature of hypertension and the potential need for additional medication. Consideration of valsartan for better control and kidney protection, especially if diabetes is confirmed. - Ordered blood work to assess current status - Will consider starting valsartan if blood work is unremarkable - Advised on lifestyle modifications: reduce salt intake, ensure adequate sleep, increase physical activity, and maintain hydration - Recommended obtaining a home blood pressure cuff for monitoring  Prediabetes Previous A1c was 6.4, indicating prediabetes. Discussed the potential for GLP-1 agonists to aid in weight loss and blood pressure control, contingent on insurance coverage. Insurance may require a trial of metformin before GLP-1s. Explained that if A1c increases to 6.5, GLP-1s may be covered, but insurance may require a trial of metformin first. 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 Currently receiving monthly vitamin B12 injections. Discussed the potential for transitioning to sublingual tablets in the future, but insurance does not cover this option. Emphasized the importance of maintaining B12 levels to prevent symptoms and support energy levels. - Continue monthly vitamin B12 injections - Will recheck vitamin B12 levels to ensure adequacy  Vitamin D  deficiency Currently receiving weekly vitamin D  injections and taking 2000 IU daily. Plan to recheck levels to ensure adequacy. - Continue weekly vitamin D  injections - Continue daily vitamin D  supplementation - Will recheck vitamin D  levels  General Health Maintenance Discussed the importance of flu vaccination, especially given the anticipated high rates of infection this season. She declined the flu shot today but  was informed of available options at local pharmacies. - Encouraged flu vaccination at local pharmacies if she changes her mind  Recording duration: 17 minutes     Harlene Horton, MD

## 2024-04-18 NOTE — Assessment & Plan Note (Signed)
 Encourage heart healthy diet such as MIND or DASH diet, increase exercise, avoid trans fats, simple carbohydrates and processed foods, consider a krill or fish or flaxseed oil cap daily.

## 2024-04-19 ENCOUNTER — Ambulatory Visit: Admitting: Family Medicine

## 2024-04-19 VITALS — BP 128/92 | HR 91 | Temp 98.4°F | Ht 64.0 in | Wt 218.0 lb

## 2024-04-19 DIAGNOSIS — M25531 Pain in right wrist: Secondary | ICD-10-CM

## 2024-04-19 DIAGNOSIS — E7800 Pure hypercholesterolemia, unspecified: Secondary | ICD-10-CM

## 2024-04-19 DIAGNOSIS — R739 Hyperglycemia, unspecified: Secondary | ICD-10-CM

## 2024-04-19 DIAGNOSIS — E559 Vitamin D deficiency, unspecified: Secondary | ICD-10-CM

## 2024-04-19 DIAGNOSIS — E538 Deficiency of other specified B group vitamins: Secondary | ICD-10-CM

## 2024-04-19 DIAGNOSIS — D510 Vitamin B12 deficiency anemia due to intrinsic factor deficiency: Secondary | ICD-10-CM

## 2024-04-19 DIAGNOSIS — I10 Essential (primary) hypertension: Secondary | ICD-10-CM

## 2024-04-19 LAB — CBC WITH DIFFERENTIAL/PLATELET
Basophils Absolute: 0.1 K/uL (ref 0.0–0.1)
Basophils Relative: 0.7 % (ref 0.0–3.0)
Eosinophils Absolute: 0.1 K/uL (ref 0.0–0.7)
Eosinophils Relative: 1.3 % (ref 0.0–5.0)
HCT: 37.9 % (ref 36.0–46.0)
Hemoglobin: 12.4 g/dL (ref 12.0–15.0)
Lymphocytes Relative: 26.2 % (ref 12.0–46.0)
Lymphs Abs: 2.6 K/uL (ref 0.7–4.0)
MCHC: 32.6 g/dL (ref 30.0–36.0)
MCV: 82.6 fl (ref 78.0–100.0)
Monocytes Absolute: 0.8 K/uL (ref 0.1–1.0)
Monocytes Relative: 8.1 % (ref 3.0–12.0)
Neutro Abs: 6.4 K/uL (ref 1.4–7.7)
Neutrophils Relative %: 63.7 % (ref 43.0–77.0)
Platelets: 339 K/uL (ref 150.0–400.0)
RBC: 4.59 Mil/uL (ref 3.87–5.11)
RDW: 13.9 % (ref 11.5–15.5)
WBC: 10.1 K/uL (ref 4.0–10.5)

## 2024-04-19 LAB — LIPID PANEL
Cholesterol: 217 mg/dL — ABNORMAL HIGH (ref 0–200)
HDL: 57.9 mg/dL (ref >39.00)
LDL Cholesterol: 143 mg/dL — ABNORMAL HIGH (ref 0–99)
NonHDL: 158.68
Total CHOL/HDL Ratio: 4
Triglycerides: 78 mg/dL (ref 0.0–149.0)
VLDL: 15.6 mg/dL (ref 0.0–40.0)

## 2024-04-19 LAB — COMPREHENSIVE METABOLIC PANEL WITH GFR
ALT: 15 U/L (ref 0–35)
AST: 15 U/L (ref 0–37)
Albumin: 4.5 g/dL (ref 3.5–5.2)
Alkaline Phosphatase: 152 U/L — ABNORMAL HIGH (ref 39–117)
BUN: 6 mg/dL (ref 6–23)
CO2: 30 meq/L (ref 19–32)
Calcium: 9.2 mg/dL (ref 8.4–10.5)
Chloride: 98 meq/L (ref 96–112)
Creatinine, Ser: 0.66 mg/dL (ref 0.40–1.20)
GFR: 110.79 mL/min (ref >60.00)
Glucose, Bld: 92 mg/dL (ref 70–99)
Potassium: 3.3 meq/L — ABNORMAL LOW (ref 3.5–5.1)
Sodium: 136 meq/L (ref 135–145)
Total Bilirubin: 0.4 mg/dL (ref 0.2–1.2)
Total Protein: 7.2 g/dL (ref 6.0–8.3)

## 2024-04-19 LAB — TSH: TSH: 0.54 u[IU]/mL (ref 0.35–5.50)

## 2024-04-19 LAB — VITAMIN B12: Vitamin B-12: 607 pg/mL (ref 211–911)

## 2024-04-19 LAB — HEMOGLOBIN A1C: Hgb A1c MFr Bld: 6.1 % (ref 4.6–6.5)

## 2024-04-19 LAB — VITAMIN D 25 HYDROXY (VIT D DEFICIENCY, FRACTURES): VITD: 40.37 ng/mL (ref 30.00–100.00)

## 2024-04-19 NOTE — Patient Instructions (Signed)
 Hypertension, Adult High blood pressure (hypertension) is when the force of blood pumping through the arteries is too strong. The arteries are the blood vessels that carry blood from the heart throughout the body. Hypertension forces the heart to work harder to pump blood and may cause arteries to become narrow or stiff. Untreated or uncontrolled hypertension can lead to a heart attack, heart failure, a stroke, kidney disease, and other problems. A blood pressure reading consists of a higher number over a lower number. Ideally, your blood pressure should be below 120/80. The first ("top") number is called the systolic pressure. It is a measure of the pressure in your arteries as your heart beats. The second ("bottom") number is called the diastolic pressure. It is a measure of the pressure in your arteries as the heart relaxes. What are the causes? The exact cause of this condition is not known. There are some conditions that result in high blood pressure. What increases the risk? Certain factors may make you more likely to develop high blood pressure. Some of these risk factors are under your control, including: Smoking. Not getting enough exercise or physical activity. Being overweight. Having too much fat, sugar, calories, or salt (sodium) in your diet. Drinking too much alcohol. Other risk factors include: Having a personal history of heart disease, diabetes, high cholesterol, or kidney disease. Stress. Having a family history of high blood pressure and high cholesterol. Having obstructive sleep apnea. Age. The risk increases with age. What are the signs or symptoms? High blood pressure may not cause symptoms. Very high blood pressure (hypertensive crisis) may cause: Headache. Fast or irregular heartbeats (palpitations). Shortness of breath. Nosebleed. Nausea and vomiting. Vision changes. Severe chest pain, dizziness, and seizures. How is this diagnosed? This condition is diagnosed by  measuring your blood pressure while you are seated, with your arm resting on a flat surface, your legs uncrossed, and your feet flat on the floor. The cuff of the blood pressure monitor will be placed directly against the skin of your upper arm at the level of your heart. Blood pressure should be measured at least twice using the same arm. Certain conditions can cause a difference in blood pressure between your right and left arms. If you have a high blood pressure reading during one visit or you have normal blood pressure with other risk factors, you may be asked to: Return on a different day to have your blood pressure checked again. Monitor your blood pressure at home for 1 week or longer. If you are diagnosed with hypertension, you may have other blood or imaging tests to help your health care provider understand your overall risk for other conditions. How is this treated? This condition is treated by making healthy lifestyle changes, such as eating healthy foods, exercising more, and reducing your alcohol intake. You may be referred for counseling on a healthy diet and physical activity. Your health care provider may prescribe medicine if lifestyle changes are not enough to get your blood pressure under control and if: Your systolic blood pressure is above 130. Your diastolic blood pressure is above 80. Your personal target blood pressure may vary depending on your medical conditions, your age, and other factors. Follow these instructions at home: Eating and drinking  Eat a diet that is high in fiber and potassium, and low in sodium, added sugar, and fat. An example of this eating plan is called the DASH diet. DASH stands for Dietary Approaches to Stop Hypertension. To eat this way: Eat  plenty of fresh fruits and vegetables. Try to fill one half of your plate at each meal with fruits and vegetables. Eat whole grains, such as whole-wheat pasta, brown rice, or whole-grain bread. Fill about one  fourth of your plate with whole grains. Eat or drink low-fat dairy products, such as skim milk or low-fat yogurt. Avoid fatty cuts of meat, processed or cured meats, and poultry with skin. Fill about one fourth of your plate with lean proteins, such as fish, chicken without skin, beans, eggs, or tofu. Avoid pre-made and processed foods. These tend to be higher in sodium, added sugar, and fat. Reduce your daily sodium intake. Many people with hypertension should eat less than 1,500 mg of sodium a day. Do not drink alcohol if: Your health care provider tells you not to drink. You are pregnant, may be pregnant, or are planning to become pregnant. If you drink alcohol: Limit how much you have to: 0-1 drink a day for women. 0-2 drinks a day for men. Know how much alcohol is in your drink. In the [place] one drink equals one 12 oz bottle of beer (355 mL), one 5 oz glass of wine (148 mL), or one 1 oz glass of hard liquor (44 mL). Lifestyle  Work with your health care provider to maintain a healthy body weight or to lose weight. Ask what an ideal weight is for you. Get at least 30 minutes of exercise that causes your heart to beat faster (aerobic exercise) most days of the week. Activities may include walking, swimming, or biking. Include exercise to strengthen your muscles (resistance exercise), such as Pilates or lifting weights, as part of your weekly exercise routine. Try to do these types of exercises for 30 minutes at least 3 days a week. Do not use any products that contain nicotine or tobacco. These products include cigarettes, chewing tobacco, and vaping devices, such as e-cigarettes. If you need help quitting, ask your health care provider. Monitor your blood pressure at home as told by your health care provider. Keep all follow-up visits. This is important. Medicines Take over-the-counter and prescription medicines only as told by your health care provider. Follow directions carefully. Blood  pressure medicines must be taken as prescribed. Do not skip doses of blood pressure medicine. Doing this puts you at risk for problems and can make the medicine less effective. Ask your health care provider about side effects or reactions to medicines that you should watch for. Contact a health care provider if you: Think you are having a reaction to a medicine you are taking. Have headaches that keep coming back (recurring). Feel dizzy. Have swelling in your ankles. Have trouble with your vision. Get help right away if you: Develop a severe headache or confusion. Have unusual weakness or numbness. Feel faint. Have severe pain in your chest or abdomen. Vomit repeatedly. Have trouble breathing. These symptoms may be an emergency. Get help right away. Call 911. Do not wait to see if the symptoms will go away. Do not drive yourself to the hospital. Summary Hypertension is when the force of blood pumping through your arteries is too strong. If this condition is not controlled, it may put you at risk for serious complications. Your personal target blood pressure may vary depending on your medical conditions, your age, and other factors. For most people, a normal blood pressure is less than 120/80. Hypertension is treated with lifestyle changes, medicines, or a combination of both. Lifestyle changes include losing weight, eating a healthy,  low-sodium diet, exercising more, and limiting alcohol. This information is not intended to replace advice given to you by your health care provider. Make sure you discuss any questions you have with your health care provider. Document Revised: 03/03/2021 Document Reviewed: 03/03/2021 Elsevier Patient Education  2024 [employer].

## 2024-04-20 ENCOUNTER — Ambulatory Visit: Payer: Self-pay | Admitting: Family Medicine

## 2024-04-20 DIAGNOSIS — E876 Hypokalemia: Secondary | ICD-10-CM

## 2024-04-20 MED ORDER — POTASSIUM CHLORIDE CRYS ER 10 MEQ PO TBCR: 10.0000 meq | EXTENDED_RELEASE_TABLET | Freq: Every day | ORAL | 3 refills | Status: AC

## 2024-04-22 ENCOUNTER — Encounter: Payer: Self-pay | Admitting: Family Medicine

## 2024-04-22 NOTE — Assessment & Plan Note (Signed)
 Supplement and monitor

## 2024-04-22 NOTE — Assessment & Plan Note (Signed)
 And in right had. Positive ANA. Seeing rheumatology soon

## 2024-04-23 ENCOUNTER — Other Ambulatory Visit

## 2024-04-23 DIAGNOSIS — E7800 Pure hypercholesterolemia, unspecified: Secondary | ICD-10-CM

## 2024-04-23 DIAGNOSIS — E876 Hypokalemia: Secondary | ICD-10-CM | POA: Diagnosis not present

## 2024-04-23 DIAGNOSIS — R739 Hyperglycemia, unspecified: Secondary | ICD-10-CM | POA: Diagnosis not present

## 2024-04-23 DIAGNOSIS — I10 Essential (primary) hypertension: Secondary | ICD-10-CM

## 2024-04-23 LAB — LIPID PANEL
Cholesterol: 206 mg/dL — ABNORMAL HIGH (ref 0–200)
HDL: 59.5 mg/dL (ref >39.00)
LDL Cholesterol: 124 mg/dL — ABNORMAL HIGH (ref 0–99)
NonHDL: 146.55
Total CHOL/HDL Ratio: 3
Triglycerides: 112 mg/dL (ref 0.0–149.0)
VLDL: 22.4 mg/dL (ref 0.0–40.0)

## 2024-04-23 LAB — CBC WITH DIFFERENTIAL/PLATELET
Basophils Absolute: 0.1 K/uL (ref 0.0–0.1)
Basophils Relative: 0.6 % (ref 0.0–3.0)
Eosinophils Absolute: 0.3 K/uL (ref 0.0–0.7)
Eosinophils Relative: 3.2 % (ref 0.0–5.0)
HCT: 36.9 % (ref 36.0–46.0)
Hemoglobin: 12 g/dL (ref 12.0–15.0)
Lymphocytes Relative: 23.6 % (ref 12.0–46.0)
Lymphs Abs: 2.4 K/uL (ref 0.7–4.0)
MCHC: 32.6 g/dL (ref 30.0–36.0)
MCV: 82.8 fl (ref 78.0–100.0)
Monocytes Absolute: 0.8 K/uL (ref 0.1–1.0)
Monocytes Relative: 8 % (ref 3.0–12.0)
Neutro Abs: 6.5 K/uL (ref 1.4–7.7)
Neutrophils Relative %: 64.6 % (ref 43.0–77.0)
Platelets: 338 K/uL (ref 150.0–400.0)
RBC: 4.45 Mil/uL (ref 3.87–5.11)
RDW: 14.2 % (ref 11.5–15.5)
WBC: 10.1 K/uL (ref 4.0–10.5)

## 2024-04-23 LAB — COMPREHENSIVE METABOLIC PANEL WITH GFR
ALT: 13 U/L (ref 0–35)
AST: 13 U/L (ref 0–37)
Albumin: 4.5 g/dL (ref 3.5–5.2)
Alkaline Phosphatase: 150 U/L — ABNORMAL HIGH (ref 39–117)
BUN: 10 mg/dL (ref 6–23)
CO2: 29 meq/L (ref 19–32)
Calcium: 9.1 mg/dL (ref 8.4–10.5)
Chloride: 96 meq/L (ref 96–112)
Creatinine, Ser: 0.68 mg/dL (ref 0.40–1.20)
GFR: 109.99 mL/min (ref >60.00)
Glucose, Bld: 125 mg/dL — ABNORMAL HIGH (ref 70–99)
Potassium: 2.9 meq/L — ABNORMAL LOW (ref 3.5–5.1)
Sodium: 137 meq/L (ref 135–145)
Total Bilirubin: 0.4 mg/dL (ref 0.2–1.2)
Total Protein: 7.1 g/dL (ref 6.0–8.3)

## 2024-04-23 LAB — HEMOGLOBIN A1C: Hgb A1c MFr Bld: 6.2 % (ref 4.6–6.5)

## 2024-04-24 ENCOUNTER — Ambulatory Visit

## 2024-04-24 ENCOUNTER — Ambulatory Visit: Attending: Physician Assistant | Admitting: Physician Assistant

## 2024-04-24 ENCOUNTER — Encounter: Payer: Self-pay | Admitting: Physician Assistant

## 2024-04-24 VITALS — BP 138/96 | HR 102 | Temp 97.9°F | Resp 14 | Ht 64.75 in | Wt 216.4 lb

## 2024-04-24 DIAGNOSIS — R200 Anesthesia of skin: Secondary | ICD-10-CM

## 2024-04-24 DIAGNOSIS — M797 Fibromyalgia: Secondary | ICD-10-CM

## 2024-04-24 DIAGNOSIS — M2550 Pain in unspecified joint: Secondary | ICD-10-CM

## 2024-04-24 DIAGNOSIS — R748 Abnormal levels of other serum enzymes: Secondary | ICD-10-CM

## 2024-04-24 DIAGNOSIS — R7689 Other specified abnormal immunological findings in serum: Secondary | ICD-10-CM

## 2024-04-24 DIAGNOSIS — M791 Myalgia, unspecified site: Secondary | ICD-10-CM

## 2024-04-24 DIAGNOSIS — J301 Allergic rhinitis due to pollen: Secondary | ICD-10-CM

## 2024-04-24 DIAGNOSIS — E559 Vitamin D deficiency, unspecified: Secondary | ICD-10-CM

## 2024-04-24 DIAGNOSIS — F419 Anxiety disorder, unspecified: Secondary | ICD-10-CM

## 2024-04-24 DIAGNOSIS — R5383 Other fatigue: Secondary | ICD-10-CM

## 2024-04-24 DIAGNOSIS — J4520 Mild intermittent asthma, uncomplicated: Secondary | ICD-10-CM

## 2024-04-24 DIAGNOSIS — Z8669 Personal history of other diseases of the nervous system and sense organs: Secondary | ICD-10-CM

## 2024-04-24 DIAGNOSIS — I10 Essential (primary) hypertension: Secondary | ICD-10-CM

## 2024-04-24 DIAGNOSIS — M79671 Pain in right foot: Secondary | ICD-10-CM

## 2024-04-24 DIAGNOSIS — M79641 Pain in right hand: Secondary | ICD-10-CM

## 2024-04-24 DIAGNOSIS — E538 Deficiency of other specified B group vitamins: Secondary | ICD-10-CM

## 2024-04-25 ENCOUNTER — Ambulatory Visit: Payer: Self-pay | Admitting: Physician Assistant

## 2024-04-25 ENCOUNTER — Ambulatory Visit

## 2024-04-25 ENCOUNTER — Ambulatory Visit: Admitting: Rheumatology

## 2024-04-25 DIAGNOSIS — M79642 Pain in left hand: Secondary | ICD-10-CM | POA: Diagnosis not present

## 2024-04-25 DIAGNOSIS — M79641 Pain in right hand: Secondary | ICD-10-CM

## 2024-04-25 DIAGNOSIS — R200 Anesthesia of skin: Secondary | ICD-10-CM

## 2024-04-25 NOTE — Progress Notes (Signed)
 Visit diagnosis: Pain in both hands  Limited ultrasound examination of bilateral hands was performed per EULAR recommendations. Using 15 MHz transducer, grayscale and power Doppler bilateral second and third MCP joints both dorsal and volar aspects were evaluated to look for synovitis or tenosynovitis. The findings were there was no synovitis or tenosynovitis on ultrasound examination.   Impression: No synovitis or tenosynovitis was noted on the limited ultrasound examination of both hands.  Ultrasound findings were discussed with the patient.  Megan Nash, MD

## 2024-04-25 NOTE — Progress Notes (Signed)
 ESR and CRP are elevated-recommend scheduling an ultrasound of both hands to assess for synovitis.

## 2024-04-27 ENCOUNTER — Other Ambulatory Visit (INDEPENDENT_AMBULATORY_CARE_PROVIDER_SITE_OTHER)

## 2024-04-27 DIAGNOSIS — E876 Hypokalemia: Secondary | ICD-10-CM | POA: Diagnosis not present

## 2024-04-27 LAB — URINALYSIS, ROUTINE W REFLEX MICROSCOPIC
Bilirubin Urine: NEGATIVE
Glucose, UA: NEGATIVE
Hgb urine dipstick: NEGATIVE
Ketones, ur: NEGATIVE
Leukocytes,Ua: NEGATIVE
Nitrite: NEGATIVE
Protein, ur: NEGATIVE
Specific Gravity, Urine: 1.014 (ref 1.001–1.035)
pH: 7.5 (ref 5.0–8.0)

## 2024-04-27 LAB — ANTI-SMOOTH MUSCLE ANTIBODY, IGG: Actin (Smooth Muscle) Antibody (IGG): <20 U (ref <20)

## 2024-04-27 LAB — ANTI-SCLERODERMA ANTIBODY: Scleroderma (Scl-70) (ENA) Antibody, IgG: <1 AI

## 2024-04-27 LAB — PROTEIN ELECTROPHORESIS, SERUM, WITH REFLEX
Albumin ELP: 4.6 g/dL (ref 3.8–4.8)
Alpha 1: 0.3 g/dL (ref 0.2–0.3)
Alpha 2: 0.8 g/dL (ref 0.5–0.9)
Beta 2: 0.4 g/dL (ref 0.2–0.5)
Beta Globulin: 0.6 g/dL (ref 0.4–0.6)
Gamma Globulin: 1.1 g/dL (ref 0.8–1.7)
Total Protein: 7.8 g/dL (ref 6.1–8.1)

## 2024-04-27 LAB — C3 AND C4
C3 Complement: 178 mg/dL (ref 83–193)
C4 Complement: 35 mg/dL (ref 15–57)

## 2024-04-27 LAB — MUTATED CITRULLINATED VIMENTIN (MCV) ANTIBODY: MUTATED CITRULLINATED VIMENTIN (MCV) AB: <20 U/mL

## 2024-04-27 LAB — SEDIMENTATION RATE: Sed Rate: 34 mm/h — ABNORMAL HIGH (ref 0–20)

## 2024-04-27 LAB — MITOCHONDRIAL ANTIBODIES: Mitochondrial M2 Ab, IgG: <=20 U (ref <=20.0)

## 2024-04-27 LAB — ANTI-NUCLEAR AB-TITER (ANA TITER): ANA Titer 1: 1:160 {titer} — ABNORMAL HIGH

## 2024-04-27 LAB — SJOGRENS SYNDROME-B EXTRACTABLE NUCLEAR ANTIBODY: SSB (La) (ENA) Antibody, IgG: <1 AI

## 2024-04-27 LAB — FOLATE: Folate: 7.8 ng/mL

## 2024-04-27 LAB — ANTI-SMITH ANTIBODY: ENA SM Ab Ser-aCnc: <1 AI

## 2024-04-27 LAB — ANTI-DNA ANTIBODY, DOUBLE-STRANDED: ds DNA Ab: <1 [IU]/mL

## 2024-04-27 LAB — C-REACTIVE PROTEIN: CRP: 12.5 mg/L — ABNORMAL HIGH (ref <8.0)

## 2024-04-27 LAB — ANA: Anti Nuclear Antibody (ANA): POSITIVE — AB

## 2024-04-27 LAB — CYCLIC CITRUL PEPTIDE ANTIBODY, IGG: Cyclic Citrullin Peptide Ab: <16 U

## 2024-04-27 LAB — RHEUMATOID FACTOR: Rheumatoid fact SerPl-aCnc: <10 [IU]/mL (ref <14)

## 2024-04-27 LAB — CK: Total CK: 162 U/L (ref 20–239)

## 2024-04-27 LAB — SJOGRENS SYNDROME-A EXTRACTABLE NUCLEAR ANTIBODY: SSA (Ro) (ENA) Antibody, IgG: <1 AI

## 2024-04-27 LAB — RNP ANTIBODY: Ribonucleic Protein(ENA) Antibody, IgG: <1 AI

## 2024-04-27 NOTE — Addendum Note (Signed)
 Addended by: TRUDY CURVIN RAMAN on: 04/27/2024 02:37 PM   Modules accepted: Orders

## 2024-04-27 NOTE — Addendum Note (Signed)
 Addended by: Arali Somera C on: 04/27/2024 08:55 AM   Modules accepted: Orders

## 2024-04-28 LAB — COMPREHENSIVE METABOLIC PANEL WITH GFR
AG Ratio: 1.7 (calc) (ref 1.0–2.5)
ALT: 15 U/L (ref 6–29)
AST: 17 U/L (ref 10–30)
Albumin: 4.5 g/dL (ref 3.6–5.1)
Alkaline phosphatase (APISO): 154 U/L — ABNORMAL HIGH (ref 31–125)
BUN: 11 mg/dL (ref 7–25)
CO2: 30 mmol/L (ref 20–32)
Calcium: 9.4 mg/dL (ref 8.6–10.2)
Chloride: 99 mmol/L (ref 98–110)
Creat: 0.74 mg/dL (ref 0.50–0.97)
Globulin: 2.7 g/dL (ref 1.9–3.7)
Glucose, Bld: 101 mg/dL — ABNORMAL HIGH (ref 65–99)
Potassium: 3.5 mmol/L (ref 3.5–5.3)
Sodium: 139 mmol/L (ref 135–146)
Total Bilirubin: 0.4 mg/dL (ref 0.2–1.2)
Total Protein: 7.2 g/dL (ref 6.1–8.1)
eGFR: 105 mL/min/1.73m2

## 2024-04-28 NOTE — Progress Notes (Signed)
 Plan to discuss results at [facility].   Please place referral to neurology for further evaluation of paresthesias affecting both hands.

## 2024-04-29 ENCOUNTER — Ambulatory Visit: Payer: Self-pay | Admitting: Family Medicine

## 2024-04-30 ENCOUNTER — Encounter: Payer: Self-pay | Admitting: Neurology

## 2024-05-11 ENCOUNTER — Encounter: Payer: Self-pay | Admitting: Family Medicine

## 2024-05-15 ENCOUNTER — Ambulatory Visit (INDEPENDENT_AMBULATORY_CARE_PROVIDER_SITE_OTHER)

## 2024-05-15 DIAGNOSIS — E538 Deficiency of other specified B group vitamins: Secondary | ICD-10-CM

## 2024-05-15 MED ORDER — CYANOCOBALAMIN 1000 MCG/ML IJ SOLN
1000.0000 ug | Freq: Once | INTRAMUSCULAR | Status: AC
  Administered 2024-05-15: 1000 ug via INTRAMUSCULAR

## 2024-05-15 NOTE — Progress Notes (Signed)
 Pt here for monthly B12 injection per original order dated: Harlene Jolly, DNP:  02/22/2024: Continue B12 injections as per current schedule.  Last B12 injection: 04/13/2024  Last B12 level: 04/19/2024   B12 1000mcg given IM, left deltoid and pt tolerated injection well.  Next B12 injection scheduled for: 06/15/2024

## 2024-05-22 ENCOUNTER — Other Ambulatory Visit: Payer: Self-pay | Admitting: Family Medicine

## 2024-05-22 DIAGNOSIS — M79641 Pain in right hand: Secondary | ICD-10-CM

## 2024-05-22 DIAGNOSIS — R200 Anesthesia of skin: Secondary | ICD-10-CM

## 2024-05-22 NOTE — Telephone Encounter (Signed)
 I called the patient to discuss lab results and ultrasound results.    ESR and CRP elevated and ANA has remained positive.  Patient is not scheduled for NCV with EMG until 08/01/24.  Can we call Dr. Lyda office at [facility] to see when his soonest availability for a nerve conduction study would be?      Patient declined a prednisone taper at this time.

## 2024-05-23 NOTE — Telephone Encounter (Signed)
 Please notify patient and place urgent referral to Dr. Cesario. Thank you!

## 2024-05-23 NOTE — Telephone Encounter (Signed)
 Contacted Dr. Sheria office and was advised the next opening currently for a nerve conduction study is 05/30/2024.

## 2024-05-23 NOTE — Telephone Encounter (Signed)
 Urgent referral placed

## 2024-05-23 NOTE — Telephone Encounter (Signed)
 Please see if another neuro office as sooner availability. We could also check with Dr. Cesario at [facility] ortho.

## 2024-05-23 NOTE — Telephone Encounter (Signed)
 Contacted Dr. Lyda office and spoke to Rosina Rosina states their machine is currently not working. Rosina states they are not taking any more referrals for nerve conduction studies currently because they are backed up and the estimated time frame was a couple of months.

## 2024-05-31 ENCOUNTER — Encounter: Payer: Self-pay | Admitting: Family Medicine

## 2024-06-01 MED ORDER — OSELTAMIVIR PHOSPHATE 75 MG PO CAPS: 75.0000 mg | ORAL_CAPSULE | Freq: Every day | ORAL | 0 refills | Status: AC

## 2024-06-04 NOTE — Progress Notes (Signed)
 "  Office Visit Note  Patient: Megan Hunt             Date of Birth: 1986/01/10           MRN: [id number]             PCP: Domenica Harlene LABOR, MD Referring: Domenica Harlene LABOR, MD Visit Date: 06/15/2024 Occupation: Data Unavailable  Subjective:  Discuss results  History of Present Illness: Megan Hunt is a 39 y.o. female with history of polyarthralgia and positive ANA.  Patient presents today to discuss results from the initial office visit and ultrasound procedure.  Patient states that she had an NCV with EMG performed on 06/13/2024 by Dr. Cesario of the right upper extremity.  Patient states that she was told that she has moderate carpal tunnel syndrome.  Patient has been wearing a carpal tunnel brace at night.  Her symptoms have been manageable.  She is not yet ready to proceed with a cortisone injection or surgical intervention.    Activities of Daily Living:  Patient reports morning stiffness for 10 minutes.   Patient Denies nocturnal pain.  Difficulty dressing/grooming: Reports Difficulty climbing stairs: Denies Difficulty getting out of chair: Denies Difficulty using hands for taps, buttons, cutlery, and/or writing: Reports  Review of Systems  Constitutional:  Positive for fatigue.  HENT:  Positive for mouth dryness. Negative for mouth sores.   Eyes:  Positive for dryness.  Respiratory:  Positive for shortness of breath.   Cardiovascular:  Positive for palpitations. Negative for chest pain.  Gastrointestinal:  Negative for blood in stool, constipation and diarrhea.  Endocrine: Negative for increased urination.  Genitourinary:  Negative for involuntary urination.  Musculoskeletal:  Positive for joint pain, joint pain, joint swelling, myalgias, muscle weakness, morning stiffness, muscle tenderness and myalgias. Negative for gait problem.  Skin:  Negative for color change, rash, hair loss and sensitivity to sunlight.  Allergic/Immunologic: Negative for susceptible to infections.   Neurological:  Positive for headaches. Negative for dizziness.  Hematological:  Positive for swollen glands.  Psychiatric/Behavioral:  Positive for depressed mood. Negative for sleep disturbance. The patient is nervous/anxious.     PMFS History:  Patient Active Problem List   Diagnosis Date Noted   Obesity (BMI 35.0-39.9 without comorbidity) 02/22/2024   Low serum vitamin B12 02/21/2024   Pruritus 08/12/2020   Pain in both wrists 08/12/2020   Hyperglycemia 01/30/2020   Hypocalcemia 01/30/2020   Vitamin D  deficiency 01/30/2020   Sinusitis 07/13/2018   Preventative health care 11/27/2017   Anemia 11/27/2017   Insomnia 05/15/2016   Fatigue 08/08/2014   Hemorrhoids, external 01/05/2011   Fibromyalgia 01/05/2011   Constipation 01/03/2010   HTN (hypertension) 09/29/2009   HYPERCHOLESTEROLEMIA 10/27/2008   ANXIETY DEPRESSION 10/27/2008   Allergic rhinitis 10/27/2008   Asthma, mild intermittent 10/27/2008   Backache 10/27/2008   CHEST PAIN, ATYPICAL 10/27/2008   Migraine 03/29/2007    Past Medical History:  Diagnosis Date   Allergic rhinitis    Anemia    Anxiety and depression    Asthma, mild intermittent 10/27/2008   Qualifier: Diagnosis of  By: Christi MD, Glendia CHRISTELLA    Atypical chest pain    Back pain    Constipation    History of migraines    Hypercholesteremia    Hypertension    no longer taking meds   Insomnia 05/15/2016   Somatic dysfunction     Family History  Problem Relation Age of Onset   Hypertension Father  Stroke Father        poor speech, weakness   Hypertension Mother    Arthritis Brother    Drug abuse Daughter    Stroke Maternal Grandfather    Hypertension Maternal Grandfather    Cancer Sister    Birth defects Paternal Aunt    Past Surgical History:  Procedure Laterality Date   TUBAL LIGATION Bilateral 11/28/2019   Procedure: POST PARTUM TUBAL LIGATION;  Surgeon: Eldonna Suzen Octave, MD;  Location: [facility];  Service: Gynecology;  Laterality:  Bilateral;   WISDOM TOOTH EXTRACTION     Social History[1] Social History   Social History Narrative   Son jordan born 02/2008 and daughter vicie born 06/2009   [name] born 03/22/2013     Immunization History  Administered Date(s) Administered   DTaP 01/01/2011   Influenza Whole 06/24/2009   Influenza,inj,Quad PF,6+ Mos 03/23/2013, 03/11/2015, 03/18/2016, 02/15/2017, 04/12/2022   PFIZER Comirnaty(Gray Top)Covid-19 Tri-Sucrose Vaccine 06/24/2020   PFIZER(Purple Top)SARS-COV-2 Vaccination 06/03/2020   Tdap 03/23/2013     Objective: Vital Signs: BP 118/76   Pulse 86   Temp 98.2 F (36.8 C)   Resp 14   Ht 5' 3 (1.6 m)   Wt 224 lb 3.2 oz (101.7 kg)   LMP 05/27/2024   BMI 39.72 kg/m    Physical Exam Vitals and nursing note reviewed.  Constitutional:      Appearance: She is well-developed.  HENT:     Head: Normocephalic and atraumatic.  Eyes:     Conjunctiva/sclera: Conjunctivae normal.  Cardiovascular:     Rate and Rhythm: Normal rate and regular rhythm.     Heart sounds: Normal heart sounds.  Pulmonary:     Effort: Pulmonary effort is normal.     Breath sounds: Normal breath sounds.  Abdominal:     General: Bowel sounds are normal.     Palpations: Abdomen is soft.  Musculoskeletal:     Cervical back: Normal range of motion.  Lymphadenopathy:     Cervical: No cervical adenopathy.  Skin:    General: Skin is warm and dry.     Capillary Refill: Capillary refill takes less than 2 seconds.  Neurological:     Mental Status: She is alert and oriented to person, place, and time.  Psychiatric:        Behavior: Behavior normal.      Musculoskeletal Exam: C-spine, thoracic spine, lumbar spine have good range of motion.  No midline spinal tenderness.  No SI joint tenderness.  Shoulder joints, elbow joints, wrist joints, MCPs, PIPs, DIPs have good range of motion with no synovitis.  Complete fist formation bilaterally.  Hip joints have good range of motion with no groin  pain.  Knee joints have good range of motion no warmth or effusion.  Ankle joints have good range of motion no tenderness or joint swelling.  No evidence of Achilles tendinitis or plantar fasciitis.   CDAI Exam: CDAI Score: -- Patient Global: --; Provider Global: -- Swollen: --; Tender: -- Joint Exam 06/15/2024   No joint exam has been documented for this visit   There is currently no information documented on the homunculus. Go to the Rheumatology activity and complete the homunculus joint exam.  Investigation: No additional findings.  Imaging: No results found.  Recent Labs: Lab Results  Component Value Date   WBC 10.1 04/23/2024   HGB 12.0 04/23/2024   PLT 338.0 04/23/2024   NA 139 04/27/2024   K 3.5 04/27/2024   CL 99 04/27/2024  CO2 30 04/27/2024   GLUCOSE 101 (H) 04/27/2024   BUN 11 04/27/2024   CREATININE 0.74 04/27/2024   BILITOT 0.4 04/27/2024   ALKPHOS 150 (H) 04/23/2024   AST 17 04/27/2024   ALT 15 04/27/2024   PROT 7.2 04/27/2024   ALBUMIN 4.5 04/23/2024   CALCIUM 9.4 04/27/2024   GFRAA >60 12/06/2019    Speciality Comments: No specialty comments available.  Procedures:  No procedures performed Allergies: Abilify  [aripiprazole ]   Assessment / Plan:     Visit Diagnoses: Polyarthralgia - 02/20/24: ANA 1:160 nuclear, dense fine speckled, ESR 20, CK 106, CRP 0.8, RF<10: Patient presented today to discuss x-ray and lab results from the initial consultation on 04/24/2024.  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  She had ultrasound of both hands on 04/25/2024 which was negative for synovitis and tenosynovitis.  Her ANA has remained positive and CRP and ESR were elevated but the rest of the rheumatologic workup was unremarkable. Plan to recheck sed rate and CRP today. According to the patient she had an updated NCV with EMG on 06/13/2024 which revealed moderate carpal tunnel syndrome of the right hand.  Different  treatment options were discussed today do feel.  She has been using carpal tunnel splint at night.  Discussed the option of scheduling ultrasound-guided right carpal tunnel injection versus a referral to hand specialist to discuss surgical intervention.  She is not ready to proceed with an injection or surgical release at this time.  She will notify us  if the symptoms persist or worsen.  Discussed signs and symptoms to monitor for closely for progression such as thenar muscle atrophy. Overall her symptoms have been manageable.  Discussed signs and symptoms to monitor for closely.  Raise the concern for systemic lupus.  Plan to recheck ANA and ENA panel prior to next visit. We will discuss results at next visit in 4 months.  - Plan: Sedimentation rate, C-reactive protein  Numbness in both hands -Right hand more consistent than left.  Underwent NCV with EMG on 06/13/24--moderate carpal tunnel right wrist per patient-no results to review yet.   She has been using carpal tunnel brace at night.  Discussed different treatment options including scheduling ultrasound-guided right carpal tunnel injection versus a referral to hand surgery to discuss surgical release.  She would like to hold off on an injection and surgical invention at this time.  She would notify us  if the symptoms persist or worsen.  Recommended monitoring for weakness and muscle atrophy.  Pain in both hands - She had ultrasound of both hands performed on 04/25/2024 which was negative for synovitis and tenosynovitis.  ESR and CRP were elevated on 04/24/2024.  Plan to recheck sed rate and CRP today.  RF negative, anti-CCP negative, and NCV negative on 04/24/2024.  She is continue to experience paresthesias affecting the right hand.  She was found to have moderate carpal tunnel syndrome of the right wrist on recent NCV with EMG performed by Dr. Nguyen.  She plans on continuing to use her night splint.  She does not want to proceed with an injection or  surgical intervention at this time.   No synovitis noted on examination today.    Myalgia:  CK WNL on 04/24/24.   Other fatigue: Chronic, stable.  She is taking a vitamin D  supplement 50000 once weekly.  Positive ANA (antinuclear antibody) - ANA 1: 160 nuclear, dense fine speckled: Reviewed lab results from 04/24/2024 today with the patient: ANA  1: 160 nuclear, dense fine speckled, CK within normal limits, anti-smooth muscle antibody negative, mitochondrial antibodies negative, MCV negative, anti-CCP negative, RF negative, complements within normal limits, double-stranded DNA negative, Ro antibody negative, La antibody negative, Smith antibody negative, RNP negative, SCL 70 negative, SPEP unremarkable, ESR 34, CRP 12.5.  No clinical features of systemic lupus at this time. Ultrasound of both hands obtained on 04/25/2024 was negative for synovitis and tenosynovitis. Plan to recheck sed rate and CRP today.  Plan to obtain a repeat ANA and ENA panel prior to the next scheduled follow-up visit in 4 months. Future orders will be placed today.   Elevated alkaline phosphatase level -alk phos was 195 on 02/22/2024.  Alk phos improved to 154 on 04/27/2024.  Plan to check alk phos isoenzymes today.    LFTs have remained WNL.  Plan: Alkaline Phosphatase Isoenzymes  Other medical conditions are listed as follows:  Primary hypertension: Blood pressure was 118/76 today in the office.  Mild intermittent asthma without complication  Non-seasonal allergic rhinitis due to pollen  Hx of migraines  Vitamin D  deficiency: She is taking vitamin D  50,000 units once weekly.   Low serum vitamin B12: Vitamin B12 was 607 on 04/19/2024.  Anxiety and depression  Orders: Orders Placed This Encounter  Procedures   Alkaline Phosphatase Isoenzymes   Sedimentation rate   C-reactive protein   No orders of the defined types were placed in this encounter.     Follow-Up Instructions: Return in about 4 months  (around 10/13/2024).   Megan CHRISTELLA Craze, Megan Hunt  Note - This record has been created using Dragon software.  Chart creation errors have been sought, but may not always  have been located. Such creation errors do not reflect on  the standard of medical care.     [1]  Social History Tobacco Use   Smoking status: Never    Passive exposure: Never   Smokeless tobacco: Never  Vaping Use   Vaping status: Never Used  Substance Use Topics   Alcohol use: No   Drug use: No   "

## 2024-06-14 ENCOUNTER — Ambulatory Visit

## 2024-06-14 DIAGNOSIS — E538 Deficiency of other specified B group vitamins: Secondary | ICD-10-CM

## 2024-06-14 MED ORDER — CYANOCOBALAMIN 1000 MCG/ML IJ SOLN
1000.0000 ug | Freq: Once | INTRAMUSCULAR | Status: AC
  Administered 2024-06-14: 1000 ug via INTRAMUSCULAR

## 2024-06-14 NOTE — Progress Notes (Signed)
 Pt here for monthly B12 injection per original order dated: Harlene Jolly, DNP:  02/22/2024: Continue B12 injections as per current schedule.  Last B12 injection: 05/15/2024  Last B12 level: 04/19/2024   B12 1000mcg given IM, left deltoid and pt tolerated injection well.  Next B12 injection scheduled for: 07/13/2024 @ 9:30 AM

## 2024-06-15 ENCOUNTER — Encounter: Payer: Self-pay | Admitting: Physician Assistant

## 2024-06-15 ENCOUNTER — Ambulatory Visit

## 2024-06-15 ENCOUNTER — Ambulatory Visit: Admitting: Physician Assistant

## 2024-06-15 VITALS — BP 118/76 | HR 86 | Temp 98.2°F | Resp 14 | Ht 63.0 in | Wt 224.2 lb

## 2024-06-15 DIAGNOSIS — R7689 Other specified abnormal immunological findings in serum: Secondary | ICD-10-CM

## 2024-06-15 DIAGNOSIS — M79641 Pain in right hand: Secondary | ICD-10-CM

## 2024-06-15 DIAGNOSIS — M791 Myalgia, unspecified site: Secondary | ICD-10-CM

## 2024-06-15 DIAGNOSIS — E538 Deficiency of other specified B group vitamins: Secondary | ICD-10-CM

## 2024-06-15 DIAGNOSIS — R200 Anesthesia of skin: Secondary | ICD-10-CM

## 2024-06-15 DIAGNOSIS — J4520 Mild intermittent asthma, uncomplicated: Secondary | ICD-10-CM

## 2024-06-15 DIAGNOSIS — E559 Vitamin D deficiency, unspecified: Secondary | ICD-10-CM

## 2024-06-15 DIAGNOSIS — M2550 Pain in unspecified joint: Secondary | ICD-10-CM

## 2024-06-15 DIAGNOSIS — J301 Allergic rhinitis due to pollen: Secondary | ICD-10-CM

## 2024-06-15 DIAGNOSIS — F32A Depression, unspecified: Secondary | ICD-10-CM

## 2024-06-15 DIAGNOSIS — I10 Essential (primary) hypertension: Secondary | ICD-10-CM

## 2024-06-15 DIAGNOSIS — Z8669 Personal history of other diseases of the nervous system and sense organs: Secondary | ICD-10-CM

## 2024-06-15 DIAGNOSIS — R5383 Other fatigue: Secondary | ICD-10-CM

## 2024-06-15 DIAGNOSIS — R748 Abnormal levels of other serum enzymes: Secondary | ICD-10-CM

## 2024-06-15 NOTE — Patient Instructions (Signed)
 Standing Labs We placed an order today for your standing lab work.   Please have your standing labs drawn in June   Please have your labs drawn 2 weeks prior to your appointment so that the provider can discuss your lab results at your appointment, if possible.  Please note that you may see your imaging and lab results in MyChart before we have reviewed them. We will contact you once all results are reviewed. Please allow our office up to 72 hours to thoroughly review all of the results before contacting the office for clarification of your results.  WALK-IN LAB HOURS  Monday through Thursday from 8:00 am - 4:00 pm and Friday from 8:00 am-12:00 pm.  Patients with office visits requiring labs will be seen before walk-in labs.  You may encounter longer than normal wait times. Please allow additional time. Wait times may be shorter on  Monday and Thursday afternoons.  We do not book appointments for walk-in labs. We appreciate your patience and understanding with our staff.   Labs are drawn by [facility]. Please bring your co-pay at the time of your lab draw.  You may receive a bill from [facility] for your lab work.  Please note if you are on Hydroxychloroquine and and an order has been placed for a Hydroxychloroquine level,  you will need to have it drawn 4 hours or more after your last dose.  If you wish to have your labs drawn at another location, please call the office 24 hours in advance so we can fax the orders.  The office is located at [address], [address], [place], [place] [address]   If you have any questions regarding directions or hours of operation,  please call [phone].   As a reminder, please drink plenty of water prior to coming for your lab work. Thanks!

## 2024-07-13 ENCOUNTER — Ambulatory Visit

## 2024-08-01 ENCOUNTER — Ambulatory Visit: Payer: Self-pay | Admitting: Neurology

## 2024-08-23 ENCOUNTER — Ambulatory Visit: Admitting: Family Medicine

## 2024-10-19 ENCOUNTER — Ambulatory Visit: Admitting: Physician Assistant
